# Patient Record
Sex: Female | Born: 1965
Health system: Southern US, Community
[De-identification: ages and names within clinical notes are randomized; demographics above are authoritative.]

## PROBLEM LIST (undated history)

## (undated) DIAGNOSIS — J189 Pneumonia, unspecified organism: Secondary | ICD-10-CM

## (undated) DIAGNOSIS — D649 Anemia, unspecified: Secondary | ICD-10-CM

## (undated) DIAGNOSIS — M069 Rheumatoid arthritis, unspecified: Secondary | ICD-10-CM

## (undated) DIAGNOSIS — O00109 Unspecified tubal pregnancy without intrauterine pregnancy: Secondary | ICD-10-CM

## (undated) DIAGNOSIS — A630 Anogenital (venereal) warts: Secondary | ICD-10-CM

## (undated) DIAGNOSIS — K219 Gastro-esophageal reflux disease without esophagitis: Secondary | ICD-10-CM

## (undated) DIAGNOSIS — H269 Unspecified cataract: Secondary | ICD-10-CM

## (undated) DIAGNOSIS — T7840XA Allergy, unspecified, initial encounter: Secondary | ICD-10-CM

## (undated) DIAGNOSIS — A64 Unspecified sexually transmitted disease: Secondary | ICD-10-CM

## (undated) DIAGNOSIS — R87619 Unspecified abnormal cytological findings in specimens from cervix uteri: Secondary | ICD-10-CM

## (undated) DIAGNOSIS — J45909 Unspecified asthma, uncomplicated: Secondary | ICD-10-CM

## (undated) DIAGNOSIS — F419 Anxiety disorder, unspecified: Secondary | ICD-10-CM

## (undated) DIAGNOSIS — F32A Depression, unspecified: Secondary | ICD-10-CM

## (undated) DIAGNOSIS — K649 Unspecified hemorrhoids: Secondary | ICD-10-CM

## (undated) DIAGNOSIS — D126 Benign neoplasm of colon, unspecified: Secondary | ICD-10-CM

## (undated) HISTORY — DX: Unspecified tubal pregnancy without intrauterine pregnancy: O00.109

## (undated) HISTORY — DX: Benign neoplasm of colon, unspecified: D12.6

## (undated) HISTORY — DX: Rheumatoid arthritis, unspecified: M06.9

## (undated) HISTORY — PX: POLYPECTOMY: SHX149

## (undated) HISTORY — DX: Depression, unspecified: F32.A

## (undated) HISTORY — DX: Unspecified cataract: H26.9

## (undated) HISTORY — PX: WISDOM TOOTH EXTRACTION: SHX21

## (undated) HISTORY — DX: Anxiety disorder, unspecified: F41.9

## (undated) HISTORY — DX: Anemia, unspecified: D64.9

## (undated) HISTORY — DX: Gastro-esophageal reflux disease without esophagitis: K21.9

## (undated) HISTORY — PX: ANKLE SURGERY: SHX546

## (undated) HISTORY — PX: COLONOSCOPY: SHX174

## (undated) HISTORY — DX: Allergy, unspecified, initial encounter: T78.40XA

## (undated) HISTORY — DX: Unspecified abnormal cytological findings in specimens from cervix uteri: R87.619

## (undated) HISTORY — DX: Unspecified asthma, uncomplicated: J45.909

## (undated) HISTORY — DX: Anogenital (venereal) warts: A63.0

## (undated) HISTORY — DX: Unspecified sexually transmitted disease: A64

## (undated) HISTORY — PX: HERNIA REPAIR: SHX51

## (undated) HISTORY — DX: Unspecified hemorrhoids: K64.9

## (undated) HISTORY — DX: Morbid (severe) obesity due to excess calories: E66.01

---

## 2003-06-03 ENCOUNTER — Encounter: Payer: Self-pay | Admitting: Rheumatology

## 2003-06-03 ENCOUNTER — Encounter: Admission: RE | Admit: 2003-06-03 | Discharge: 2003-06-03 | Payer: Self-pay | Admitting: Rheumatology

## 2003-10-17 DIAGNOSIS — M069 Rheumatoid arthritis, unspecified: Secondary | ICD-10-CM

## 2003-10-17 HISTORY — DX: Rheumatoid arthritis, unspecified: M06.9

## 2007-04-25 HISTORY — PX: TOTAL ABDOMINAL HYSTERECTOMY: SHX209

## 2007-04-25 HISTORY — PX: CHOLECYSTECTOMY: SHX55

## 2007-08-27 ENCOUNTER — Other Ambulatory Visit: Admission: RE | Admit: 2007-08-27 | Discharge: 2007-08-27 | Payer: Self-pay | Admitting: Obstetrics and Gynecology

## 2007-10-01 ENCOUNTER — Ambulatory Visit (HOSPITAL_BASED_OUTPATIENT_CLINIC_OR_DEPARTMENT_OTHER): Admission: RE | Admit: 2007-10-01 | Discharge: 2007-10-01 | Payer: Self-pay | Admitting: Obstetrics & Gynecology

## 2008-07-15 ENCOUNTER — Other Ambulatory Visit: Admission: RE | Admit: 2008-07-15 | Discharge: 2008-07-15 | Payer: Self-pay | Admitting: Obstetrics & Gynecology

## 2009-02-03 ENCOUNTER — Other Ambulatory Visit: Admission: RE | Admit: 2009-02-03 | Discharge: 2009-02-03 | Payer: Self-pay | Admitting: Obstetrics & Gynecology

## 2011-02-28 NOTE — Op Note (Signed)
NAMENATALLIE, RAVENSCROFT                ACCOUNT NO.:  [id number]   MEDICAL RECORD NO.:  [id number]          PATIENT TYPE:  AMB   LOCATION:  [facility]                         FACILITY:  [facility]   PHYSICIAN:  M. Leda Quail, MD  DATE OF BIRTH:  1966-07-17   DATE OF PROCEDURE:  10/01/2007  DATE OF DISCHARGE:                               OPERATIVE REPORT   PREOPERATIVE DIAGNOSES:  46. A 45 year old G2 P0, white female with vulvar vaginal condyloma.  2. Morbid obesity.  3. Rheumatoid arthritis.  4. History of total abdominal hysterectomy bilateral salpingo-      oophorectomy in July of this year secondary to a large benign      ovarian mass.   POSTOPERATIVE DIAGNOSES:  75. A 45 year old G2 P0, white female with vulvar vaginal condyloma.  2. Morbid obesity.  3. Rheumatoid arthritis.  4. History of total abdominal hysterectomy bilateral salpingo-      oophorectomy in July of this year secondary to a large benign      ovarian mass.   PROCEDURE:  CO2 laser of vaginal vulvar condyloma.   SURGEON:  M. Leda Quail, MD   ASSISTANT:  OR staff.   ANESTHESIA:  General endotracheal anesthesia.   FINDINGS:  Vaginal vulvar condyloma as described in the patient's office  notes, but specifically she has a small area of periurethral condyloma  on the right side.  It is not right on the urethra, it is separated  some.  She has several small areas of condyloma externally, and then a  patch of condyloma at the introitus.  Then she has some linear condyloma  vaginally all the way up to cuff where she has a second patch of  condyloma as well.   ESTIMATED BLOOD LOSS:  Minimal.   FLUIDS:  1000 mL of LR.   URINE OUTPUT:  50 mL.   COMPLICATIONS:  None.   INDICATIONS:  Ms. Nunzio Cobbs is a 45 year old white female who was seen after  many years in November.  On exam a thickened sort of whitish tissue was  noted vaginally and at the introitus.  This was biopsied, and it showed  condyloma.  Because of the  amount of the condyloma present.  I felt that  laser ablation initially was appropriate.  So the patient is probably  going to require future treatments, but hopefully we will be able to  minimize the amount of condyloma present; and then be able to proceed  with possibly vaginal Podophylin.  The patient has been counseled about  the risks and benefits, and is ready to proceed.   The patient was taken to the operating room.  She was placed in the  supine position.  Legs are positioned in the [place] stirrups.  An attempt  was made to place SCD compression devices because of her obesity.  But,  even after obtaining the bariatric SCDs this was unsuccessful.  A  decision was made to go ahead, and was placed in the lithotomy position  in the [place] stirrups, and the time of the clock was watched closely to  try and not exceed  45 minutes of surgery, if possible.   The patient's bladder was drained of urine, and then she was draped and  prepped in normal sterile fashion.  Wet and green towels were placed  around the vagina to help protect the skin.  Then the laser was attached  to the colposcope.  A painted speculum was used to visualize the  condyloma.  Acetic acid was applied to the vagina and the vulva for  several minutes.  Then the painted speculum with the suction attached  was placed vaginally.  The cuff was spread wide, and using a painting  motion with the laser tip, the condyloma cuff, and the vaginal sidewalls  were lasered.  A watt of 8 was used for the procedure.   Once the treatment of vaginal condyloma was completed, I moved to the  vulva.  Care was taken around the urethra to ensure that the laser did  not get any of the urethra.  There were several millimeters between the  urethra, and the condyloma that was removed.  Once the areas of  condyloma on the vulva were completely lasered, the procedure was ended.  The skin was cleansed with the Betadine prep.  The legs were placed  back  in the supine position.  Sponge, lap, and the instrument counts were  correct x2.  There were no needles used on the field.      Lum Keas, MD  Electronically Signed     MSM/MEDQ  D:  10/01/2007  T:  10/02/2007  Job:  [id number]

## 2011-07-21 LAB — I-STAT 8, (EC8 V) (CONVERTED LAB)
Acid-Base Excess: 2
Bicarbonate: 27.7 — ABNORMAL HIGH
Chloride: 106
HCT: 38
Hemoglobin: 12.9
Operator id: 268271
Potassium: 4.1
Sodium: 140
pCO2, Ven: 45
pH, Ven: 7.398 — ABNORMAL HIGH

## 2013-04-01 ENCOUNTER — Encounter: Payer: Self-pay | Admitting: Certified Nurse Midwife

## 2013-04-02 ENCOUNTER — Encounter: Payer: Self-pay | Admitting: Certified Nurse Midwife

## 2013-04-02 ENCOUNTER — Ambulatory Visit (INDEPENDENT_AMBULATORY_CARE_PROVIDER_SITE_OTHER): Payer: Self-pay | Admitting: Certified Nurse Midwife

## 2013-04-02 VITALS — BP 104/64 | HR 64 | Resp 16 | Ht 65.25 in | Wt 332.0 lb

## 2013-04-02 DIAGNOSIS — N951 Menopausal and female climacteric states: Secondary | ICD-10-CM

## 2013-04-02 DIAGNOSIS — Z0000 Encounter for general adult medical examination without abnormal findings: Secondary | ICD-10-CM

## 2013-04-02 DIAGNOSIS — Z01419 Encounter for gynecological examination (general) (routine) without abnormal findings: Secondary | ICD-10-CM

## 2013-04-02 LAB — POCT URINALYSIS DIPSTICK
Blood, UA: NEGATIVE
Glucose, UA: NEGATIVE
Ketones, UA: NEGATIVE
Protein, UA: NEGATIVE

## 2013-04-02 MED ORDER — ESTROGENS CONJUGATED 1.25 MG PO TABS: 1.2500 mg | ORAL_TABLET | Freq: Every day | ORAL | Status: DC

## 2013-04-02 NOTE — Progress Notes (Signed)
47 y.o. N5A2130 Single Caucasian Fe here for annual exam. Denies vaginal bleeding or dryness.  Patient was treated for pneumonia twice in past year.  Working on weight loss again with clean diet, reduction in processed foods. Denies any condyloma reoccurrence, but concerned partner may have some on his penis.  He has appointment to be checked. RA stable on medication.  Sees PCP for aex and all labs "normal per patient". Swelling legs the same no change.  No health issues today. No partner change, no STD concerns.  Patient's last menstrual period was 04/25/2007.          Sexually active: yes  The current method of family planning is status post hysterectomy.    Exercising: yes  weights & zumba Smoker:  no  Health Maintenance: Pap: 04/01/12 neg MMG:  12/13/11 normal Colonoscopy:  none BMD:   none TDaP:  2010 Labs: Poct urine-neg Self breast exam: monthly   reports that she has never smoked. She does not have any smokeless tobacco history on file. She reports that she drinks about 0.5 ounces of alcohol per week. She reports that she does not use illicit drugs.  Past Medical History  Diagnosis Date  . Morbid obesity   . Condyloma   . Rheumatoid arthritis 2005  . Tubal ectopic pregnancy     times 2  . STD (sexually transmitted disease)     + trich    Past Surgical History  Procedure Laterality Date  . Total abdominal hysterectomy  04/25/07    BSO  . Cholecystectomy  04/25/07    Current Outpatient Prescriptions  Medication Sig Dispense Refill  . Bumetanide (BUMEX PO) Take by mouth. occ      . Calcium Carbonate-Vitamin D (CALCIUM + D PO) Take by mouth daily.      Marland Kitchen CALCIUM PO Take by mouth.      . Cholecalciferol (VITAMIN D PO) Take by mouth daily.      Marland Kitchen CRANBERRY PO Take by mouth daily.      Marland Kitchen estrogens, conjugated, (PREMARIN) 1.25 MG tablet Take 1.25 mg by mouth daily.      Marland Kitchen Fexofenadine HCl (ALLEGRA PO) Take by mouth daily.      . Flaxseed, Linseed, (FLAXSEED OIL PO) Take by  mouth daily.      Marland Kitchen FOLIC ACID PO Take by mouth daily.      . methotrexate 1 G injection Inject into the vein every 7 (seven) days.      . Omega-3 Fatty Acids (FISH OIL PO) Take by mouth daily.      . Probiotic Product (PROBIOTIC PO) Take by mouth daily.       No current facility-administered medications for this visit.    Family History  Problem Relation Age of Onset  . Cancer Father   . Cirrhosis Father     ROS:  Pertinent items are noted in HPI.  Otherwise, a comprehensive ROS was negative.  Exam:   BP 104/64  Pulse 64  Resp 16  Ht 5' 5.25" (1.657 m)  Wt 332 lb (150.594 kg)  BMI 54.85 kg/m2  LMP 04/25/2007 Height: 5' 5.25" (165.7 cm)  Ht Readings from Last 3 Encounters:  04/02/13 5' 5.25" (1.657 m)    General appearance: alert, cooperative and appears stated age Head: Normocephalic, without obvious abnormality, atraumatic Neck: no adenopathy, supple, symmetrical, trachea midline and thyroid normal to inspection and palpation Lungs: clear to auscultation bilaterally Breasts: normal appearance, no masses or tenderness, No nipple retraction or dimpling,  No nipple discharge or bleeding, No axillary or supraclavicular adenopathy Heart: regular rate and rhythm Abdomen: soft, non-tender; no masses,  no organomegaly Extremities: extremities normal, atraumatic, no cyanosis or edema Skin: Skin color, texture, turgor normal. No rashes or lesions Lymph nodes: Cervical, supraclavicular, and axillary nodes normal. No abnormal inguinal nodes palpated Neurologic: Grossly normal   Pelvic: External genitalia:  no lesions              Urethra:  normal appearing urethra with no masses, tenderness or lesions              Bartholin's and Skene's: normal                 Vagina: normal appearing vagina with normal color and discharge, no lesions              Cervix: absent              Pap taken: no Bimanual Exam:  Uterus:  uterus absent              Adnexa: absent                Rectovaginal: Confirms               Anus:  normal sphincter tone, no lesions  A:  Well Woman with normal exam  S/P TAH with BSO for fibroids and cysts) on Premarin working well  Morbid obesity.  RA on stable medication with PCP  Lymphadema of legs  P: Reviewed health and wellness pertinent to exam  RX Premarin see order  Aware of health issues related to morbid obesity, does not want surgery. Continue to work with dieet and exercise as tolerated  Continue follow up as indicated.     Pap smear as per guidelines   mammogramyearly pap smear not taken today counseled on breast self exam, mammography screening, STD prevention, adequate intake of calcium and vitamin D, diet and exercise  return annually or prn  An After Visit Summary was printed and given to the patient.  Reviewed, TL

## 2013-04-02 NOTE — Patient Instructions (Addendum)

## 2013-04-21 ENCOUNTER — Telehealth: Payer: Self-pay | Admitting: Nurse Practitioner

## 2013-04-21 NOTE — Telephone Encounter (Signed)
Pt's boyfriend was diagnosed with something and she is not sure if she needs to come in to be seen

## 2013-04-21 NOTE — Telephone Encounter (Signed)
Left message on CB# Voice Mail of need to return call for more information and schedule office visit. sue

## 2013-04-22 NOTE — Telephone Encounter (Signed)
See note below and Office note in EPIC for 04/02/2013. Patient states that her boyfriend has gone to his doctor and was checked and was told he had Genital Warts . Patient is calling to let you know this information and what should she do if any Rx for her. Please advise.

## 2013-04-24 NOTE — Telephone Encounter (Signed)
Spoke with pt about Sarah Dean recommendation to come in for office visit to discuss /exam But if you have no noted areas on yourself then there is nothing to treat at this point. Pt was ok with this. Pt will keep A close check and if any areas of concern appear, she will call to schedule an appointment.

## 2013-04-24 NOTE — Telephone Encounter (Signed)
If patient has not noted any areas on herself there is nothing to treat at this point.  I will be glad to have her come in for OV exam to recheck and discuss.

## 2014-04-08 ENCOUNTER — Encounter: Payer: Self-pay | Admitting: Certified Nurse Midwife

## 2014-04-08 ENCOUNTER — Ambulatory Visit (INDEPENDENT_AMBULATORY_CARE_PROVIDER_SITE_OTHER): Payer: 59 | Admitting: Certified Nurse Midwife

## 2014-04-08 VITALS — BP 120/64 | HR 64 | Resp 16 | Ht 65.0 in | Wt 344.0 lb

## 2014-04-08 DIAGNOSIS — Z01419 Encounter for gynecological examination (general) (routine) without abnormal findings: Secondary | ICD-10-CM

## 2014-04-08 DIAGNOSIS — Z0000 Encounter for general adult medical examination without abnormal findings: Secondary | ICD-10-CM

## 2014-04-08 DIAGNOSIS — N951 Menopausal and female climacteric states: Secondary | ICD-10-CM

## 2014-04-08 MED ORDER — ESTROGENS CONJUGATED 1.25 MG PO TABS: 1.2500 mg | ORAL_TABLET | Freq: Every day | ORAL | Status: DC

## 2014-04-08 NOTE — Progress Notes (Signed)
48 y.o. G32P0020 Married Caucasian Fe here for annual exam.  Happily married now! Denies hot flashes or night sweats. Premarin working well. Patient seeing rheumatologist for RA. Lymphedema no change. Liver profile labs only at MD.office. PCP prn. Patient realizes weight has increased since marriage. Spouse and she are considering weight loss program. Patient is not interested in surgery at this point. No health issues today. Spouse works for [employer].  Patient's last menstrual period was 04/25/2007.          Sexually active: yes  The current method of family planning is status post hysterectomy.    Exercising: yes  zumba 3 times a week & gardening Smoker:  no  Health Maintenance: Pap:  03-22-12 neg MMG: 01-14-14 normal Colonoscopy:  none BMD:   none TDaP:  2010 Labs: none Self breast exam: done monthly   reports that she has quit smoking. She does not have any smokeless tobacco history on file. She reports that she drinks about one ounce of alcohol per week. She reports that she does not use illicit drugs.  Past Medical History  Diagnosis Date  . Morbid obesity   . Condyloma   . Rheumatoid arthritis 2005  . Tubal ectopic pregnancy     times 2  . STD (sexually transmitted disease)     + trich    Past Surgical History  Procedure Laterality Date  . Total abdominal hysterectomy  04/25/07    BSO  . Cholecystectomy  04/25/07    Current Outpatient Prescriptions  Medication Sig Dispense Refill  . Bumetanide (BUMEX PO) Take by mouth. occ      . Calcium Carbonate-Vitamin D (CALCIUM + D PO) Take by mouth daily.      . cetirizine (ZYRTEC) 10 MG tablet Take 10 mg by mouth daily.      . Cholecalciferol (VITAMIN D PO) Take by mouth daily.      Marland Kitchen CRANBERRY PO Take by mouth daily.      Marland Kitchen estrogens, conjugated, (PREMARIN) 1.25 MG tablet Take 1 tablet (1.25 mg total) by mouth daily.  30 tablet  12  . Flaxseed, Linseed, (FLAXSEED OIL PO) Take by mouth daily.      Marland Kitchen FOLIC ACID PO Take by mouth daily.       . methotrexate 1 G injection Inject into the vein every 7 (seven) days.      . milk thistle 175 MG tablet Take 175 mg by mouth as needed.      . Omega-3 Fatty Acids (FISH OIL PO) Take by mouth daily.      . Probiotic Product (PROBIOTIC PO) Take by mouth daily.       No current facility-administered medications for this visit.    Family History  Problem Relation Age of Onset  . Cancer Father   . Cirrhosis Father     ROS:  Pertinent items are noted in HPI.  Otherwise, a comprehensive ROS was negative.  Exam:   BP 120/64  Pulse 64  Resp 16  Ht 5\' 5"  (1.651 m)  Wt 344 lb (156.037 kg)  BMI 57.24 kg/m2  LMP 04/25/2007 Height: 5\' 5"  (165.1 cm)  Ht Readings from Last 3 Encounters:  04/08/14 5\' 5"  (1.651 m)  04/02/13 5' 5.25" (1.657 m)    General appearance: alert, cooperative and appears stated age Head: Normocephalic, without obvious abnormality, atraumatic Neck: no adenopathy, supple, symmetrical, trachea midline and thyroid normal to inspection and palpation and non-palpable Lungs: clear to auscultation bilaterally Breasts: normal appearance, no masses  or tenderness, No nipple retraction or dimpling, No nipple discharge or bleeding, No axillary or supraclavicular adenopathy Heart: regular rate and rhythm Abdomen: soft, non-tender; no masses,  no organomegaly Extremities: extremities normal, atraumatic, no cyanosis or edema Skin: Skin color, texture, turgor normal. No rashes or lesions Lymph nodes: Cervical, supraclavicular, and axillary nodes normal. No abnormal inguinal nodes palpated Neurologic: Grossly normal   Pelvic: External genitalia:  no lesions              Urethra:  normal appearing urethra with no masses, tenderness or lesions              Bartholin's and Skene's: normal                 Vagina: normal appearing vagina with normal color and discharge, no lesions              Cervix: absent              Pap taken: no Bimanual Exam:  Uterus:  uterus absent               Adnexa: no mass, fullness, tenderness and adnexa absent               Rectovaginal: Confirms               Anus:  normal sphincter tone, no lesions  A:  Well Woman with normal exam  S/P TAH with bilateral S and O on [name] working well  RA under stable medication with MD  Lymphedema no change  Screening Labs  Morbid obesity  P:   Reviewed health and wellness pertinent to exam  Continue follow up as indicated  Labs: Lipid panel, TSH, Vitamin D Hgb A1-c  Discussed using the [employer] weight loss program because her spouse is employee she has option to participate. Patient will inquire and consider. Encouraged to work on weight loss to prevent other health issues. Discussed surgical option, patient not interested.  Pap smear not taken today   counseled on breast self exam, mammography screening, adequate intake of calcium and vitamin D, diet and exercise  return annually or prn  An After Visit Summary was printed and given to the patient.

## 2014-04-08 NOTE — Patient Instructions (Signed)

## 2014-04-09 ENCOUNTER — Telehealth: Payer: Self-pay

## 2014-04-09 LAB — HEMOGLOBIN A1C
Hgb A1c MFr Bld: 5 % (ref <5.7)
Mean Plasma Glucose: 97 mg/dL (ref <117)

## 2014-04-09 LAB — LIPID PANEL
Cholesterol: 147 mg/dL (ref 0–200)
HDL: 57 mg/dL (ref >39)
LDL CALC: 60 mg/dL (ref 0–99)
TRIGLYCERIDES: 149 mg/dL (ref <150)
Total CHOL/HDL Ratio: 2.6 Ratio
VLDL: 30 mg/dL (ref 0–40)

## 2014-04-09 LAB — VITAMIN D 25 HYDROXY (VIT D DEFICIENCY, FRACTURES): Vit D, 25-Hydroxy: 63 ng/mL (ref 30–89)

## 2014-04-09 LAB — TSH: TSH: 3.089 u[IU]/mL (ref 0.350–4.500)

## 2014-04-09 NOTE — Telephone Encounter (Signed)
Returning call.

## 2014-04-09 NOTE — Telephone Encounter (Signed)
lmtcb

## 2014-04-09 NOTE — Telephone Encounter (Signed)
Patient notified of results as written by provider 

## 2014-04-09 NOTE — Telephone Encounter (Signed)
Message copied by Susy Manor on Thu Apr 09, 2014  3:21 PM ------      Message from: Regina Eck      Created: Thu Apr 09, 2014  8:13 AM       reviewed lab: Lipid panel great, Hgb A1-c, Vitamin D,TSH all normal      Notify patient ------

## 2014-04-13 NOTE — Progress Notes (Signed)
Reviewed personally.  M. Suzanne Miller, MD.  

## 2014-04-22 ENCOUNTER — Telehealth: Payer: Self-pay | Admitting: Certified Nurse Midwife

## 2014-04-22 NOTE — Telephone Encounter (Signed)
Pt checked with pharmacy [facility] pharmacy) and do not have the refill for her premarin.

## 2014-04-22 NOTE — Telephone Encounter (Addendum)
04/08/14 #30/12 refills was sent to [place]  S/w Caryl Pina with pharmacy she said they do have a refill ready for the patient she must've called from a old prescription bottle but they do have the prescription for her.  Notified patient.  Routed to provider for review, encounter closed.

## 2014-08-17 ENCOUNTER — Encounter: Payer: Self-pay | Admitting: Certified Nurse Midwife

## 2014-12-23 ENCOUNTER — Encounter: Payer: Self-pay | Admitting: Internal Medicine

## 2015-01-01 ENCOUNTER — Ambulatory Visit (INDEPENDENT_AMBULATORY_CARE_PROVIDER_SITE_OTHER): Payer: 59 | Admitting: Gastroenterology

## 2015-01-01 ENCOUNTER — Encounter: Payer: Self-pay | Admitting: Gastroenterology

## 2015-01-01 VITALS — BP 110/72 | HR 64 | Temp 97.1°F | Ht 66.0 in | Wt 343.6 lb

## 2015-01-01 DIAGNOSIS — K6289 Other specified diseases of anus and rectum: Secondary | ICD-10-CM | POA: Diagnosis not present

## 2015-01-01 NOTE — Patient Instructions (Signed)
[facility] is making a special cream to use twice a day for 2 weeks. This has nitroglycerin in it, so you need to wear gloves when applying to your rectum. Wash your hands afterward.   Please call me with an update on Monday.   We need to see you back in 2 weeks.

## 2015-01-01 NOTE — Progress Notes (Signed)
Primary Care Physician:  Monico Blitz, MD Primary Gastroenterologist:  Dr. Oneida Alar  Chief Complaint  Patient presents with  . Constipation  . hemorroid    HPI:   Sarah Dean is a 49 y.o. female presenting today at the request of Dr. [name] secondary to hemorrhoids, rectal pain. Presents as an urgent work-in.    Had issues with constipation after taking pain meds. Felt like a knife was stuck up her bottom. Thought maybe had an anal fissure, thought maybe had a tear. Significant rectal discomfort. Prescribed Lidocaine to help with pain. Medicating every 2 hours. Feels like a knife-like pain with BM. Suppository is painful. No rectal bleeding. Feels like rectum is swollen, "hanging out". Constipation resolved now. Feels like she has to have a BM all the time. Will go and have just a little come out, then gas. Gas bubble coming out of rectum is painful as well.    Past Medical History  Diagnosis Date  . Morbid obesity   . Condyloma   . Rheumatoid arthritis 2005  . Tubal ectopic pregnancy     times 2  . STD (sexually transmitted disease)     + trich    Past Surgical History  Procedure Laterality Date  . Total abdominal hysterectomy  04/25/07    BSO  . Cholecystectomy  04/25/07  . Ankle surgery      Current Outpatient Prescriptions  Medication Sig Dispense Refill  . Adalimumab (HUMIRA PEN [place]) Inject into the skin every 21 ( twenty-one) days.    . Bumetanide (BUMEX PO) Take by mouth. occ    . Calcium Carbonate-Vitamin D (CALCIUM + D PO) Take by mouth daily.    . cetirizine (ZYRTEC) 10 MG tablet Take 10 mg by mouth daily.    . Cholecalciferol (VITAMIN D PO) Take by mouth daily.    Marland Kitchen CRANBERRY PO Take by mouth daily.    Marland Kitchen estrogens, conjugated, (PREMARIN) 1.25 MG tablet Take 1 tablet (1.25 mg total) by mouth daily. 30 tablet 12  . Flaxseed, Linseed, (FLAXSEED OIL PO) Take by mouth daily.    Marland Kitchen FOLIC ACID PO Take by mouth daily.    . methotrexate 1 G injection Inject  into the vein every 7 (seven) days.    . milk thistle 175 MG tablet Take 175 mg by mouth as needed.    . Omega-3 Fatty Acids (FISH OIL PO) Take by mouth daily.    . Probiotic Product (PROBIOTIC PO) Take by mouth daily.     No current facility-administered medications for this visit.    Allergies as of 01/01/2015  . (No Known Allergies)    Family History  Problem Relation Age of Onset  . Cancer Father   . Cirrhosis Father   . Colon cancer Neg Hx     History   Social History  . Marital Status: Married    Spouse Name: N/A  . Number of Children: N/A  . Years of Education: N/A   Occupational History  . Not on file.   Social History Main Topics  . Smoking status: Former Research scientist (life sciences)  . Smokeless tobacco: Not on file  . Alcohol Use: No  . Drug Use: No  . Sexual Activity:    Partners: Male    Birth Control/ Protection: Surgical     Comment: TAH   Other Topics Concern  . Not on file   Social History Narrative    Review of Systems: As mentioned in HPI.  Physical Exam: BP  110/72 mmHg  Pulse 64  Temp(Src) 97.1 F (36.2 C)  Ht 5\' 6"  (1.676 m)  Wt 343 lb 9.6 oz (155.856 kg)  BMI 55.48 kg/m2  LMP 04/25/2007 General:   Alert and oriented. Pleasant and cooperative. Well-nourished and well-developed.  Head:  Normocephalic and atraumatic. Eyes:  Without icterus, sclera clear and conjunctiva pink.  Ears:  Normal auditory acuity. Nose:  No deformity, discharge,  or lesions. Mouth:  No deformity or lesions, oral mucosa pink.  Lungs:  Clear to auscultation bilaterally. No wheezes, rales, or rhonchi. No distress.  Heart:  S1, S2 present without murmurs appreciated.  Abdomen:  +BS, soft, non-tender and non-distended. No HSM noted. No guarding or rebound. No masses appreciated.  Rectal:  External exam without thrombosed hemorrhoids. Query occult anal fissure. Mild stooling of fecal matter, no gross blood. No internal exam performed.  Msk:  Symmetrical without gross deformities.  Normal posture. Extremities:  Without edema. Neurologic:  Alert and  oriented x4;  grossly normal neurologically. Skin:  Intact without significant lesions or rashes. Psych:  Alert and cooperative. Normal mood and affect.

## 2015-01-04 ENCOUNTER — Telehealth: Payer: Self-pay | Admitting: Internal Medicine

## 2015-01-04 NOTE — Telephone Encounter (Signed)
PATIENT CALLED AND STATED THAT THE CREAM IS WORKING ON THE HEMORRHOID PAIN BUT CAUSING HEADACHE.  ALSO A LITTLE DIZZINESS.  PLEASE ADVISE [phone]

## 2015-01-04 NOTE — Telephone Encounter (Signed)
Take tylenol as needed for headache, do not exceed recommended dosing on bottle. Wear gloves, wash hands afterward. Let me know if that helps.

## 2015-01-05 NOTE — Telephone Encounter (Signed)
Pt is aware and will try it and let us know if she continues to have problems.

## 2015-01-12 NOTE — Assessment & Plan Note (Signed)
49 year old female with likely anal fissure. Needs treatment with nitroglycerin cream in addition to hydrocortisone cream. Will have [facility] compound the hemorrhoid cream that includes lidocaine, add nitroglycerin, and hydrocortisone. Apply BID for 2-3 weeks. Return in 2 weeks for close follow-up. No colonoscopy indicated unless worsening or failure to improve. No evidence of rectal bleeding noted.

## 2015-01-13 ENCOUNTER — Ambulatory Visit (INDEPENDENT_AMBULATORY_CARE_PROVIDER_SITE_OTHER): Payer: 59 | Admitting: Gastroenterology

## 2015-01-13 ENCOUNTER — Encounter: Payer: Self-pay | Admitting: Gastroenterology

## 2015-01-13 VITALS — BP 126/82 | HR 104 | Temp 97.0°F | Ht 65.0 in | Wt 341.8 lb

## 2015-01-13 DIAGNOSIS — K6289 Other specified diseases of anus and rectum: Secondary | ICD-10-CM | POA: Diagnosis not present

## 2015-01-13 NOTE — Patient Instructions (Signed)
I sent in a different cream to the pharmacy. They should be able to show you on the applicator how much would quantify as an "inch" into rectum.   We will see you back in 4 weeks! Please call if you have any worsening of symptoms.

## 2015-01-13 NOTE — Progress Notes (Signed)
cc'ed to pcp °

## 2015-01-14 ENCOUNTER — Ambulatory Visit: Payer: Self-pay | Admitting: Gastroenterology

## 2015-01-21 NOTE — Progress Notes (Signed)
Referring Provider: Monico Blitz, MD Primary Care Physician:  Monico Blitz, MD  Primary GI: Dr. Oneida Alar   Chief Complaint  Patient presents with  . Follow-up    HPI:   Sarah Dean is a 49 y.o. female presenting today with a history of likely anal fissure. Early interval follow-up from 2 weeks ago. Prescribed [facility] ointment with nitro compounded at last appt. Doing significantly better. Rectal discomfort nearly resolved. No itching. No further fecal incontinence. NO rectal bleeding. Issues with nitro, noting significant dizziness and headache despite supportive measures.   Past Medical History  Diagnosis Date  . Morbid obesity   . Condyloma   . Rheumatoid arthritis 2005  . Tubal ectopic pregnancy     times 2  . STD (sexually transmitted disease)     + trich    Past Surgical History  Procedure Laterality Date  . Total abdominal hysterectomy  04/25/07    BSO  . Cholecystectomy  04/25/07  . Ankle surgery      Current Outpatient Prescriptions  Medication Sig Dispense Refill  . Adalimumab (HUMIRA PEN [place]) Inject into the skin every 21 ( twenty-one) days.    . Bumetanide (BUMEX PO) Take by mouth. occ    . Calcium Carbonate-Vitamin D (CALCIUM + D PO) Take by mouth daily.    . cetirizine (ZYRTEC) 10 MG tablet Take 10 mg by mouth daily.    . Cholecalciferol (VITAMIN D PO) Take by mouth daily.    Marland Kitchen CRANBERRY PO Take by mouth daily.    Marland Kitchen estrogens, conjugated, (PREMARIN) 1.25 MG tablet Take 1 tablet (1.25 mg total) by mouth daily. 30 tablet 12  . Flaxseed, Linseed, (FLAXSEED OIL PO) Take by mouth daily.    Marland Kitchen FOLIC ACID PO Take by mouth daily.    . methotrexate 1 G injection Inject into the vein every 7 (seven) days.    . milk thistle 175 MG tablet Take 175 mg by mouth as needed.    . Omega-3 Fatty Acids (FISH OIL PO) Take by mouth daily.    . Probiotic Product (PROBIOTIC PO) Take by mouth daily.     No current facility-administered medications for this visit.      Allergies as of 01/13/2015  . (No Known Allergies)    Family History  Problem Relation Age of Onset  . Cancer Father   . Cirrhosis Father   . Colon cancer Neg Hx     History   Social History  . Marital Status: Married    Spouse Name: N/A  . Number of Children: N/A  . Years of Education: N/A   Social History Main Topics  . Smoking status: Former Research scientist (life sciences)  . Smokeless tobacco: Not on file  . Alcohol Use: No  . Drug Use: No  . Sexual Activity:    Partners: Male    Birth Control/ Protection: Surgical     Comment: TAH   Other Topics Concern  . None   Social History Narrative    Review of Systems: As mentioned in HPI  Physical Exam: BP 126/82 mmHg  Pulse 104  Temp(Src) 97 F (36.1 C)  Ht 5\' 5"  (1.651 m)  Wt 341 lb 12.8 oz (155.039 kg)  BMI 56.88 kg/m2  LMP 04/25/2007 General:   Alert and oriented. No distress noted. Pleasant and cooperative.  Head:  Normocephalic and atraumatic. Eyes:  Conjuctiva clear without scleral icterus. Abdomen:  +BS, soft, obese, non-tender and non-distended. No rebound or guarding. No HSM or masses noted.  Rectal: small external hemorrhoids non-thrombosed, no obvious anal fissure, no stooling. Significantly improved from last visit. Internal exam with likely anterior small internal hemorrhoid.  Extremities:  lymphedema Neurologic:  Alert and  oriented x4;  grossly normal neurologically. Skin:  Intact without significant lesions or rashes. Psych:  Alert and cooperative. Normal mood and affect.

## 2015-01-21 NOTE — Assessment & Plan Note (Signed)
Likely secondary to occult anal fissure. Significant improvement noted with nitro and [facility] cream. Unable to tolerate nitro well. Will change to diltiazem compounded in [place] [facility] cream. Return in 4 weeks. No colonoscopy indicated unless no significant improvement or evidence of rectal bleeding.

## 2015-01-22 NOTE — Progress Notes (Signed)
cc'ed to pcp °

## 2015-02-02 ENCOUNTER — Other Ambulatory Visit: Payer: Self-pay | Admitting: Certified Nurse Midwife

## 2015-02-02 ENCOUNTER — Telehealth: Payer: Self-pay | Admitting: Internal Medicine

## 2015-02-02 NOTE — Telephone Encounter (Signed)
Medication refill request: Premarin  Last AEX:  04/08/14 DL Next AEX: 04/14/15 DL Last MMG (if hormonal medication request): 01/14/14 [id number]:Neg Refill authorized: 04/08/14 #30 tablets / 12 Refills to [place].

## 2015-02-02 NOTE — Telephone Encounter (Signed)
Routing to AS 

## 2015-02-02 NOTE — Telephone Encounter (Signed)
Pt called today saying that [facility] had put her on a special type of cream and she is running low and needs it before the weekend because she is going out of town. I told her to call her pharmacy and send [place] a refill request. She was saying that she would be in trouble if she ran out and wanted [facility] to call it in. I told her [facility] was seeing patients and that we ask for the patients to call their pharmacy if they are needing a refill. She said that she would try to reach someone at her pharmacy.

## 2015-02-02 NOTE — Telephone Encounter (Signed)
I called in the [place] [facility] cream with diltiazem gel added. Spoke with Nicki Reaper the pharmacist. Patient can check later today with them.

## 2015-02-16 ENCOUNTER — Encounter: Payer: Self-pay | Admitting: Gastroenterology

## 2015-02-16 ENCOUNTER — Ambulatory Visit (INDEPENDENT_AMBULATORY_CARE_PROVIDER_SITE_OTHER): Payer: 59 | Admitting: Gastroenterology

## 2015-02-16 VITALS — BP 119/78 | HR 88 | Temp 98.1°F | Ht 66.0 in | Wt 351.8 lb

## 2015-02-16 DIAGNOSIS — K6289 Other specified diseases of anus and rectum: Secondary | ICD-10-CM | POA: Diagnosis not present

## 2015-02-16 NOTE — Patient Instructions (Signed)
I will talk with Dr. Oneida Alar about the best route to go next.   Continue the cream for now. I will be in touch soon!

## 2015-02-16 NOTE — Progress Notes (Signed)
Referring Provider: Monico Blitz, MD Primary Care Physician:  Monico Blitz, MD  Primary GI: Dr. Oneida Alar   Chief Complaint  Patient presents with  . Hemorrhoids    HPI:   Sarah Dean is a 49 y.o. female presenting today with a history of likely anal fissure. Originally seen in march 2016 and prescribed [facility] cream with nitro compounded. Unable to tolerate nitro, so she was given another prescription with diltiazem compound. Overall, she has improved.   Cream burns after BM. No current rectal bleeding. No constipation or diarrhea. However, doesn't feel regular since symptoms started. Gets really itchy after having a BM. Remote hematochezia.   Past Medical History  Diagnosis Date  . Morbid obesity   . Condyloma   . Rheumatoid arthritis 2005  . Tubal ectopic pregnancy     times 2  . STD (sexually transmitted disease)     + trich    Past Surgical History  Procedure Laterality Date  . Total abdominal hysterectomy  04/25/07    BSO  . Cholecystectomy  04/25/07  . Ankle surgery      Current Outpatient Prescriptions  Medication Sig Dispense Refill  . Adalimumab (HUMIRA PEN [place]) Inject into the skin every 21 ( twenty-one) days.    . Bumetanide (BUMEX PO) Take by mouth. occ    . Calcium Carbonate-Vitamin D (CALCIUM + D PO) Take by mouth daily.    . cetirizine (ZYRTEC) 10 MG tablet Take 10 mg by mouth daily.    . Cholecalciferol (VITAMIN D PO) Take by mouth daily.    Marland Kitchen CRANBERRY PO Take by mouth daily.    Marland Kitchen estrogens, conjugated, (PREMARIN) 1.25 MG tablet Take 1 tablet (1.25 mg total) by mouth daily. 30 tablet 12  . Flaxseed, Linseed, (FLAXSEED OIL PO) Take by mouth daily.    Marland Kitchen FOLIC ACID PO Take by mouth daily.    . methotrexate 1 G injection Inject into the vein every 7 (seven) days.    . milk thistle 175 MG tablet Take 175 mg by mouth as needed.    . Omega-3 Fatty Acids (FISH OIL PO) Take by mouth daily.    . Probiotic Product (PROBIOTIC PO) Take by mouth  daily.     No current facility-administered medications for this visit.    Allergies as of 02/16/2015  . (No Known Allergies)    Family History  Problem Relation Age of Onset  . Cancer Father   . Cirrhosis Father   . Colon cancer Neg Hx     History   Social History  . Marital Status: Married    Spouse Name: N/A  . Number of Children: N/A  . Years of Education: N/A   Social History Main Topics  . Smoking status: Former Research scientist (life sciences)  . Smokeless tobacco: Not on file  . Alcohol Use: No  . Drug Use: No  . Sexual Activity:    Partners: Male    Birth Control/ Protection: Surgical     Comment: TAH   Other Topics Concern  . None   Social History Narrative    Review of Systems: As mentioned in HPI  Physical Exam: BP 119/78 mmHg  Pulse 88  Temp(Src) 98.1 F (36.7 C) (Oral)  Ht 5\' 6"  (1.676 m)  Wt 351 lb 12.8 oz (159.575 kg)  BMI 56.81 kg/m2  LMP 04/25/2007 General:   Alert and oriented. No distress noted. Pleasant and cooperative.  Head:  Normocephalic and atraumatic. Eyes:  Conjuctiva clear without scleral icterus. Mouth:  Oral mucosa pink and moist. Good dentition. No lesions. Heart:  S1, S2 present without murmurs, rubs, or gallops. Regular rate and rhythm. Abdomen:  +BS, soft, non-tender and non-distended. No rebound or guarding. No HSM or masses noted. Msk:  Symmetrical without gross deformities. Normal posture. Extremities:  Chronic lymphedema  Neurologic:  Alert and  oriented x4;  grossly normal neurologically. Skin:  Intact without significant lesions or rashes. Psych:  Alert and cooperative. Normal mood and affect.

## 2015-02-17 NOTE — Progress Notes (Addendum)
REVIEWED-NEEDS COMPLETE TCS WITH PHENERGAN 12.5 MG IV IN PREOP OR MAC.

## 2015-02-17 NOTE — Progress Notes (Signed)
REVIEWED-NO ADDITIONAL RECOMMENDATIONS. 

## 2015-02-18 ENCOUNTER — Telehealth: Payer: Self-pay | Admitting: Gastroenterology

## 2015-02-18 ENCOUNTER — Other Ambulatory Visit: Payer: Self-pay

## 2015-02-18 DIAGNOSIS — K648 Other hemorrhoids: Secondary | ICD-10-CM

## 2015-02-18 DIAGNOSIS — K6289 Other specified diseases of anus and rectum: Secondary | ICD-10-CM

## 2015-02-18 MED ORDER — PEG-KCL-NACL-NASULF-NA ASC-C 100 G PO SOLR: 1.0000 | ORAL | Status: DC

## 2015-02-18 NOTE — Telephone Encounter (Signed)
Pt is scheduled for June 8th and she is aware and instruction are in the mail

## 2015-02-18 NOTE — Telephone Encounter (Signed)
Per Dr. Oneida Alar, needs colonoscopy. Please arrange for TCS with possible banding.

## 2015-02-19 NOTE — Assessment & Plan Note (Signed)
49 year old female with likely anal fissure, treated with [place] [facility] hemorrhoid cream/diltiazem due to inability to tolerate nitro. No current rectal bleeding but remote history of hematochezia. Overall, she has improved since first visit but still with slow improvement. Discussed with Dr. Oneida Alar. Proceed with colonoscopy in near future.   Proceed with colonoscopy with Dr. Oneida Alar in the near future. The risks, benefits, and alternatives have been discussed in detail with the patient. They state understanding and desire to proceed.  Possible hemorrhoid banding at time of procedure if appropriate.

## 2015-02-24 NOTE — Progress Notes (Signed)
CC'ED TO PCP 

## 2015-03-04 ENCOUNTER — Other Ambulatory Visit: Payer: Self-pay

## 2015-03-04 DIAGNOSIS — K6289 Other specified diseases of anus and rectum: Secondary | ICD-10-CM

## 2015-03-23 ENCOUNTER — Telehealth: Payer: Self-pay | Admitting: General Practice

## 2015-03-23 NOTE — Telephone Encounter (Signed)
Patient called in to inform us they she is not sure if she can do the Enema for her tcs prep.  She stated she will try.

## 2015-03-24 ENCOUNTER — Ambulatory Visit (HOSPITAL_COMMUNITY): Admit: 2015-03-24 | Payer: 59 | Admitting: Gastroenterology

## 2015-03-24 ENCOUNTER — Encounter (HOSPITAL_COMMUNITY)
Admission: RE | Disposition: A | Discharge status: Home / Self Care | Payer: Self-pay | Source: Ambulatory Visit | Attending: Gastroenterology

## 2015-03-24 ENCOUNTER — Ambulatory Visit (HOSPITAL_COMMUNITY)
Admission: RE | Admit: 2015-03-24 | Discharge: 2015-03-24 | Disposition: A | Discharge status: Home / Self Care | Payer: 59 | Source: Ambulatory Visit | Attending: Gastroenterology | Admitting: Gastroenterology

## 2015-03-24 ENCOUNTER — Encounter (HOSPITAL_COMMUNITY): Payer: Self-pay

## 2015-03-24 ENCOUNTER — Encounter (HOSPITAL_COMMUNITY): Payer: Self-pay | Admitting: *Deleted

## 2015-03-24 DIAGNOSIS — Z79890 Hormone replacement therapy (postmenopausal): Secondary | ICD-10-CM | POA: Diagnosis not present

## 2015-03-24 DIAGNOSIS — K5730 Diverticulosis of large intestine without perforation or abscess without bleeding: Secondary | ICD-10-CM | POA: Insufficient documentation

## 2015-03-24 DIAGNOSIS — K644 Residual hemorrhoidal skin tags: Secondary | ICD-10-CM | POA: Diagnosis not present

## 2015-03-24 DIAGNOSIS — D124 Benign neoplasm of descending colon: Secondary | ICD-10-CM | POA: Diagnosis not present

## 2015-03-24 DIAGNOSIS — Z9049 Acquired absence of other specified parts of digestive tract: Secondary | ICD-10-CM | POA: Diagnosis not present

## 2015-03-24 DIAGNOSIS — K649 Unspecified hemorrhoids: Secondary | ICD-10-CM

## 2015-03-24 DIAGNOSIS — K6289 Other specified diseases of anus and rectum: Secondary | ICD-10-CM | POA: Diagnosis not present

## 2015-03-24 DIAGNOSIS — K648 Other hemorrhoids: Secondary | ICD-10-CM | POA: Insufficient documentation

## 2015-03-24 DIAGNOSIS — M6289 Other specified disorders of muscle: Secondary | ICD-10-CM | POA: Diagnosis not present

## 2015-03-24 DIAGNOSIS — Z6841 Body Mass Index (BMI) 40.0 and over, adult: Secondary | ICD-10-CM | POA: Diagnosis not present

## 2015-03-24 DIAGNOSIS — K625 Hemorrhage of anus and rectum: Secondary | ICD-10-CM | POA: Diagnosis present

## 2015-03-24 DIAGNOSIS — Z87891 Personal history of nicotine dependence: Secondary | ICD-10-CM | POA: Diagnosis not present

## 2015-03-24 DIAGNOSIS — K921 Melena: Secondary | ICD-10-CM

## 2015-03-24 DIAGNOSIS — Z79899 Other long term (current) drug therapy: Secondary | ICD-10-CM | POA: Insufficient documentation

## 2015-03-24 DIAGNOSIS — M069 Rheumatoid arthritis, unspecified: Secondary | ICD-10-CM | POA: Insufficient documentation

## 2015-03-24 HISTORY — PX: COLONOSCOPY: SHX5424

## 2015-03-24 HISTORY — PX: HEMORRHOID BANDING: SHX5850

## 2015-03-24 SURGERY — COLONOSCOPY
Anesthesia: Moderate Sedation

## 2015-03-24 MED ORDER — PROMETHAZINE HCL 25 MG/ML IJ SOLN
INTRAMUSCULAR | Status: AC
  Filled 2015-03-24: qty 1

## 2015-03-24 MED ORDER — SODIUM CHLORIDE 0.9 % IV SOLN
INTRAVENOUS | Status: DC
  Administered 2015-03-24: 09:00:00 via INTRAVENOUS

## 2015-03-24 MED ORDER — LIDOCAINE HCL 2 % EX GEL
CUTANEOUS | Status: AC
  Administered 2015-03-24: 1 via RECTAL
  Filled 2015-03-24: qty 30

## 2015-03-24 MED ORDER — MEPERIDINE HCL 100 MG/ML IJ SOLN
INTRAMUSCULAR | Status: DC | PRN
  Administered 2015-03-24 (×2): 25 mg via INTRAVENOUS
  Administered 2015-03-24: 50 mg via INTRAVENOUS

## 2015-03-24 MED ORDER — MEPERIDINE HCL 100 MG/ML IJ SOLN
INTRAMUSCULAR | Status: AC
  Filled 2015-03-24: qty 2

## 2015-03-24 MED ORDER — PROMETHAZINE HCL 25 MG/ML IJ SOLN
12.5000 mg | Freq: Once | INTRAMUSCULAR | Status: AC
Start: 2015-03-24 — End: 2015-03-24
  Administered 2015-03-24: 12.5 mg via INTRAVENOUS

## 2015-03-24 MED ORDER — STERILE WATER FOR IRRIGATION IR SOLN
Status: DC | PRN
  Administered 2015-03-24: 10:00:00

## 2015-03-24 MED ORDER — SODIUM CHLORIDE 0.9 % IJ SOLN
INTRAMUSCULAR | Status: AC
  Filled 2015-03-24: qty 3

## 2015-03-24 MED ORDER — MIDAZOLAM HCL 5 MG/5ML IJ SOLN
INTRAMUSCULAR | Status: AC
  Filled 2015-03-24: qty 10

## 2015-03-24 MED ORDER — TRAMADOL HCL 50 MG PO TABS: ORAL_TABLET | ORAL | Status: DC

## 2015-03-24 MED ORDER — MIDAZOLAM HCL 5 MG/5ML IJ SOLN
INTRAMUSCULAR | Status: DC | PRN
  Administered 2015-03-24 (×5): 2 mg via INTRAVENOUS

## 2015-03-24 NOTE — Discharge Instructions (Signed)
You have internal hemorrhoids. YOU had ONE POLYP removed. I PLACED A CLIP TO PREVENT BLEEDING IN 7-10 DAYS FROM YOUR POLYPECTOMY SITE. I PLACED 3 BANDS TO TREAT YOUR HEMORRHOIDAL PAIN/BLEEDING/SOILING. YOU MAY SOME MILD BLEEDING OVER THE NEXT 3 TO 5 DAYS.   NO MRI FOR 30 DAYS.  DRINK WATER TO KEEP URINE LIGHT YELLOW.  YOU MAY USE ULTRAM, NAPROXEN OR IBUPROFEN TWICE DAILY FOR RECTAL DISCOMFORT. TYLENOL AS NEED FOR ADDITIONAL PAIN RELIEF.  IF NEEDED USE COLACE TWICE DAILY TO SOFTEN STOOL.  YOUR BIOPSY RESULTS WILL BE AVAILABLE IN MY CHART AFTER JUN 10 AND MY OFFICE WILL CONTACT YOU IN 10-14 DAYS WITH YOUR RESULTS.   FOLLOW A HIGH FIBER DIET. AVOID ITEMS THAT CAUSE BLOATING & GAS. SEE INFO BELOW.  CALL IF YOU DECIDE TO SEE A SURGEON TO DISCUSS A HEMORRHOIDECTOMY.  FOLLOW UP IN 3 MOS.  Next colonoscopy in 3-5 years.    Colonoscopy Care After Read the instructions outlined below and refer to this sheet in the next week. These discharge instructions provide you with general information on caring for yourself after you leave the hospital. While your treatment has been planned according to the most current medical practices available, unavoidable complications occasionally occur. If you have any problems or questions after discharge, call DR. Achaia Garlock, [phone].  ACTIVITY  You may resume your regular activity, but move at a slower pace for the next 24 hours.   Take frequent rest periods for the next 24 hours.   Walking will help get rid of the air and reduce the bloated feeling in your belly (abdomen).   No driving for 24 hours (because of the medicine (anesthesia) used during the test).   You may shower.   Do not sign any important legal documents or operate any machinery for 24 hours (because of the anesthesia used during the test).    NUTRITION  Drink plenty of fluids.   You may resume your normal diet as instructed by your doctor.   Begin with a light meal and progress  to your normal diet. Heavy or fried foods are harder to digest and may make you feel sick to your stomach (nauseated).   Avoid alcoholic beverages for 24 hours or as instructed.    MEDICATIONS  You may resume your normal medications.   WHAT YOU CAN EXPECT TODAY  Some feelings of bloating in the abdomen.   Passage of more gas than usual.   Spotting of blood in your stool or on the toilet paper  .  IF YOU HAD POLYPS REMOVED DURING THE COLONOSCOPY:  Eat a soft diet IF YOU HAVE NAUSEA, BLOATING, ABDOMINAL PAIN, OR VOMITING.    FINDING OUT THE RESULTS OF YOUR TEST Not all test results are available during your visit. DR. Oneida Alar WILL CALL YOU WITHIN 14 DAYS OF YOUR PROCEDUE WITH YOUR RESULTS. Do not assume everything is normal if you have not heard from DR. Leydi Winstead, CALL HER OFFICE AT [phone].  SEEK IMMEDIATE MEDICAL ATTENTION AND CALL THE OFFICE: [phone] IF:  You have more than a spotting of blood in your stool.   Your belly is swollen (abdominal distention).   You are nauseated or vomiting.   You have a temperature over 101F.   You have abdominal pain or discomfort that is severe or gets worse throughout the day.   Hemorrhoids Hemorrhoids are dilated (enlarged) veins around the rectum. Sometimes clots will form in the veins. This makes them swollen and painful. These are called thrombosed hemorrhoids.  Causes of hemorrhoids include:  Constipation.   Straining to have a bowel movement.   HEAVY LIFTING  HOME CARE INSTRUCTIONS  Eat a well balanced diet and drink 6 to 8 glasses of water every day to avoid constipation. You may also use a bulk laxative.   Avoid straining to have bowel movements.   Keep anal area dry and clean.   Do not use a donut shaped pillow or sit on the toilet for long periods. This increases blood pooling and pain.   Move your bowels when your body has the urge; this will require less straining and will decrease pain and pressure.     High-Fiber Diet A high-fiber diet changes your normal diet to include more whole grains, legumes, fruits, and vegetables. Changes in the diet involve replacing refined carbohydrates with unrefined foods. The calorie level of the diet is essentially unchanged. The Dietary Reference Intake (recommended amount) for adult males is 38 grams per day. For adult females, it is 25 grams per day. Pregnant and lactating women should consume 28 grams of fiber per day. Fiber is the intact part of a plant that is not broken down during digestion. Functional fiber is fiber that has been isolated from the plant to provide a beneficial effect in the body.  PURPOSE  Increase stool bulk.   Ease and regulate bowel movements.   Lower cholesterol.   REDUCE RISK OF COLON CANCER  INDICATIONS THAT YOU NEED MORE FIBER  Constipation and hemorrhoids.   Uncomplicated diverticulosis (intestine condition) and irritable bowel syndrome.   Weight management.   As a protective measure against hardening of the arteries (atherosclerosis), diabetes, and cancer.   GUIDELINES FOR INCREASING FIBER IN THE DIET  Start adding fiber to the diet slowly. A gradual increase of about 5 more grams (2 slices of whole-wheat bread, 2 servings of most fruits or vegetables, or 1 bowl of high-fiber cereal) per day is best. Too rapid an increase in fiber may result in constipation, flatulence, and bloating.   Drink enough water and fluids to keep your urine clear or pale yellow. Water, juice, or caffeine-free drinks are recommended. Not drinking enough fluid may cause constipation.   Eat a variety of high-fiber foods rather than one type of fiber.   Try to increase your intake of fiber through using high-fiber foods rather than fiber pills or supplements that contain small amounts of fiber.   The goal is to change the types of food eaten. Do not supplement your present diet with high-fiber foods, but replace foods in your present  diet.   INCLUDE A VARIETY OF FIBER SOURCES  Replace refined and processed grains with whole grains, canned fruits with fresh fruits, and incorporate other fiber sources. White rice, white breads, and most bakery goods contain little or no fiber.   Brown whole-grain rice, buckwheat oats, and many fruits and vegetables are all good sources of fiber. These include: broccoli, Brussels sprouts, cabbage, cauliflower, beets, sweet potatoes, white potatoes (skin on), carrots, tomatoes, eggplant, squash, berries, fresh fruits, and dried fruits.   Cereals appear to be the richest source of fiber. Cereal fiber is found in whole grains and bran. Bran is the fiber-rich outer coat of cereal grain, which is largely removed in refining. In whole-grain cereals, the bran remains. In breakfast cereals, the largest amount of fiber is found in those with "bran" in their names. The fiber content is sometimes indicated on the label.   You may need to include additional fruits and  vegetables each day.   In baking, for 1 cup white flour, you may use the following substitutions:   1 cup whole-wheat flour minus 2 tablespoons.   1/2 cup white flour plus 1/2 cup whole-wheat flour.   Polyps, Colon  A polyp is extra tissue that grows inside your body. Colon polyps grow in the large intestine. The large intestine, also called the colon, is part of your digestive system. It is a long, hollow tube at the end of your digestive tract where your body makes and stores stool. Most polyps are not dangerous. They are benign. This means they are not cancerous. But over time, some types of polyps can turn into cancer. Polyps that are smaller than a pea are usually not harmful. But larger polyps could someday become or may already be cancerous. To be safe, doctors remove all polyps and test them.   PREVENTION There is not one sure way to prevent polyps. You might be able to lower your risk of getting them if you:  Eat more fruits and  vegetables and less fatty food.   Do not smoke.   Avoid alcohol.   Exercise every day.   Lose weight if you are overweight.   Eating more calcium and folate can also lower your risk of getting polyps. Some foods that are rich in calcium are milk, cheese, and broccoli. Some foods that are rich in folate are chickpeas, kidney beans, and spinach.

## 2015-03-24 NOTE — Op Note (Signed)
[facility] [address] [place], [address]   COLONOSCOPY PROCEDURE REPORT  PATIENT: Sarah Dean, Sarah Dean  MR#: [id number] BIRTHDATE: August 05, 1966 , 49  yrs. old GENDER: female ENDOSCOPIST: Danie Binder, MD REFERRED DD:[id number] [name], M.D. PROCEDURE DATE:  2015-04-21 PROCEDURE:   Colonoscopy with snare polypectomy and Hemorrhoidectomy via banding, clips or ligation INDICATIONS:rectal bleeding and RECTAL PAIN. MEDICATIONS: Demerol 100 mg IV and Versed 10 mg IV  DESCRIPTION OF PROCEDURE:    Physical exam was performed.  Informed consent was obtained from the patient after explaining the benefits, risks, and alternatives to procedure.  The patient was connected to monitor and placed in left lateral position. Continuous oxygen was provided by nasal cannula and IV medicine administered through an indwelling cannula.  After administration of sedation and rectal exam, the patients rectum was intubated and the EC-[id number] ([id number])  colonoscope was advanced under direct visualization to the ileum.  The scope was removed slowly by carefully examining the color, texture, anatomy, and integrity mucosa on the way out.  The patient was recovered in endoscopy and discharged home in satisfactory condition.    COLON FINDINGS: A flat polyp ranging from 7 to 36mm in size was found in the descending colon.  A polypectomy was performed using snare cautery.  The wound at the site was closed by placing hemoclips. Two (2) placements were made.  Two (2) placements were made, There was moderate diverticulosis noted in the sigmoid colon with associated muscular hypertrophy.  , Moderate sized internal hemorrhoids were found.  3 BANDS APPLIED. Large external hemorrhoids were found.  PREP QUALITY: excellent.  CECAL W/D TIME: 17       minutes COMPLICATIONS: rectal pain and discomfort after banding. 2 BANDS REMOVED AND 1 REMAINS. PT D/C RECTAL PAIN IMPROVED.  ENDOSCOPIC IMPRESSION: 1.   ONE  COLON POLYP REMOVED 2.   Moderate diverticulosis in the sigmoid colon 3.   Moderate sized internal hemorrhoids 4.   Large external hemorrhoids  RECOMMENDATIONS: NO MRI FOR 30 DAYS. DRINK WATER TO KEEP URINE LIGHT YELLOW. USE ULTRAM, NAPROXEN OR IBUPROFEN TWICE DAILY FOR RECTAL DISCOMFORT. TYLENOL AS NEED FOR ADDITIONAL PAIN RELIEF. IF NEEDED USE COLACE TWICE DAILY TO SOFTEN STOOL. AWAIT BIOPSY RESULTS. FOLLOW A HIGH FIBER DIET.  AVOID ITEMS THAT CAUSE BLOATING & GAS. PT WILL CALL IF SHE DECIDES TO SEE A SURGEON TO DISCUSS A HEMORRHOIDECTOMY. FOLLOW UP IN 3 MOS. Next colonoscopy in 3-5 years.  eSigned:  Danie Binder, MD April 21, 2015 5:49 PM CPT CODES: ICD CODES:  The ICD and CPT codes recommended by this software are interpretations from the data that the clinical staff has captured with the software.  The verification of the translation of this report to the ICD and CPT codes and modifiers is the sole responsibility of the health care institution and practicing physician where this report was generated.  [employer] will not be held responsible for the validity of the ICD and CPT codes included on this report.  AMA assumes no liability for data contained or not contained herein. CPT is a Designer, television/film set of the [employer].

## 2015-03-24 NOTE — H&P (Signed)
Primary Care Physician:  Monico Blitz, MD Primary Gastroenterologist:  Dr. Oneida Alar  Pre-Procedure History & Physical: HPI:  Sarah Dean is a 49 y.o. female here for BRBPR/RECTAL PAIN.  Past Medical History  Diagnosis Date  . Morbid obesity   . Condyloma   . Rheumatoid arthritis 2005  . Tubal ectopic pregnancy     times 2  . STD (sexually transmitted disease)     + trich    Past Surgical History  Procedure Laterality Date  . Total abdominal hysterectomy  04/25/07    BSO  . Cholecystectomy  04/25/07  . Ankle surgery      Prior to Admission medications   Medication Sig Start Date End Date Taking? Authorizing Provider  Adalimumab (HUMIRA PEN [place]) Inject into the skin every 21 ( twenty-one) days.   Yes Historical Provider, MD  Bumetanide (BUMEX PO) Take 1 tablet by mouth daily as needed (fluid). occ   Yes Historical Provider, MD  Calcium Carbonate-Vitamin D (CALCIUM + D PO) Take 1 tablet by mouth daily.    Yes Historical Provider, MD  cetirizine (ZYRTEC) 10 MG tablet Take 10 mg by mouth daily.   Yes Historical Provider, MD  Cholecalciferol (VITAMIN D PO) Take 1 tablet by mouth daily.    Yes Historical Provider, MD  CRANBERRY PO Take 1 tablet by mouth daily.    Yes Historical Provider, MD  estrogens, conjugated, (PREMARIN) 1.25 MG tablet Take 1 tablet (1.25 mg total) by mouth daily. 04/08/14  Yes Regina Eck, CNM  Flaxseed, Linseed, (FLAXSEED OIL PO) Take 1 capsule by mouth daily.    Yes Historical Provider, MD  folic acid (FOLVITE) 1 MG tablet Take 1 mg by mouth daily.   Yes Historical Provider, MD  methotrexate 1 G injection Inject into the vein every 7 (seven) days.   Yes Historical Provider, MD  milk thistle 175 MG tablet Take 175 mg by mouth daily.    Yes Historical Provider, MD  Omega-3 Fatty Acids (FISH OIL PO) Take 2 capsules by mouth daily.    Yes Historical Provider, MD  peg 3350 powder (MOVIPREP) 100 G SOLR Take 1 kit (200 g total) by mouth as directed. 02/18/15   Yes Danie Binder, MD  Probiotic Product (PROBIOTIC PO) Take 1 capsule by mouth daily.    Yes Historical Provider, MD    Allergies as of 03/04/2015  . (No Known Allergies)    Family History  Problem Relation Age of Onset  . Cancer Father   . Cirrhosis Father   . Colon cancer Neg Hx     History   Social History  . Marital Status: Married    Spouse Name: N/A  . Number of Children: N/A  . Years of Education: N/A   Occupational History  . Not on file.   Social History Main Topics  . Smoking status: Former Research scientist (life sciences)  . Smokeless tobacco: Not on file  . Alcohol Use: No  . Drug Use: No  . Sexual Activity:    Partners: Male    Birth Control/ Protection: Surgical     Comment: TAH   Other Topics Concern  . Not on file   Social History Narrative    Review of Systems: See HPI, otherwise negative ROS   Physical Exam: BP 154/97 mmHg  Pulse 81  Temp(Src) 97.5 F (36.4 C) (Oral)  Resp 20  Ht $R'5\' 6"'mi$  (1.676 m)  Wt 351 lb (159.213 kg)  BMI 56.68 kg/m2  SpO2 96%  LMP 04/25/2007 General:  Alert,  pleasant and cooperative in NAD Head:  Normocephalic and atraumatic. Neck:  Supple; Lungs:  Clear throughout to auscultation.    Heart:  Regular rate and rhythm. Abdomen:  Soft, nontender and nondistended. Normal bowel sounds, without guarding, and without rebound.   Neurologic:  Alert and  oriented x4;  grossly normal neurologically.  Impression/Plan:    BRBPR/RECTAL PAIN  PLAN: [facility]/?HEMORRHOID BANDING TODAY

## 2015-03-26 ENCOUNTER — Encounter (HOSPITAL_COMMUNITY): Payer: Self-pay | Admitting: Gastroenterology

## 2015-04-05 ENCOUNTER — Telehealth: Payer: Self-pay | Admitting: Gastroenterology

## 2015-04-05 NOTE — Telephone Encounter (Signed)
OV MADE AND REMINDER IN EPIC

## 2015-04-05 NOTE — Telephone Encounter (Signed)
LMOM to call.

## 2015-04-05 NOTE — Telephone Encounter (Signed)
Please call pt. She had a simple adenoma removed.   NO MRI UNTIL JUL 8.  DRINK WATER TO KEEP URINE LIGHT YELLOW.  FOLLOW A HIGH FIBER DIET. AVOID ITEMS THAT CAUSE BLOATING & GAS.   CALL IF YOU DECIDE TO SEE A SURGEON TO DISCUSS A HEMORRHOIDECTOMY.  FOLLOW UP IN 3 MOS E30 RECTAL BLEEDING/PAIN/LARGE EXTERNAL HEMORRHOIDS  Next colonoscopy in 5-10 years.

## 2015-04-06 NOTE — Telephone Encounter (Signed)
Pt is aware of results. 

## 2015-04-12 ENCOUNTER — Other Ambulatory Visit: Payer: Self-pay | Admitting: Certified Nurse Midwife

## 2015-04-12 NOTE — Telephone Encounter (Addendum)
Medication refill request: Premarin 1.25 mg Last AEX:  04/09/15 with DL Next AEX: 04/14/15 with DL Last MMG (if hormonal medication request): 01/14/14 bi-rads 1: Negative  Refill authorized: #[id number] rfs, please advise  (routed to PG since DL is out of office)

## 2015-04-14 ENCOUNTER — Ambulatory Visit (INDEPENDENT_AMBULATORY_CARE_PROVIDER_SITE_OTHER): Payer: 59 | Admitting: Certified Nurse Midwife

## 2015-04-14 ENCOUNTER — Encounter: Payer: Self-pay | Admitting: Certified Nurse Midwife

## 2015-04-14 VITALS — BP 112/70 | HR 88 | Resp 18 | Ht 65.0 in | Wt 368.0 lb

## 2015-04-14 DIAGNOSIS — N951 Menopausal and female climacteric states: Secondary | ICD-10-CM | POA: Diagnosis not present

## 2015-04-14 DIAGNOSIS — Z0000 Encounter for general adult medical examination without abnormal findings: Secondary | ICD-10-CM

## 2015-04-14 DIAGNOSIS — N390 Urinary tract infection, site not specified: Secondary | ICD-10-CM

## 2015-04-14 DIAGNOSIS — Z01419 Encounter for gynecological examination (general) (routine) without abnormal findings: Secondary | ICD-10-CM

## 2015-04-14 LAB — POCT URINALYSIS DIPSTICK
GLUCOSE UA: NEGATIVE
Ketones, UA: NEGATIVE
Nitrite, UA: NEGATIVE
Urobilinogen, UA: 1
pH, UA: 5

## 2015-04-14 MED ORDER — PHENAZOPYRIDINE HCL 200 MG PO TABS: 200.0000 mg | ORAL_TABLET | Freq: Three times a day (TID) | ORAL | Status: DC | PRN

## 2015-04-14 MED ORDER — ESTROGENS CONJUGATED 1.25 MG PO TABS: 1.2500 mg | ORAL_TABLET | Freq: Every day | ORAL | Status: DC

## 2015-04-14 NOTE — Progress Notes (Signed)
49 y.o. G34P0020 Married  Caucasian Fe here for annual exam. Denies vaginal bleeding or vaginal dryness. Continues to urinary frequency and discomfort, was treated for UTI with 7 days of antibiotic and given ? Sulfa medication. Rough year wth sciatic nerve on left and had limited mobility with weight gain. Also had rectal bleeding with colonoscopy and hemorrhoids banding and fissure. Premarin working well ,no hot flashes.  RA also flaring, sees Rheumatology for management and labs. No other health concerns today.  Patient's last menstrual period was 04/25/2007.          Sexually active: Yes.    The current method of family planning is status post hysterectomy.    Exercising: No.  The patient does not participate in regular exercise at present. Smoker:  no  Health Maintenance: Pap: 03/22/12 Neg MMG:  01/14/14 BIRADS1:Neg was done 4/16 neg. Self Breast Exam: yes, sometimes  Colonoscopy: 03/24/15 Polyps - repeat 3-5 years  BMD:   none TDaP:  2010 Labs: PCP UA: Bilirubin +, RBC=Mod, WBC=Mod, Protein=Trace.   reports that she has quit smoking. She has never used smokeless tobacco. She reports that she drinks about 1.2 oz of alcohol per week. She reports that she does not use illicit drugs.  Past Medical History  Diagnosis Date  . Morbid obesity   . Condyloma   . Rheumatoid arthritis 2005  . Tubal ectopic pregnancy     times 2  . STD (sexually transmitted disease)     + trich    Past Surgical History  Procedure Laterality Date  . Total abdominal hysterectomy  04/25/07    BSO  . Cholecystectomy  04/25/07  . Ankle surgery    . Colonoscopy N/A 03/24/2015    Procedure: COLONOSCOPY;  Surgeon: Danie Binder, MD;  [facility];  Service: Endoscopy;  Laterality: N/A;  1030 - moved to 9:45 - Ginger notified pt  . Hemorrhoid banding N/A 03/24/2015    Procedure: HEMORRHOID BANDING;  Surgeon: Danie Binder, MD;  Location: [facility];  Service: Endoscopy;  Laterality: N/A;    Current  Outpatient Prescriptions  Medication Sig Dispense Refill  . Adalimumab (HUMIRA PEN [place]) Inject into the skin every 21 ( twenty-one) days.    . Bumetanide (BUMEX PO) Take 1 tablet by mouth daily as needed (fluid). occ    . Calcium Carbonate-Vitamin D (CALCIUM + D PO) Take 1 tablet by mouth daily.     . cetirizine (ZYRTEC) 10 MG tablet Take 10 mg by mouth daily.    . Cholecalciferol (VITAMIN D PO) Take 1 tablet by mouth daily.     Marland Kitchen CRANBERRY PO Take 1 tablet by mouth daily.     . Flaxseed, Linseed, (FLAXSEED OIL PO) Take 1 capsule by mouth daily.     . folic acid (FOLVITE) 1 MG tablet Take 1 mg by mouth daily.    . methotrexate 1 G injection Inject into the vein every 7 (seven) days.    . milk thistle 175 MG tablet Take 175 mg by mouth daily.     . Omega-3 Fatty Acids (FISH OIL PO) Take 2 capsules by mouth daily.     Marland Kitchen PREMARIN 1.25 MG tablet TAKE 1 TABLET BY MOUTH DAILY. 30 tablet 0  . Probiotic Product (PROBIOTIC PO) Take 1 capsule by mouth daily.     . traMADol (ULTRAM) 50 MG tablet 1 po q6h prn pain 20 tablet 0   No current facility-administered medications for this visit.    Family  History  Problem Relation Age of Onset  . Cancer Father   . Cirrhosis Father   . Colon cancer Neg Hx     ROS:  Pertinent items are noted in HPI.  Otherwise, a comprehensive ROS was negative.  Exam:   BP 112/70 mmHg  Pulse 88  Resp 18  Ht 5\' 5"  (1.651 m)  Wt 368 lb (166.924 kg)  BMI 61.24 kg/m2  LMP 04/25/2007 Height: 5\' 5"  (165.1 cm) Ht Readings from Last 3 Encounters:  04/14/15 5\' 5"  (1.651 m)  03/24/15 5\' 6"  (1.676 m)  02/16/15 5\' 6"  (1.676 m)    General appearance: alert, cooperative and appears stated age Head: Normocephalic, without obvious abnormality, atraumatic Neck: no adenopathy, supple, symmetrical, trachea midline and thyroid normal to inspection and palpation Lungs: clear to auscultation bilaterally Breasts: normal appearance, no masses or tenderness, No nipple retraction or  dimpling, No nipple discharge or bleeding, No axillary or supraclavicular adenopathy Heart: regular rate and rhythm Abdomen: soft, non-tender; no masses,  no organomegaly Extremities: extremities normal, atraumatic, no cyanosis or edema Skin: Skin color, texture, turgor normal. No rashes or lesions Lymph nodes: Cervical, supraclavicular, and axillary nodes normal. No abnormal inguinal nodes palpated Neurologic: Grossly normal   Pelvic: External genitalia:  no lesions              Urethra:  normal appearing urethra with no masses, tenderness or lesions              Bartholin's and Skene's: normal                 Vagina: normal appearing vagina with normal color and discharge, no lesions              Cervix: absent              Pap taken: No. Bimanual Exam:  Uterus:  uterus absent              Adnexa: no mass, fullness, tenderness and adnexa not present               Rectovaginal: Confirms               Anus:  Deferred due to recent colonoscopy  Chaperone present: Yes  A:  Well Woman with normal exam  S/P TAH BSO due to bleeding on [name] working well  UTI recently treated still symptomatic  RA with MD management  Sciatic nerve flare with left leg  Recent colonoscopy with polyp removed repeat in 5 years  P:   Reviewed health and wellness pertinent to exam  Discussed risks/benefits of [name],desires continuance.  RX Premarin see order  Discussed finding and will need to send out Lab to check for bacteria in urine, patient agreeable.Warning signs of UTI given. Discussed increase water intake and avoiding soda, coffee or tea.  Lab: Urine micro/culture  Rx Pyridium see order. Will treat with additional Rx if indicated by micro.  Continue with follow up with MD as indicated  Pap smear not taken today   counseled on breast self exam, mammography screening, adequate intake of calcium and vitamin D. Discussed considering bariatric surgery for weight loss to help decrease other health issues.  Patient will consider and advise if needs referral or will discuss with PCP.  return annually or prn  An After Visit Summary was printed and given to the patient.

## 2015-04-14 NOTE — Patient Instructions (Signed)

## 2015-04-15 LAB — URINALYSIS, MICROSCOPIC ONLY
Bacteria, UA: NONE SEEN
CASTS: NONE SEEN

## 2015-04-15 LAB — URINE CULTURE
Colony Count: NO GROWTH
Organism ID, Bacteria: NO GROWTH

## 2015-04-15 NOTE — Progress Notes (Signed)
Reviewed personally.  M. Suzanne Tinamarie Przybylski, MD.  

## 2015-04-16 NOTE — Telephone Encounter (Signed)
REFER PT TO [facility] FOR RECTAL PAIN. CONSIDER HEMORRHOIDECTOMY.

## 2015-04-16 NOTE — Telephone Encounter (Signed)
Pt would like to go to [facility] for the hemorrhoidectomy.

## 2015-04-16 NOTE — Telephone Encounter (Signed)
Referral had been made and her appointment is on 04/29/15 @ 9:15.

## 2015-04-20 NOTE — Telephone Encounter (Signed)
Pt is aware.  

## 2015-04-20 NOTE — Telephone Encounter (Signed)
Tried to call her with appointment but no answer

## 2015-04-28 ENCOUNTER — Telehealth: Payer: Self-pay | Admitting: Gastroenterology

## 2015-04-28 ENCOUNTER — Other Ambulatory Visit: Payer: Self-pay

## 2015-04-28 DIAGNOSIS — R197 Diarrhea, unspecified: Secondary | ICD-10-CM

## 2015-04-28 NOTE — Telephone Encounter (Signed)
Pt is aware and will pick up the stool containers and orders tomorrow morning.

## 2015-04-28 NOTE — Telephone Encounter (Signed)
I called pt and she said that she was told by PCP Monday that they thought she had a virus and to try clear liquids for 24 hours and then puddings etc. She is still having diarrhea about 12 times a day. And then she feels like she has to do something and no more will come out.  She does not have fever or chills. She said she has lost 18 lbs in 5 days being sick.  Laban Emperor, NP saw her last in May 2016. Routing to her to advise!

## 2015-04-28 NOTE — Telephone Encounter (Signed)
Stool studies: Cdiff PCR, stool culture, Giardia.

## 2015-04-28 NOTE — Telephone Encounter (Signed)
Patient called today saying that she had seen her PCP on Monday and was told that she has a virus (diarrhea). Patient is complaining that she is not getting any better and can not get anyone at her PCP office to call her back and wanted to know if she should be seen by [place] instead. I told her that I could make her an OV but it would be several weeks out. Patient said she would rather speak to the nurse and see what she would recommend. I suggested again that she should follow up with her PCP since she just seen them Monday and see what they recommend. Patient would rather speak to someone here. Please call [phone]

## 2015-04-29 ENCOUNTER — Other Ambulatory Visit: Payer: Self-pay | Admitting: Gastroenterology

## 2015-04-30 LAB — CLOSTRIDIUM DIFFICILE BY PCR: Toxigenic C. Difficile by PCR: NOT DETECTED

## 2015-04-30 LAB — GIARDIA ANTIGEN: GIARDIA SCREEN (EIA): NEGATIVE

## 2015-05-03 LAB — STOOL CULTURE

## 2015-05-04 NOTE — Progress Notes (Signed)
Quick Note:  Cdiff PCR, stool culture, Giardia all negative. Hopefully, this was a short-lived illness. How is she doing ______

## 2015-05-04 NOTE — Progress Notes (Signed)
Quick Note:  PT is aware of results. ______

## 2015-05-04 NOTE — Progress Notes (Signed)
Quick Note:  Sarah Dean she is feeling much better also. ______

## 2015-06-24 ENCOUNTER — Ambulatory Visit: Payer: 59 | Admitting: Nurse Practitioner

## 2015-10-29 MED FILL — ETODOLAC 400 MG TABLET: 400 | 30 days supply | Qty: 60 | Fill #2

## 2015-11-03 MED FILL — HUMIRA PEN 40 MG/0.8ML PNKT: 40 | 28 days supply | Qty: 2 | Fill #1

## 2015-11-05 MED FILL — PREMARIN 1.25 MG TABLET: 1.25 | 30 days supply | Qty: 30 | Fill #6

## 2015-11-22 MED FILL — HUMIRA PEN 40 MG/0.8ML PNKT: 40 | 28 days supply | Qty: 2 | Fill #0

## 2015-11-22 MED FILL — FOLIC ACID 1 MG TABLET: 1 | 90 days supply | Qty: 180 | Fill #0

## 2015-11-22 MED FILL — ETODOLAC 400 MG TABLET: 400 | 30 days supply | Qty: 60 | Fill #0

## 2015-12-10 MED FILL — PREMARIN 1.25 MG TABLET: 1.25 | 30 days supply | Qty: 30 | Fill #7

## 2015-12-20 MED FILL — HUMIRA PEN 40 MG/0.8ML PNKT: 40 | 28 days supply | Qty: 2 | Fill #1

## 2015-12-20 MED FILL — ETODOLAC 400 MG TABLET: 400 | 30 days supply | Qty: 60 | Fill #1

## 2015-12-29 DIAGNOSIS — M0589 Other rheumatoid arthritis with rheumatoid factor of multiple sites: Secondary | ICD-10-CM | POA: Diagnosis not present

## 2015-12-29 DIAGNOSIS — Z79899 Other long term (current) drug therapy: Secondary | ICD-10-CM | POA: Diagnosis not present

## 2015-12-29 DIAGNOSIS — M150 Primary generalized (osteo)arthritis: Secondary | ICD-10-CM | POA: Diagnosis not present

## 2015-12-29 DIAGNOSIS — L03116 Cellulitis of left lower limb: Secondary | ICD-10-CM | POA: Diagnosis not present

## 2015-12-29 MED FILL — DOXYCYCLINE HYC 100 MG CAP: 100 | 10 days supply | Qty: 20 | Fill #0

## 2016-01-04 DIAGNOSIS — Z713 Dietary counseling and surveillance: Secondary | ICD-10-CM | POA: Diagnosis not present

## 2016-01-04 DIAGNOSIS — Z789 Other specified health status: Secondary | ICD-10-CM | POA: Diagnosis not present

## 2016-01-04 DIAGNOSIS — L0390 Cellulitis, unspecified: Secondary | ICD-10-CM | POA: Diagnosis not present

## 2016-01-04 DIAGNOSIS — Z6841 Body Mass Index (BMI) 40.0 and over, adult: Secondary | ICD-10-CM | POA: Diagnosis not present

## 2016-01-10 MED FILL — PREMARIN 1.25 MG TABLET: 1.25 | 30 days supply | Qty: 30 | Fill #8

## 2016-01-10 MED FILL — ETODOLAC 400 MG TABLET: 400 | 30 days supply | Qty: 60 | Fill #0

## 2016-01-19 DIAGNOSIS — J209 Acute bronchitis, unspecified: Secondary | ICD-10-CM | POA: Diagnosis not present

## 2016-01-19 DIAGNOSIS — L0390 Cellulitis, unspecified: Secondary | ICD-10-CM | POA: Diagnosis not present

## 2016-01-19 DIAGNOSIS — M069 Rheumatoid arthritis, unspecified: Secondary | ICD-10-CM | POA: Diagnosis not present

## 2016-01-26 MED FILL — METHOTREXATE 25 MG/ML VIAL: 50 | 84 days supply | Qty: 10 | Fill #0

## 2016-02-03 MED FILL — PREMARIN 1.25 MG TABLET: 1.25 | 30 days supply | Qty: 30 | Fill #9

## 2016-02-03 MED FILL — HUMIRA PEN 40 MG/0.8ML PNKT: 40 | 28 days supply | Qty: 2 | Fill #0

## 2016-02-17 MED FILL — ETODOLAC 400 MG TABLET: 400 | 30 days supply | Qty: 60 | Fill #1

## 2016-02-17 MED FILL — FOLIC ACID 1 MG TABLET: 1 | 90 days supply | Qty: 180 | Fill #1

## 2016-03-09 MED FILL — PREMARIN 1.25 MG TABLET: 1.25 | 30 days supply | Qty: 30 | Fill #10

## 2016-03-16 MED FILL — ETODOLAC 400 MG TABLET: 400 | 30 days supply | Qty: 60 | Fill #0

## 2016-03-16 MED FILL — HUMIRA PEN 40 MG/0.8ML PNKT: 40 | 28 days supply | Qty: 2 | Fill #1

## 2016-03-29 DIAGNOSIS — Z79899 Other long term (current) drug therapy: Secondary | ICD-10-CM | POA: Diagnosis not present

## 2016-03-29 DIAGNOSIS — M150 Primary generalized (osteo)arthritis: Secondary | ICD-10-CM | POA: Diagnosis not present

## 2016-03-29 DIAGNOSIS — L03116 Cellulitis of left lower limb: Secondary | ICD-10-CM | POA: Diagnosis not present

## 2016-03-29 DIAGNOSIS — M25562 Pain in left knee: Secondary | ICD-10-CM | POA: Diagnosis not present

## 2016-03-29 DIAGNOSIS — M0589 Other rheumatoid arthritis with rheumatoid factor of multiple sites: Secondary | ICD-10-CM | POA: Diagnosis not present

## 2016-03-29 DIAGNOSIS — M25561 Pain in right knee: Secondary | ICD-10-CM | POA: Diagnosis not present

## 2016-04-07 MED FILL — PREMARIN 1.25 MG TABLET: 1.25 | 30 days supply | Qty: 30 | Fill #11

## 2016-04-14 ENCOUNTER — Ambulatory Visit: Payer: 59 | Admitting: Certified Nurse Midwife

## 2016-04-26 ENCOUNTER — Ambulatory Visit (INDEPENDENT_AMBULATORY_CARE_PROVIDER_SITE_OTHER): Payer: 59 | Admitting: Certified Nurse Midwife

## 2016-04-26 ENCOUNTER — Ambulatory Visit (HOSPITAL_BASED_OUTPATIENT_CLINIC_OR_DEPARTMENT_OTHER): Payer: 59 | Admitting: Pharmacist

## 2016-04-26 ENCOUNTER — Encounter: Payer: Self-pay | Admitting: Certified Nurse Midwife

## 2016-04-26 VITALS — BP 110/70 | HR 72 | Resp 16 | Ht 65.0 in | Wt 398.0 lb

## 2016-04-26 DIAGNOSIS — N898 Other specified noninflammatory disorders of vagina: Secondary | ICD-10-CM

## 2016-04-26 DIAGNOSIS — Z01419 Encounter for gynecological examination (general) (routine) without abnormal findings: Secondary | ICD-10-CM

## 2016-04-26 DIAGNOSIS — N9489 Other specified conditions associated with female genital organs and menstrual cycle: Secondary | ICD-10-CM | POA: Diagnosis not present

## 2016-04-26 DIAGNOSIS — N951 Menopausal and female climacteric states: Secondary | ICD-10-CM

## 2016-04-26 DIAGNOSIS — M069 Rheumatoid arthritis, unspecified: Secondary | ICD-10-CM

## 2016-04-26 MED ORDER — ESTROGENS CONJUGATED 1.25 MG PO TABS: 1.2500 mg | ORAL_TABLET | Freq: Every day | ORAL | Status: DC

## 2016-04-26 NOTE — Patient Instructions (Signed)

## 2016-04-26 NOTE — Progress Notes (Signed)
   S: Patient presents today to the [facility].  Patient is currently taking Humira for rheumatoid arthritis (RA). Patient is managed by Dr. Amil Amen for this.   Adherence: denies any missed doses. Rotates sites for both the Humira and methotrexate injections.   Dosing:  Rheumatoid arthritis: SubQ: 40 mg every other week (may continue methotrexate, other nonbiologic DMARDS, corticosteroids, NSAIDs, and/or analgesics); patients not taking concomitant methotrexate may increase dose to 40 mg every week  Drug-drug interactions: etodolac can increase levels of methotrexate but patient is on anti-rheumatologic dose of methotrexate.  Screening: TB test: completed per patient Hepatitis: completed per patient  Monitoring: S/sx of infection: denies CBC: done q3 months S/sx of hypersensitivity: denies S/sx of malignancy: denies S/sx of heart failure: denies    O:     Lab Results  Component Value Date   HGB 12.9 10/01/2007   HCT 38.0 10/01/2007      Chemistry      Component Value Date/Time   NA 140 10/01/2007 1252   K 4.1 10/01/2007 1252   CL 106 10/01/2007 1252   BUN 10 10/01/2007 1252   No results found for: CALCIUM, ALKPHOS, AST, ALT, BILITOT   Last labs available to be from [facility] (03/2015): WNL  A/P: 1. Medication review: Patient on Humira for RA and tolerating it well with no adverse effects and improved RA control. Reviewed the medication with her, including the following: Humira is a TNF blocking agent indicated for ankylosing spondylitis, Crohn's disease, Hidradenitis suppurativa, psoriatic arthritis, plaque psoriasis, ulcerative colitis, and uveitis. The most common adverse effects are infections, headache, and injection site reactions. There is the possibility of an increased risk of malignancy but it is not well understood if this increased risk is due to there medication or the disease state. There are rare cases of  pancytopenia and aplastic anemia. No recommendations for any changes. Will request most recent labs from Dr. Melissa Noon office.   Nicoletta Ba, PharmD, BCPS, [facility] [phone]

## 2016-04-26 NOTE — Progress Notes (Signed)
50 y.o. G7P0020 Married  Caucasian Fe here for annual exam. [name] working well for occasional hot flashes Denies vaginal bleeding or vaginal dryness.Sees PCP [name] for yearly exam. Sees [name] for methtrexate management for RA. Very disappointed in herself due to weight gain again. Has gained back almost all she had lost 3-4 years ago. Marriage has increased food consumption with eating out and traveling. Currently having difficulty with walking due knee pain. Has been planning for a while to seek Bariatric clinic help. No other health concerns today.   Patient's last menstrual period was 04/25/2007.          Sexually active: Yes.    The current method of family planning is status post hysterectomy.    Exercising: Yes.    walking Smoker:  no  Health Maintenance: Pap:  03-22-12 neg MMG:  02-10-15 category a density,birads 1:neg Colonoscopy:  2016 polyps f/u 3-34yrs BMD:   none TDaP:  2010 Shingles: no Pneumonia: no Hep C and HIV: HIV neg yrs ago Labs: none  Self breast exam: husband checks   reports that she has quit smoking. She has never used smokeless tobacco. She reports that she drinks about 0.6 oz of alcohol per week. She reports that she does not use illicit drugs.  Past Medical History  Diagnosis Date  . Morbid obesity ([place])   . Condyloma   . Rheumatoid arthritis ([name]) 2005  . Tubal ectopic pregnancy     times 2  . STD (sexually transmitted disease)     + trich    Past Surgical History  Procedure Laterality Date  . Total abdominal hysterectomy  04/25/07    BSO  . Cholecystectomy  04/25/07  . Ankle surgery    . Colonoscopy N/A 03/24/2015    [id number] external hemorrhoids/moderate sized internal hemorrhoids/moderate diverticulosis in the sigmoid colon  . Hemorrhoid banding N/A 03/24/2015    Procedure: HEMORRHOID BANDING;  Surgeon: Danie Binder, MD;  [facility];  Service: Endoscopy;  Laterality: N/A;    Current Outpatient Prescriptions  Medication Sig  Dispense Refill  . Bumetanide (BUMEX PO) Take 1 tablet by mouth daily as needed (fluid). occ    . cetirizine (ZYRTEC) 10 MG tablet Take 10 mg by mouth daily.    . Cholecalciferol (VITAMIN D PO) Take 1 tablet by mouth daily.     Marland Kitchen CRANBERRY PO Take 1 tablet by mouth daily.     Marland Kitchen estrogens, conjugated, (PREMARIN) 1.25 MG tablet Take 1 tablet (1.25 mg total) by mouth daily. 30 tablet 12  . etodolac (LODINE) 400 MG tablet Take 400 mg by mouth 2 (two) times daily.    . Flaxseed, Linseed, (FLAXSEED OIL PO) Take 1 capsule by mouth daily.     . folic acid (FOLVITE) 1 MG tablet Take 1 mg by mouth daily.    Marland Kitchen HUMIRA PEN 40 MG/0.8ML PNKT every 14 (fourteen) days.   1  . KRILL OIL PO Take by mouth daily.    . methotrexate 1 G injection Inject into the vein every 7 (seven) days.    . methotrexate 50 MG/2ML injection   0  . milk thistle 175 MG tablet Take 175 mg by mouth daily.     . Probiotic Product (PROBIOTIC PO) Take 1 capsule by mouth daily.     Marland Kitchen UNABLE TO FIND daily. Herbal meds     No current facility-administered medications for this visit.    Family History  Problem Relation Age of Onset  . Cancer Father   .  Cirrhosis Father   . Colon cancer Neg Hx     ROS:  Pertinent items are noted in HPI.  Otherwise, a comprehensive ROS was negative.  Exam:   BP 110/70 mmHg  Pulse 72  Resp 16  Ht 5\' 5"  (1.651 m)  Wt 398 lb (180.532 kg)  BMI 66.23 kg/m2  LMP 04/25/2007 Height: 5\' 5"  (165.1 cm) Ht Readings from Last 3 Encounters:  04/26/16 5\' 5"  (1.651 m)  04/14/15 5\' 5"  (1.651 m)  03/24/15 5\' 6"  (1.676 m)    General appearance: alert, cooperative and appears stated age Head: Normocephalic, without obvious abnormality, atraumatic Neck: no adenopathy, supple, symmetrical, trachea midline and thyroid normal to inspection and palpation Lungs: clear to auscultation bilaterally Breasts: normal appearance, no masses or tenderness, No nipple retraction or dimpling, No nipple discharge or  bleeding, No axillary or supraclavicular adenopathy Heart: regular rate and rhythm Abdomen: soft, non-tender; no masses,  no organomegaly Extremities: extremities normal, atraumatic, no cyanosis or edema Skin: Skin color, texture, turgor normal. No rashes or lesions Lymph nodes: Cervical, supraclavicular, and axillary nodes normal. No abnormal inguinal nodes palpated Neurologic: Grossly normal   Pelvic: External genitalia:  no lesions              Urethra:  normal appearing urethra with no masses, tenderness or lesions              Bartholin's and Skene's: normal                 Vagina: normal appearing vagina with normal color and discharge, no lesions              Cervix: absent              Pap taken: No. Bimanual Exam:  Uterus:  uterus absent              Adnexa: no mass, fullness, tenderness and limited by body habitus                Rectovaginal: Confirms               Anus:  normal sphincter tone, small hemorrhoid noted, not thrombosed  Chaperone present: yes  A:  Well Woman with normal exam  S/P TAH with  BSO, for bleeding [name] working well  RA with Rheumatology management  Morbid obesity with additional 30 pound weight gain in one year, plans Bariatric dietary visit  P:   Reviewed health and wellness pertinent to exam  Risks and benefits of [name] discussed, desires again.  Rx Premarin see order with instructions  Continue follow up with MD as indicated.  Discussed weight and health morbidities with increase weight. Discussed just reducing portion size as a start, working with swimming to help with joint pain. Offered referral if desired for weight loss, patient declined. Will advise if she needs our help.  Pap smear as above not taken  counseled on breast self exam, mammography screening, adequate intake of calcium and vitamin D.  Rv aex, prn

## 2016-04-27 ENCOUNTER — Other Ambulatory Visit: Payer: Self-pay | Admitting: Certified Nurse Midwife

## 2016-04-27 DIAGNOSIS — B373 Candidiasis of vulva and vagina: Secondary | ICD-10-CM

## 2016-04-27 DIAGNOSIS — B3731 Acute candidiasis of vulva and vagina: Secondary | ICD-10-CM

## 2016-04-27 LAB — WET PREP BY MOLECULAR PROBE
Candida species: POSITIVE — AB
GARDNERELLA VAGINALIS: NEGATIVE
TRICHOMONAS VAG: NEGATIVE

## 2016-04-27 MED ORDER — TERCONAZOLE 0.4 % VA CREA: 1.0000 | TOPICAL_CREAM | Freq: Every day | VAGINAL | Status: DC

## 2016-04-27 MED FILL — PREMARIN 1.25 MG TABLET: 1.25 | 30 days supply | Qty: 30 | Fill #0

## 2016-04-27 MED FILL — ETODOLAC 400 MG TABLET: 400 | 30 days supply | Qty: 60 | Fill #1

## 2016-04-27 MED FILL — TERCONAZOLE 0.4% VAG CREAM: 0.4 | 7 days supply | Qty: 45 | Fill #0

## 2016-04-27 MED FILL — METHOTREXATE 25 MG/ML VIAL: 50 | 84 days supply | Qty: 10 | Fill #0

## 2016-04-28 ENCOUNTER — Other Ambulatory Visit: Payer: Self-pay | Admitting: Pharmacist

## 2016-04-28 MED ORDER — HUMIRA PEN 40 MG/0.8ML ~~LOC~~ PNKT
40.0000 mg | PEN_INJECTOR | SUBCUTANEOUS | Status: DC
Start: 2016-04-28 — End: 2016-07-25

## 2016-04-28 MED FILL — HUMIRA PEN 40 MG/0.8ML PNKT: 40 | 28 days supply | Qty: 2 | Fill #0

## 2016-05-01 NOTE — Progress Notes (Signed)
Encounter reviewed Dariana Garbett, MD   

## 2016-05-12 ENCOUNTER — Telehealth: Payer: 59 | Admitting: Family

## 2016-05-12 DIAGNOSIS — B3749 Other urogenital candidiasis: Secondary | ICD-10-CM | POA: Diagnosis not present

## 2016-05-12 DIAGNOSIS — N390 Urinary tract infection, site not specified: Secondary | ICD-10-CM

## 2016-05-12 MED ORDER — NITROFURANTOIN MONOHYD MACRO 100 MG PO CAPS: 100.0000 mg | ORAL_CAPSULE | Freq: Two times a day (BID) | ORAL | 0 refills | Status: DC

## 2016-05-12 MED ORDER — SULFAMETHOXAZOLE-TRIMETHOPRIM 800-160 MG PO TABS: 1.0000 | ORAL_TABLET | Freq: Two times a day (BID) | ORAL | 0 refills | Status: DC

## 2016-05-12 MED FILL — NITROFURANTOIN MONO-MCR 100: 100 | 7 days supply | Qty: 14 | Fill #0

## 2016-05-12 NOTE — Progress Notes (Signed)
We are sorry that you are not feeling well.  Here is how we plan to help!  Based on what you shared with me it looks like you most likely have a simple urinary tract infection.  A UTI (Urinary Tract Infection) is a bacterial infection of the bladder.  Most cases of urinary tract infections are simple to treat but a key part of your care is to encourage you to drink plenty of fluids and watch your symptoms carefully.  I have prescribed MacroBid 100 mg twice a day for 5 days  .  Your symptoms should gradually improve. Call us if the burning in your urine worsens, you develop worsening fever, back pain or pelvic pain or if your symptoms do not resolve after completing the antibiotic.  Urinary tract infections can be prevented by drinking plenty of water to keep your body hydrated.  Also be sure when you wipe, wipe from front to back and don't hold it in!  If possible, empty your bladder every 4 hours.  Your e-visit answers were reviewed by a board certified advanced clinical practitioner to complete your personal care plan.  Depending on the condition, your plan could have included both over the counter or prescription medications.  If there is a problem please reply  once you have received a response from your provider.  Your safety is important to [place].  If you have drug allergies check your prescription carefully.    You can use MyChart to ask questions about today's visit, request a non-urgent call back, or ask for a work or school excuse for 24 hours related to this e-Visit. If it has been greater than 24 hours you will need to follow up with your provider, or enter a new e-Visit to address those concerns.   You will get an e-mail in the next two days asking about your experience.  I hope that your e-visit has been valuable and will speed your recovery. Thank you for using e-visits.   i

## 2016-05-23 ENCOUNTER — Telehealth: Payer: 59 | Admitting: Nurse Practitioner

## 2016-05-23 DIAGNOSIS — N3000 Acute cystitis without hematuria: Secondary | ICD-10-CM

## 2016-05-23 MED ORDER — SULFAMETHOXAZOLE-TRIMETHOPRIM 800-160 MG PO TABS
1.0000 | ORAL_TABLET | Freq: Two times a day (BID) | ORAL | 0 refills | Status: DC
Start: 2016-05-23 — End: 2017-04-27

## 2016-05-23 MED FILL — SULFAMETHOXAZOLE/TMP DS TAB: 800-160 | 7 days supply | Qty: 14 | Fill #0

## 2016-05-23 NOTE — Progress Notes (Signed)

## 2016-05-24 MED FILL — FOLIC ACID 1 MG TABLET: 1 | 90 days supply | Qty: 180 | Fill #0

## 2016-05-24 MED FILL — HUMIRA PEN 40 MG/0.8ML PNKT: 40 | 28 days supply | Qty: 2 | Fill #1

## 2016-06-02 MED FILL — PREMARIN 1.25 MG TABLET: 1.25 | 30 days supply | Qty: 30 | Fill #1

## 2016-06-12 MED FILL — ETODOLAC 400 MG TABLET: 400 | 30 days supply | Qty: 60 | Fill #2

## 2016-06-29 DIAGNOSIS — M150 Primary generalized (osteo)arthritis: Secondary | ICD-10-CM | POA: Diagnosis not present

## 2016-06-29 DIAGNOSIS — M0589 Other rheumatoid arthritis with rheumatoid factor of multiple sites: Secondary | ICD-10-CM | POA: Diagnosis not present

## 2016-06-29 DIAGNOSIS — Z79899 Other long term (current) drug therapy: Secondary | ICD-10-CM | POA: Diagnosis not present

## 2016-06-29 DIAGNOSIS — M7989 Other specified soft tissue disorders: Secondary | ICD-10-CM | POA: Diagnosis not present

## 2016-07-05 MED FILL — HUMIRA PEN 40 MG/0.8ML PNKT: 40 | 28 days supply | Qty: 2 | Fill #2

## 2016-07-11 MED FILL — PREMARIN 1.25 MG TABLET: 1.25 | 30 days supply | Qty: 30 | Fill #2

## 2016-07-25 ENCOUNTER — Other Ambulatory Visit: Payer: Self-pay | Admitting: Pharmacist

## 2016-07-25 MED ORDER — HUMIRA PEN 40 MG/0.8ML ~~LOC~~ PNKT: 0.8000 mL | PEN_INJECTOR | SUBCUTANEOUS | 3 refills | Status: DC

## 2016-07-26 MED FILL — METHOTREXATE 25 MG/ML VIAL: 50 | 84 days supply | Qty: 10 | Fill #1

## 2016-07-27 MED FILL — HUMIRA 40 MG/0.8ML PSKT: 40 | 28 days supply | Qty: 2 | Fill #0

## 2016-08-08 MED FILL — FOLIC ACID 1 MG TABLET: 1 | 90 days supply | Qty: 180 | Fill #1

## 2016-08-08 MED FILL — PREMARIN 1.25 MG TABLET: 1.25 | 30 days supply | Qty: 30 | Fill #3

## 2016-08-08 MED FILL — ETODOLAC 400 MG TABLET: 400 | 30 days supply | Qty: 60 | Fill #3

## 2016-09-01 MED FILL — HUMIRA 40 MG/0.8ML PSKT: 40 | 28 days supply | Qty: 2 | Fill #1

## 2016-09-01 MED FILL — PREMARIN 1.25 MG TABLET: 1.25 | 30 days supply | Qty: 30 | Fill #4

## 2016-09-27 DIAGNOSIS — Z299 Encounter for prophylactic measures, unspecified: Secondary | ICD-10-CM | POA: Diagnosis not present

## 2016-09-27 DIAGNOSIS — Z6841 Body Mass Index (BMI) 40.0 and over, adult: Secondary | ICD-10-CM | POA: Diagnosis not present

## 2016-09-27 DIAGNOSIS — Z789 Other specified health status: Secondary | ICD-10-CM | POA: Diagnosis not present

## 2016-09-27 DIAGNOSIS — M069 Rheumatoid arthritis, unspecified: Secondary | ICD-10-CM | POA: Diagnosis not present

## 2016-09-27 DIAGNOSIS — J069 Acute upper respiratory infection, unspecified: Secondary | ICD-10-CM | POA: Diagnosis not present

## 2016-10-11 DIAGNOSIS — M069 Rheumatoid arthritis, unspecified: Secondary | ICD-10-CM | POA: Diagnosis not present

## 2016-10-11 DIAGNOSIS — Z713 Dietary counseling and surveillance: Secondary | ICD-10-CM | POA: Diagnosis not present

## 2016-10-11 DIAGNOSIS — J069 Acute upper respiratory infection, unspecified: Secondary | ICD-10-CM | POA: Diagnosis not present

## 2016-10-11 DIAGNOSIS — Z6841 Body Mass Index (BMI) 40.0 and over, adult: Secondary | ICD-10-CM | POA: Diagnosis not present

## 2016-10-11 DIAGNOSIS — Z299 Encounter for prophylactic measures, unspecified: Secondary | ICD-10-CM | POA: Diagnosis not present

## 2016-10-12 MED FILL — PREMARIN 1.25 MG TABLET: 1.25 | 30 days supply | Qty: 30 | Fill #5

## 2016-10-12 MED FILL — ETODOLAC 400 MG TABLET: 400 | 30 days supply | Qty: 60 | Fill #0

## 2016-10-12 MED FILL — HUMIRA 40 MG/0.8ML PSKT: 40 | 28 days supply | Qty: 2 | Fill #2

## 2016-10-18 DIAGNOSIS — Z79899 Other long term (current) drug therapy: Secondary | ICD-10-CM | POA: Diagnosis not present

## 2016-10-18 DIAGNOSIS — M150 Primary generalized (osteo)arthritis: Secondary | ICD-10-CM | POA: Diagnosis not present

## 2016-10-18 DIAGNOSIS — M0589 Other rheumatoid arthritis with rheumatoid factor of multiple sites: Secondary | ICD-10-CM | POA: Diagnosis not present

## 2016-10-18 DIAGNOSIS — Z6841 Body Mass Index (BMI) 40.0 and over, adult: Secondary | ICD-10-CM | POA: Diagnosis not present

## 2016-10-24 DIAGNOSIS — R05 Cough: Secondary | ICD-10-CM | POA: Diagnosis not present

## 2016-11-09 MED FILL — PREMARIN 1.25 MG TABLET: 1.25 | 30 days supply | Qty: 30 | Fill #6

## 2016-11-13 MED FILL — HUMIRA 40 MG/0.8ML PSKT: 40 | 28 days supply | Qty: 2 | Fill #3

## 2016-11-13 MED FILL — predniSONE 5 MG TABS: 5 | 12 days supply | Qty: 48 | Fill #0

## 2016-11-13 MED FILL — ETODOLAC 400 MG TABLET: 400 | 30 days supply | Qty: 60 | Fill #1

## 2016-11-13 MED FILL — METHOTREXATE 25 MG/ML VIAL: 50 | 84 days supply | Qty: 10 | Fill #0

## 2016-11-13 MED FILL — FOLIC ACID 1 MG TABLET: 1 | 90 days supply | Qty: 180 | Fill #2

## 2016-12-07 MED FILL — PREMARIN 1.25 MG TABLET: 1.25 | 30 days supply | Qty: 30 | Fill #7

## 2016-12-27 MED FILL — ETODOLAC 400 MG TABLET: 400 | 30 days supply | Qty: 60 | Fill #2

## 2017-01-09 ENCOUNTER — Telehealth: Payer: 59 | Admitting: Nurse Practitioner

## 2017-01-09 DIAGNOSIS — J019 Acute sinusitis, unspecified: Secondary | ICD-10-CM

## 2017-01-09 MED ORDER — FLUTICASONE PROPIONATE 50 MCG/ACT NA SUSP: 2.0000 | Freq: Every day | NASAL | 0 refills | Status: DC

## 2017-01-09 MED FILL — HUMIRA 40 MG/0.8ML PSKT: 40 | 28 days supply | Qty: 2 | Fill #4

## 2017-01-09 MED FILL — FLUTICASONE PROP 50 MCG SPR: 50 | 30 days supply | Qty: 16 | Fill #0

## 2017-01-09 MED FILL — PREMARIN 1.25 MG TABLET: 1.25 | 30 days supply | Qty: 30 | Fill #8

## 2017-01-09 NOTE — Progress Notes (Signed)
We are sorry that you are not feeling well.  Here is how we plan to help!  Based on what you have shared with me it looks like you have sinusitis.  Sinusitis is inflammation and infection in the sinus cavities of the head.  Based on your presentation I believe you most likely have Acute Viral Sinusitis.This is an infection most likely caused by a virus. There is not specific treatment for viral sinusitis other than to help you with the symptoms until the infection runs its course.  You may use an oral decongestant such as Mucinex D or if you have glaucoma or high blood pressure use plain Mucinex. Saline nasal spray help and can safely be used as often as needed for congestion, I have prescribed: Fluticasone nasal spray two sprays in each nostril twice a day.    Some authorities believe that zinc sprays or the use of Echinacea may shorten the course of your symptoms.  Sinus infections are not as easily transmitted as other respiratory infection, however we still recommend that you avoid close contact with loved ones, especially the very young and elderly.  Remember to wash your hands thoroughly throughout the day as this is the number one way to prevent the spread of infection! IF YOU ARE HAVING DIFFICULTY BREATHING AND SWALLOWING, YOU SHOULD GO TO THE ER.  Home Care:  Only take medications as instructed by your medical team.  Complete the entire course of an antibiotic.  Do not take these medications with alcohol.  A steam or ultrasonic humidifier can help congestion.  You can place a towel over your head and breathe in the steam from hot water coming from a faucet.  Avoid close contacts especially the very young and the elderly.  Cover your mouth when you cough or sneeze.  Always remember to wash your hands.  Get Help Right Away If:  You develop worsening fever or sinus pain.  You develop a severe head ache or visual changes.  Your symptoms persist after you have completed your  treatment plan.  Make sure you  Understand these instructions.  Will watch your condition.  Will get help right away if you are not doing well or get worse.  Your e-visit answers were reviewed by a board certified advanced clinical practitioner to complete your personal care plan.  Depending on the condition, your plan could have included both over the counter or prescription medications.  If there is a problem please reply  once you have received a response from your provider.  Your safety is important to [place].  If you have drug allergies check your prescription carefully.    You can use MyChart to ask questions about today's visit, request a non-urgent call back, or ask for a work or school excuse for 24 hours related to this e-Visit. If it has been greater than 24 hours you will need to follow up with your provider, or enter a new e-Visit to address those concerns.  You will get an e-mail in the next two days asking about your experience.  I hope that your e-visit has been valuable and will speed your recovery. Thank you for using e-visits.

## 2017-01-26 MED FILL — FOLIC ACID 1 MG TABLET: 1 | 90 days supply | Qty: 180 | Fill #3

## 2017-01-26 MED FILL — ETODOLAC 400 MG TABLET: 400 | 30 days supply | Qty: 60 | Fill #3

## 2017-01-30 MED FILL — HUMIRA 40 MG/0.8ML PSKT: 40 | 28 days supply | Qty: 2 | Fill #5

## 2017-01-31 DIAGNOSIS — M150 Primary generalized (osteo)arthritis: Secondary | ICD-10-CM | POA: Diagnosis not present

## 2017-01-31 DIAGNOSIS — Z79899 Other long term (current) drug therapy: Secondary | ICD-10-CM | POA: Diagnosis not present

## 2017-01-31 DIAGNOSIS — Z6841 Body Mass Index (BMI) 40.0 and over, adult: Secondary | ICD-10-CM | POA: Diagnosis not present

## 2017-01-31 DIAGNOSIS — M0589 Other rheumatoid arthritis with rheumatoid factor of multiple sites: Secondary | ICD-10-CM | POA: Diagnosis not present

## 2017-02-02 MED FILL — PREMARIN 1.25 MG TABLET: 1.25 | 30 days supply | Qty: 30 | Fill #9

## 2017-02-26 MED FILL — PREMARIN 1.25 MG TABLET: 1.25 | 30 days supply | Qty: 30 | Fill #10

## 2017-02-26 MED FILL — HUMIRA 40 MG/0.8ML PSKT: 40 | 28 days supply | Qty: 2 | Fill #6

## 2017-02-27 ENCOUNTER — Other Ambulatory Visit: Payer: Self-pay | Admitting: Pharmacist

## 2017-02-27 MED ORDER — ADALIMUMAB 40 MG/0.4ML ~~LOC~~ PSKT: 1.0000 | PREFILLED_SYRINGE | SUBCUTANEOUS | 3 refills | Status: DC

## 2017-02-27 MED FILL — ETODOLAC 400 MG TABLET: 400 | 30 days supply | Qty: 60 | Fill #0

## 2017-03-21 DIAGNOSIS — I890 Lymphedema, not elsewhere classified: Secondary | ICD-10-CM | POA: Diagnosis not present

## 2017-03-22 ENCOUNTER — Other Ambulatory Visit (HOSPITAL_COMMUNITY): Payer: Self-pay | Admitting: General Surgery

## 2017-03-23 ENCOUNTER — Other Ambulatory Visit (HOSPITAL_COMMUNITY): Payer: Self-pay | Admitting: General Surgery

## 2017-04-02 ENCOUNTER — Encounter: Payer: 59 | Attending: General Surgery | Admitting: Registered"

## 2017-04-02 ENCOUNTER — Encounter: Payer: Self-pay | Admitting: Registered"

## 2017-04-02 DIAGNOSIS — E669 Obesity, unspecified: Secondary | ICD-10-CM

## 2017-04-02 DIAGNOSIS — Z713 Dietary counseling and surveillance: Secondary | ICD-10-CM | POA: Diagnosis not present

## 2017-04-02 DIAGNOSIS — Z6841 Body Mass Index (BMI) 40.0 and over, adult: Secondary | ICD-10-CM | POA: Diagnosis not present

## 2017-04-02 NOTE — Progress Notes (Signed)
Pre-Op Assessment Visit:  Pre-Operative Sleeve Gastrectomy Surgery  Medical Nutrition Therapy:  Appt start time: 2:30  End time:  3:40  Patient was seen on 04/02/2017 for Pre-Operative Nutrition Assessment. Assessment and letter of approval faxed to [facility] Bariatric Surgery Program coordinator on 04/02/2017.   Pt expectation of surgery: reduce pain, be able to walk, to do more  Pt expectation of Dietitian: none stated  Start weight at [facility]: 408.7 BMI: 69.07   Pt states she's scared and her husband is very supportive. Pt states pasta make inflammation worse.  Pt states MD wants her to be less than 400 lbs. Pt has eliminated sodas from diet.   Per insurance, pt needs 0 SWL visits prior to surgery.    24 hr Dietary Recall: First Meal: egg sandwich (3 eggs, mayo, and bread) Snack: none Second Meal: skips or protein bar or baked potato Snack: none Third Meal: hamburger/steak, mashed potatoes/baked potato or salad or spaghetti Snack: Fiber One brownies Beverages: water, lemonade sometimes  Encouraged to engage in 150 minutes of moderate physical activity including cardiovascular and weight baring weekly  Handouts given during visit include:  . Pre-Op Goals . Bariatric Surgery Protein Shakes . Vitamin and Mineral Recommendations  During the appointment today the following Pre-Op Goals were reviewed with the patient: . Maintain or lose weight as instructed by your surgeon . Make healthy food choices . Begin to limit portion sizes . Limited concentrated sugars and fried foods . Keep fat/sugar in the single digits per serving on food labels . Practice CHEWING your food  (aim for 30 chews per bite or until applesauce consistency) . Practice not drinking 15 minutes before, during, and 30 minutes after each meal/snack . Avoid all carbonated beverages  . Avoid/limit caffeinated beverages  . Avoid all sugar-sweetened beverages . Consume 3 meals per day; eat every 3-5  hours . Make a list of non-food related activities . Aim for 64-100 ounces of FLUID daily  . Aim for at least 60-80 grams of PROTEIN daily . Look for a liquid protein source that contain ?15 g protein and ?5 g carbohydrate  (ex: shakes, drinks, shots) . Physical activity is an important part of a healthy lifestyle so keep it moving!  Follow diet recommendations listed below Energy and Macronutrient Recommendations: Calories: 1800 Carbohydrate: 200 Protein: 135 Fat: 50  Demonstrated degree of understanding via:  Teach Back   Teaching Method Utilized:  Visual Auditory Hands on  Barriers to learning/adherence to lifestyle change: none  Patient to call the Nutrition and Diabetes Education Services to enroll in Pre-Op and Post-Op Nutrition Education when surgery date is scheduled.

## 2017-04-04 MED FILL — ETODOLAC 400 MG TABLET: 400 | 30 days supply | Qty: 60 | Fill #1

## 2017-04-04 MED FILL — HUMIRA 40 MG/0.8ML PSKT: 40 | 28 days supply | Qty: 2 | Fill #7

## 2017-04-04 MED FILL — PREMARIN 1.25 MG TABLET: 1.25 | 30 days supply | Qty: 30 | Fill #11

## 2017-04-11 MED FILL — METHOTREXATE 25 MG/ML VIAL: 50 | 84 days supply | Qty: 10 | Fill #1

## 2017-04-23 ENCOUNTER — Ambulatory Visit (HOSPITAL_COMMUNITY)
Admission: RE | Admit: 2017-04-23 | Discharge: 2017-04-23 | Disposition: A | Discharge status: Home / Self Care | Payer: 59 | Source: Ambulatory Visit | Attending: General Surgery | Admitting: General Surgery

## 2017-04-23 ENCOUNTER — Other Ambulatory Visit: Payer: Self-pay

## 2017-04-23 DIAGNOSIS — Z6841 Body Mass Index (BMI) 40.0 and over, adult: Secondary | ICD-10-CM | POA: Insufficient documentation

## 2017-04-23 DIAGNOSIS — R9431 Abnormal electrocardiogram [ECG] [EKG]: Secondary | ICD-10-CM | POA: Insufficient documentation

## 2017-04-23 DIAGNOSIS — Z01818 Encounter for other preprocedural examination: Secondary | ICD-10-CM | POA: Insufficient documentation

## 2017-04-23 DIAGNOSIS — K449 Diaphragmatic hernia without obstruction or gangrene: Secondary | ICD-10-CM | POA: Diagnosis not present

## 2017-04-27 ENCOUNTER — Ambulatory Visit (INDEPENDENT_AMBULATORY_CARE_PROVIDER_SITE_OTHER): Payer: 59 | Admitting: Certified Nurse Midwife

## 2017-04-27 ENCOUNTER — Encounter: Payer: Self-pay | Admitting: Certified Nurse Midwife

## 2017-04-27 VITALS — BP 110/68 | HR 70 | Resp 20 | Ht 65.0 in | Wt 398.0 lb

## 2017-04-27 DIAGNOSIS — N951 Menopausal and female climacteric states: Secondary | ICD-10-CM | POA: Diagnosis not present

## 2017-04-27 DIAGNOSIS — Z01419 Encounter for gynecological examination (general) (routine) without abnormal findings: Secondary | ICD-10-CM

## 2017-04-27 DIAGNOSIS — Z79890 Hormone replacement therapy (postmenopausal): Secondary | ICD-10-CM

## 2017-04-27 NOTE — Patient Instructions (Signed)

## 2017-04-27 NOTE — Progress Notes (Signed)
51 y.o. [id number] Married  Caucasian Fe here for annual exam. Denies vaginal bleeding or vaginal dryness. [name] working well for hot flashes, desires continuance.  Planning gastric sleeve surgery this fall. Going through all the testing and nutritional counseling now. Denies any yeast symptoms today, has had problems in past.  Sees PCP yearly for exam and labs. Continues with rheumatology for RA management of Methotrexate. Using Marijuana occasional for knee pain. Having difficulty walking now with knee issus and weight. Aware mammogram overdue. No other health issues today.   Patient's last menstrual period was 04/25/2007.          Sexually active: Yes.    The current method of family planning is status post hysterectomy.    Exercising: Yes.    gardening Smoker:  Marijuana occ  Health Maintenance: Pap:  03-22-12 neg History of Abnormal Pap: no MMG:  02-10-15 category a density birads 1:neg, maybe 2017. Pt to sign release Self Breast exams: no Colonoscopy:  2016 polyps f/u 20yrs BMD:   none TDaP:  2010 Shingles: no Pneumonia: no Hep C and HIV: HIV neg yrs ago Labs: none   reports that she has quit smoking. She has never used smokeless tobacco. She reports that she drinks about 0.6 oz of alcohol per week . She reports that she uses drugs, including Marijuana.  Past Medical History:  Diagnosis Date  . Condyloma   . Morbid obesity ([place])   . Rheumatoid arthritis ([place]) 2005  . STD (sexually transmitted disease)    + trich  . Tubal ectopic pregnancy    times 2    Past Surgical History:  Procedure Laterality Date  . ANKLE SURGERY    . CHOLECYSTECTOMY  04/25/07  . COLONOSCOPY N/A 03/24/2015   NWG:NFAOZ external hemorrhoids/moderate sized internal hemorrhoids/moderate diverticulosis in the sigmoid colon  . HEMORRHOID BANDING N/A 03/24/2015   Procedure: HEMORRHOID BANDING;  Surgeon: Danie Binder, MD;  [facility];  Service: Endoscopy;  Laterality: N/A;  . TOTAL ABDOMINAL HYSTERECTOMY   04/25/07   BSO    Current Outpatient Prescriptions  Medication Sig Dispense Refill  . Bumetanide (BUMEX PO) Take 1 tablet by mouth daily as needed (fluid). occ    . cetirizine (ZYRTEC) 10 MG tablet Take 10 mg by mouth daily.    . Cholecalciferol (VITAMIN D PO) Take 1 tablet by mouth daily.     Marland Kitchen CRANBERRY PO Take 1 tablet by mouth daily.     Marland Kitchen estrogens, conjugated, (PREMARIN) 1.25 MG tablet Take 1 tablet (1.25 mg total) by mouth daily. 30 tablet 12  . etodolac (LODINE) 400 MG tablet Take 400 mg by mouth 2 (two) times daily.    . Flaxseed, Linseed, (FLAXSEED OIL PO) Take 1 capsule by mouth daily.     . folic acid (FOLVITE) 1 MG tablet Take 1 mg by mouth daily.    Marland Kitchen HUMIRA 40 MG/0.8ML PSKT INJECT 0 8 MLS INTO THE SKIN EVERY 14  FOURTEEN  DAYS  3  . KRILL OIL PO Take by mouth daily.    . methotrexate 1 G injection Inject into the vein every 7 (seven) days.    . methotrexate 50 MG/2ML injection   0  . milk thistle 175 MG tablet Take 175 mg by mouth daily.     . nitrofurantoin, macrocrystal-monohydrate, (MACROBID) 100 MG capsule Take 1 capsule (100 mg total) by mouth 2 (two) times daily. 1 po BId 14 capsule 0  . Probiotic Product (PROBIOTIC PO) Take 1 capsule by  mouth daily.     Marland Kitchen UNABLE TO FIND daily. Herbal meds     No current facility-administered medications for this visit.     Family History  Problem Relation Age of Onset  . Cancer Father   . Cirrhosis Father   . Colon cancer Neg Hx     ROS:  Pertinent items are noted in HPI.  Otherwise, a comprehensive ROS was negative.  Exam:   BP 110/68   Pulse 70   Resp 20   Ht 5\' 5"  (1.651 m)   Wt (!) 398 lb (180.5 kg)   LMP 04/25/2007   BMI 66.23 kg/m  Height: 5\' 5"  (165.1 cm) Ht Readings from Last 3 Encounters:  04/27/17 5\' 5"  (1.651 m)  04/02/17 5' 4.5" (1.638 m)  04/26/16 5\' 5"  (1.651 m)    General appearance: alert, cooperative and appears stated age Head: Normocephalic, without obvious abnormality, atraumatic Neck: no  adenopathy, supple, symmetrical, trachea midline and thyroid normal to inspection and palpation Lungs: clear to auscultation bilaterally Breasts: normal appearance, no masses or tenderness, Inspection negative, No nipple retraction or dimpling, No nipple discharge or bleeding Heart: regular rate and rhythm Abdomen: soft, non-tender; no masses,  no organomegaly Extremities: extremities normal, atraumatic, no cyanosis or edema Skin: Skin color, texture, turgor normal. No rashes or lesions Lymph nodes: Cervical, supraclavicular, and axillary nodes normal. No abnormal inguinal nodes palpated Neurologic: Grossly normal   Pelvic: External genitalia:  no lesions              Urethra:  normal appearing urethra with no masses, tenderness or lesions              Bartholin's and Skene's: normal                 Vagina: normal appearing vagina with normal color and discharge, no lesions              Cervix: absent              Pap taken: No. Bimanual Exam:  Uterus:  uterus absent              Adnexa: normal adnexa and no mass, fullness, tenderness               Rectovaginal: Confirms               Anus:  normal sphincter tone, no lesions  Chaperone present: yes  A:  Well Woman with normal exam  Menopausal on [name] working well  Morbid Obesity in process of getting ready for Gastric Sleeve surgery  RA with Rheumatology management, lower extremities pain has increased  Mammogram overdue    P:   Reviewed health and wellness pertinent to exam  Aware of need to have mammogram current for continuation of [name]. Patient will schedule. She has one month left on refill. Discussed stopping [name] for surgery and concerns of clotting issue. Encouraged to discuss with surgeon prior to surgery.  Continue follow up with MD as indicated.  Pap smear: no  counseled on breast self exam, mammography screening, adequate intake of calcium and vitamin D, diet and exercise, and to follow all dietary instructions given for  successful surgery and weight loss Wish well with surgery  return annually or prn  An After Visit Summary was printed and given to the patient.

## 2017-05-01 ENCOUNTER — Other Ambulatory Visit: Payer: Self-pay | Admitting: Certified Nurse Midwife

## 2017-05-01 ENCOUNTER — Telehealth: Payer: Self-pay

## 2017-05-01 DIAGNOSIS — N951 Menopausal and female climacteric states: Secondary | ICD-10-CM

## 2017-05-01 MED FILL — ETODOLAC 400 MG TABLET: 400 | 30 days supply | Qty: 60 | Fill #2

## 2017-05-01 MED FILL — FOLIC ACID 1 MG TABLET: 1 | 90 days supply | Qty: 180 | Fill #0

## 2017-05-01 NOTE — Telephone Encounter (Signed)
Patient needs current mammogram, received hers for 2016, only

## 2017-05-01 NOTE — Telephone Encounter (Signed)
Pt aware we have 2015 & 2016 mammogram results. Pt was told to schedule her 2018 mammogram. Pt states she will have it done. Pt is aware of importance of having this done.

## 2017-05-02 ENCOUNTER — Other Ambulatory Visit: Payer: Self-pay | Admitting: Pharmacist

## 2017-05-02 MED ORDER — HUMIRA 40 MG/0.8ML ~~LOC~~ PSKT: 0.8000 mL | PREFILLED_SYRINGE | SUBCUTANEOUS | 3 refills | Status: DC

## 2017-05-02 MED FILL — HUMIRA 40 MG/0.8ML PSKT: 40 | 28 days supply | Qty: 2 | Fill #0

## 2017-05-03 ENCOUNTER — Other Ambulatory Visit: Payer: Self-pay | Admitting: Pharmacist

## 2017-05-03 ENCOUNTER — Other Ambulatory Visit: Payer: Self-pay | Admitting: Certified Nurse Midwife

## 2017-05-03 DIAGNOSIS — N951 Menopausal and female climacteric states: Secondary | ICD-10-CM

## 2017-05-03 DIAGNOSIS — Z6841 Body Mass Index (BMI) 40.0 and over, adult: Secondary | ICD-10-CM | POA: Diagnosis not present

## 2017-05-03 DIAGNOSIS — M0589 Other rheumatoid arthritis with rheumatoid factor of multiple sites: Secondary | ICD-10-CM | POA: Diagnosis not present

## 2017-05-03 DIAGNOSIS — M150 Primary generalized (osteo)arthritis: Secondary | ICD-10-CM | POA: Diagnosis not present

## 2017-05-03 DIAGNOSIS — Z79899 Other long term (current) drug therapy: Secondary | ICD-10-CM | POA: Diagnosis not present

## 2017-05-03 MED ORDER — HUMIRA 40 MG/0.8ML ~~LOC~~ PSKT: 0.8000 mL | PREFILLED_SYRINGE | SUBCUTANEOUS | 5 refills | Status: DC

## 2017-05-03 NOTE — Telephone Encounter (Signed)
eScribe request from [place] for refill on [place] filled - 04/26/16, #30 X 12 RF Last AEX - 04/27/17 Next AEX - 05/07/18 Last MMG - 02/10/15, Bi-Rads 1:  Negative, per Cecille Rubin at [facility], pt is scheduled for 05/09/17 @ 12:45 pm for screening mammogram.  Prescription expired. Please advise refills. Thank you.

## 2017-05-04 MED FILL — PREMARIN 1.25 MG TABLET: 1.25 | 30 days supply | Qty: 30 | Fill #0

## 2017-05-04 NOTE — Telephone Encounter (Signed)
Will do one refill only until results in

## 2017-05-09 DIAGNOSIS — Z1231 Encounter for screening mammogram for malignant neoplasm of breast: Secondary | ICD-10-CM | POA: Diagnosis not present

## 2017-05-22 ENCOUNTER — Ambulatory Visit (INDEPENDENT_AMBULATORY_CARE_PROVIDER_SITE_OTHER): Payer: 59 | Admitting: Psychiatry

## 2017-05-22 DIAGNOSIS — F509 Eating disorder, unspecified: Secondary | ICD-10-CM | POA: Diagnosis not present

## 2017-05-24 DIAGNOSIS — Z01810 Encounter for preprocedural cardiovascular examination: Secondary | ICD-10-CM | POA: Insufficient documentation

## 2017-05-25 ENCOUNTER — Encounter: Payer: Self-pay | Admitting: Interventional Cardiology

## 2017-05-25 ENCOUNTER — Ambulatory Visit (INDEPENDENT_AMBULATORY_CARE_PROVIDER_SITE_OTHER): Payer: 59 | Admitting: Interventional Cardiology

## 2017-05-25 VITALS — BP 116/88 | HR 96 | Ht 65.0 in | Wt 390.4 lb

## 2017-05-25 DIAGNOSIS — M069 Rheumatoid arthritis, unspecified: Secondary | ICD-10-CM

## 2017-05-25 DIAGNOSIS — R600 Localized edema: Secondary | ICD-10-CM

## 2017-05-25 DIAGNOSIS — Z01810 Encounter for preprocedural cardiovascular examination: Secondary | ICD-10-CM | POA: Diagnosis not present

## 2017-05-25 DIAGNOSIS — R0602 Shortness of breath: Secondary | ICD-10-CM | POA: Diagnosis not present

## 2017-05-25 LAB — PRO B NATRIURETIC PEPTIDE: NT-Pro BNP: 62 pg/mL (ref 0–249)

## 2017-05-25 LAB — LIPID PANEL
CHOL/HDL RATIO: 2.6 ratio (ref 0.0–4.4)
Cholesterol, Total: 123 mg/dL (ref 100–199)
HDL: 48 mg/dL (ref >39)
LDL CALC: 47 mg/dL (ref 0–99)
Triglycerides: 140 mg/dL (ref 0–149)
VLDL CHOLESTEROL CAL: 28 mg/dL (ref 5–40)

## 2017-05-25 NOTE — Patient Instructions (Signed)
Medication Instructions:  None  Labwork: BNP and Lipids today  Testing/Procedures: Your physician has requested that you have an echocardiogram. Echocardiography is a painless test that uses sound waves to create images of your heart. It provides your doctor with information about the size and shape of your heart and how well your heart's chambers and valves are working. This procedure takes approximately one hour. There are no restrictions for this procedure.   Follow-Up: Your physician recommends that you schedule a follow-up appointment as needed with Dr. Tamala Julian.    Any Other Special Instructions Will Be Listed Below (If Applicable).     If you need a refill on your cardiac medications before your next appointment, please call your pharmacy.

## 2017-05-25 NOTE — Progress Notes (Signed)
Cardiology Office Note    Date:  05/25/2017   ID:  Sarah Dean, DOB Sep 28, 1966, MRN [id number]  PCP:  Monico Blitz, MD  Cardiologist: Sinclair Grooms, MD   Chief Complaint  Patient presents with  . Obesity  . Advice Only    Preop cardiac clearance before bariatric surgery.    History of Present Illness:  Sarah Dean is a 51 y.o. female with morbid obesity referred by Dr. Herb Grays Kinsinger  for preoperative cardiac evaluation prior to bariatric surgery.  Sarah Dean is a very pleasant female with morbid obesity. 10 years ago she was up to 500 pounds. Subsequent to that she began exercising and had changes in her diet resulting in loss of more than 225 pounds during her weight near 250. Subsequent life stresses led to recurrent weight gain. She has now decided to pursue bariatric surgery. She has dyspnea on exertion. She feels this is related to deconditioning. She denies orthopnea, PND, and palpitations. She denies chest pain. She is having difficulty ambulating because of severe bilateral knee arthritis. She has no prior history of cardiac disease, diabetes, tobacco abuse, lipidemia, or other vascular related issues.  For greater than 10 years she has had chronic bilateral lower extremity swelling which is been diagnosis lymphedema. She has chronic lichenification of the skin in her lower extremities.  She snores. She denies excessive daytime sleepiness.    Past Medical History:  Diagnosis Date  . Condyloma   . Morbid obesity ([place])   . Rheumatoid arthritis ([place]) 2005  . STD (sexually transmitted disease)    + trich  . Tubal ectopic pregnancy    times 2    Past Surgical History:  Procedure Laterality Date  . ANKLE SURGERY    . CHOLECYSTECTOMY  04/25/07  . COLONOSCOPY N/A 03/24/2015   KDT:OIZTI external hemorrhoids/moderate sized internal hemorrhoids/moderate diverticulosis in the sigmoid colon  . HEMORRHOID BANDING N/A 03/24/2015   Procedure: HEMORRHOID BANDING;   Surgeon: Danie Binder, MD;  [facility];  Service: Endoscopy;  Laterality: N/A;  . TOTAL ABDOMINAL HYSTERECTOMY  04/25/07   BSO    Current Medications: Outpatient Medications Prior to Visit  Medication Sig Dispense Refill  . Bumetanide (BUMEX PO) Take 1 tablet by mouth daily as needed (fluid). occ    . cetirizine (ZYRTEC) 10 MG tablet Take 10 mg by mouth daily.    . Cholecalciferol (VITAMIN D PO) Take 1 tablet by mouth daily.     Marland Kitchen CRANBERRY PO Take 1 tablet by mouth daily.     Marland Kitchen etodolac (LODINE) 400 MG tablet Take 400 mg by mouth 2 (two) times daily.    . Flaxseed, Linseed, (FLAXSEED OIL PO) Take 1 capsule by mouth daily.     . folic acid (FOLVITE) 1 MG tablet Take 1 mg by mouth daily.    Marland Kitchen HUMIRA 40 MG/0.8ML PSKT Inject 0.8 mLs (40 mg total) into the skin every 14 (fourteen) days. For 28 days 2 each 5  . KRILL OIL PO Take by mouth daily.    . methotrexate 1 G injection Inject into the vein every 7 (seven) days.    . milk thistle 175 MG tablet Take 175 mg by mouth daily.     Marland Kitchen PREMARIN 1.25 MG tablet TAKE 1 TABLET BY MOUTH DAILY. 30 tablet 0  . Probiotic Product (PROBIOTIC PO) Take 1 capsule by mouth daily.     Marland Kitchen UNABLE TO FIND Take Herbal meds by mouth daily.    Marland Kitchen  methotrexate 50 MG/2ML injection   0  . nitrofurantoin, macrocrystal-monohydrate, (MACROBID) 100 MG capsule Take 1 capsule (100 mg total) by mouth 2 (two) times daily. 1 po BId (Patient not taking: Reported on 05/25/2017) 14 capsule 0   No facility-administered medications prior to visit.      Allergies:   Patient has no known allergies.   Social History   Social History  . Marital status: Married    Spouse name: N/A  . Number of children: N/A  . Years of education: N/A   Social History Main Topics  . Smoking status: Former Research scientist (life sciences)  . Smokeless tobacco: Never Used     Comment: in high school  . Alcohol use 0.6 oz/week    1 Standard drinks or equivalent per week  . Drug use: Yes    Types: Marijuana       Comment: occ  . Sexual activity: Yes    Partners: Male    Birth control/ protection: Surgical     Comment: TAH   Other Topics Concern  . None   Social History Narrative  . None     Family History:  The patient's family history includes Cancer in her father; Cirrhosis in her father.  no history of sudden death, aneurysm, diabetes, bypass surgery, stents, or CHF.   ROS:   Please see the history of present illness.    She has rheumatoid arthritis for which she takes anti-inflammatory therapy including Humira and methotrexate.  All other systems reviewed and are negative.   PHYSICAL EXAM:   VS:  BP 116/88 (BP Location: Right Arm)   Pulse 96   Ht 5\' 5"  (1.651 m)   Wt (!) 390 lb 6.4 oz (177.1 kg)   LMP 04/25/2007   BMI 64.97 kg/m    GEN: Morbidly obesewell developed, in no acute distress  HEENT: normal  Neck: no JVD, carotid bruits, or masses Cardiac: RRR; no murmurs, rubs, or gallops. 2-3+ edema ankles to knees bilaterally with skin lichenification.  Respiratory:  clear to auscultation bilaterally, normal work of breathing GI: soft, nontender, nondistended, + BS MS: no deformity or atrophy  Skin: warm and dry, no rash Neuro:  Alert and Oriented x 3, Strength and sensation are intact Psych: euthymic mood, full affect  Wt Readings from Last 3 Encounters:  05/25/17 (!) 390 lb 6.4 oz (177.1 kg)  04/27/17 (!) 398 lb (180.5 kg)  04/02/17 (!) 408 lb 11.2 oz (185.4 kg)      Studies/Labs Reviewed:   EKG:  EKG   recent EKG performed 04/23/2017 was reviewed personally and reveals sinus rhythm with prominent voltage. No ST-T wave changes. No evidence of prior infarction.  Recent Labs: No results found for requested labs within last 8760 hours.   Lipid Panel    Component Value Date/Time   CHOL 147 04/08/2014 1637   TRIG 149 04/08/2014 1637   HDL 57 04/08/2014 1637   CHOLHDL 2.6 04/08/2014 1637   VLDL 30 04/08/2014 1637   LDLCALC 60 04/08/2014 1637    Additional  studies/ records that were reviewed today include:  Remote lipid panel reveals an excellent profile.    ASSESSMENT:    1. SOB (shortness of breath)   2. Pre-operative cardiovascular examination   3. Lower extremity edema   4. Morbid obesity ([place])   5. Rheumatoid arthritis involving both knees, unspecified rheumatoid factor presence ([place])      PLAN:  In order of problems listed above:  1. BNP and 2-D Doppler echocardiogram will  be performed to exclude cardiac etiology for dyspnea. Based on her clinical profile, dyspnea is highly likely to be related to deconditioning and obesity. 2. Assuming no surprises on the echo or blood work, my assessment is that she will be relatively low risk for cardiovascular complications with bariatric surgery. 3. The lower extremity edema appears to be related to either chronic venous insufficiency or lymphedema. I do not believe it is related to heart failure/pulmonary hypertension. The echo will help assess right heart and pulmonary pressures. 4. The obesity may be associated with sleep apnea. She does snore. She has no other specific complaints suggesting severe sleep apnea.  If the patient's lipid panel, echocardiogram, and BNP provides no significant abnormalities, she will be cleared for the upcoming bariatric surgical procedure at relatively low risk estimated to be less than 2-3% for cardiovascular complications with the most likely if any being atrial arrhythmias.  Medication Adjustments/Labs and Tests Ordered: Current medicines are reviewed at length with the patient today.  Concerns regarding medicines are outlined above.  Medication changes, Labs and Tests ordered today are listed in the Patient Instructions below. Patient Instructions  Medication Instructions:  None  Labwork: BNP and Lipids today  Testing/Procedures: Your physician has requested that you have an echocardiogram. Echocardiography is a painless test that uses sound waves to  create images of your heart. It provides your doctor with information about the size and shape of your heart and how well your heart's chambers and valves are working. This procedure takes approximately one hour. There are no restrictions for this procedure.   Follow-Up: Your physician recommends that you schedule a follow-up appointment as needed with Dr. Tamala Julian.    Any Other Special Instructions Will Be Listed Below (If Applicable).     If you need a refill on your cardiac medications before your next appointment, please call your pharmacy.      Signed, Sinclair Grooms, MD  05/25/2017 11:01 AM    [facility] [address], [place], [place]  [address] Phone: [phone]; Fax: [phone]

## 2017-05-29 ENCOUNTER — Other Ambulatory Visit: Payer: Self-pay | Admitting: Certified Nurse Midwife

## 2017-05-29 DIAGNOSIS — N951 Menopausal and female climacteric states: Secondary | ICD-10-CM

## 2017-05-29 MED FILL — ETODOLAC 400 MG TABLET: 400 | 30 days supply | Qty: 60 | Fill #0

## 2017-05-29 MED FILL — HUMIRA 40 MG/0.8ML PSKT: 40 | 28 days supply | Qty: 2 | Fill #1

## 2017-05-29 NOTE — Telephone Encounter (Signed)
Medication refill request: premarin  Last AEX:  04/27/17 DL Next AEX: 05/07/18 DL Last MMG (if hormonal medication request): 02/10/15 [id number]:neg  Refill authorized: 05/04/17 #[id number]. Today please advise.   Called [facility] patient had MMG on 02/07/2017 and it was normal. They will fax report to [place].   Routed to Dr. Sabra Heck.

## 2017-06-01 ENCOUNTER — Other Ambulatory Visit: Payer: Self-pay

## 2017-06-01 ENCOUNTER — Ambulatory Visit (HOSPITAL_COMMUNITY): Payer: 59 | Attending: Cardiology

## 2017-06-01 DIAGNOSIS — I77810 Thoracic aortic ectasia: Secondary | ICD-10-CM | POA: Diagnosis not present

## 2017-06-01 DIAGNOSIS — R0602 Shortness of breath: Secondary | ICD-10-CM | POA: Insufficient documentation

## 2017-06-01 DIAGNOSIS — Z6841 Body Mass Index (BMI) 40.0 and over, adult: Secondary | ICD-10-CM | POA: Diagnosis not present

## 2017-06-11 MED FILL — PREMARIN 1.25 MG TABLET: 1.25 | 30 days supply | Qty: 30 | Fill #0

## 2017-06-20 ENCOUNTER — Encounter: Payer: Self-pay | Admitting: Certified Nurse Midwife

## 2017-06-26 ENCOUNTER — Ambulatory Visit (INDEPENDENT_AMBULATORY_CARE_PROVIDER_SITE_OTHER): Payer: 59 | Admitting: Psychiatry

## 2017-06-26 DIAGNOSIS — F509 Eating disorder, unspecified: Secondary | ICD-10-CM

## 2017-07-03 MED FILL — ETODOLAC 400 MG TABLET: 400 | 30 days supply | Qty: 60 | Fill #1

## 2017-07-03 MED FILL — BUMETANIDE 1 MG TABLET: 1 | 30 days supply | Qty: 30 | Fill #0

## 2017-07-03 MED FILL — HUMIRA 40 MG/0.8ML PSKT: 40 | 28 days supply | Qty: 2 | Fill #2

## 2017-07-05 MED FILL — PREMARIN 1.25 MG TABLET: 1.25 | 30 days supply | Qty: 30 | Fill #1

## 2017-07-10 MED FILL — METHOTREXATE 25 MG/ML VIAL: 250 | 90 days supply | Qty: 10 | Fill #0

## 2017-07-23 ENCOUNTER — Telehealth: Payer: 59 | Admitting: Nurse Practitioner

## 2017-07-23 DIAGNOSIS — B373 Candidiasis of vulva and vagina: Secondary | ICD-10-CM

## 2017-07-23 DIAGNOSIS — B3731 Acute candidiasis of vulva and vagina: Secondary | ICD-10-CM

## 2017-07-23 MED ORDER — FLUCONAZOLE 150 MG PO TABS: 150.0000 mg | ORAL_TABLET | Freq: Once | ORAL | 0 refills | Status: AC

## 2017-07-23 MED FILL — FLUCONAZOLE 150 MG TABLET: 150 | 1 days supply | Qty: 1 | Fill #0

## 2017-07-23 NOTE — Progress Notes (Signed)

## 2017-07-27 ENCOUNTER — Encounter: Payer: Self-pay | Admitting: *Deleted

## 2017-07-27 IMAGING — RF DG UGI W/ KUB
8 of 10 series · 12 of 24 positions shown · non-contrast
Comparison: CT scan 03/13/2007

CLINICAL DATA: Preop for bariatric surgery (gastric sleeve
procedure).

EXAM:
UPPER GI SERIES WITH KUB
TECHNIQUE: After obtaining a scout radiograph a routine upper GI series was
performed using thin density barium
FLUOROSCOPY TIME:  Fluoroscopy Time:  4 minutes and 6 seconds
Radiation Exposure Index (if provided by the fluoroscopic device):
127 mGy
Number of Acquired Spot Images: 0

[Series 2: cp_standard · 0.51mm/px · 2 of 40 frames shown (1 of 8)]
[frame 1/40]
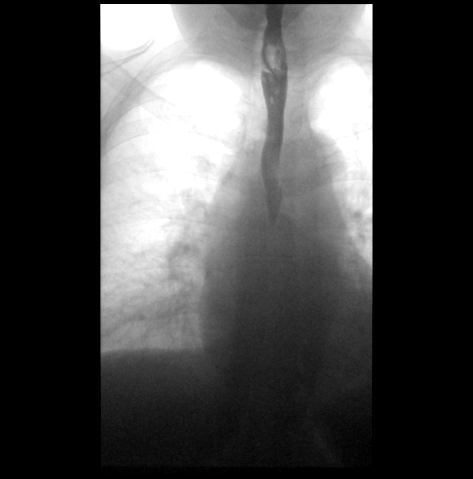
[frame 35/40]
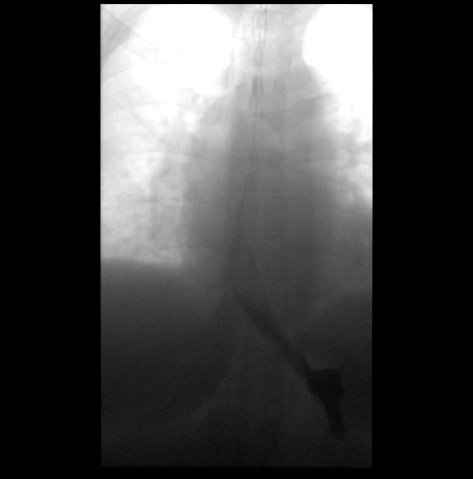

[Series 3: cp_standard · 0.51mm/px · 1 of 37 frames shown (2 of 8)]
[frame 19/37]
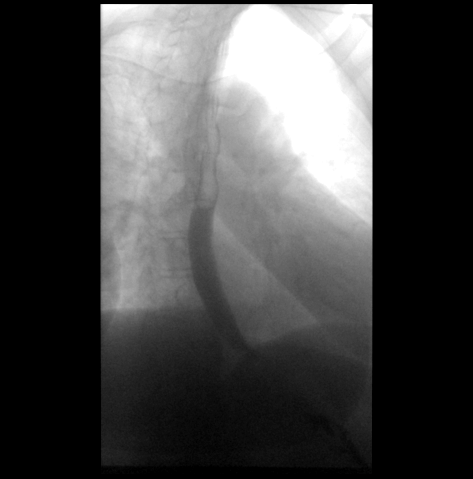

[Series 4: cp_standard · 0.51mm/px · 2 of 32 frames shown (3 of 8)]
[frame 5/32]
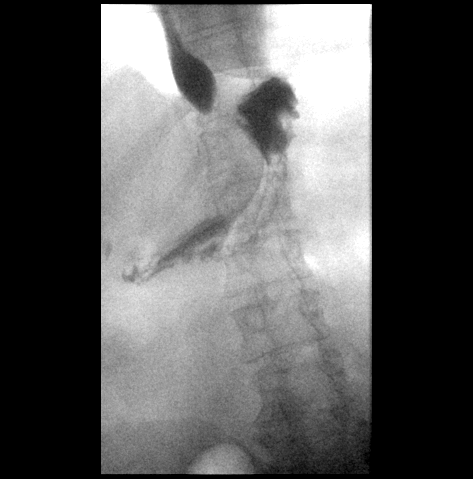
[frame 25/32]
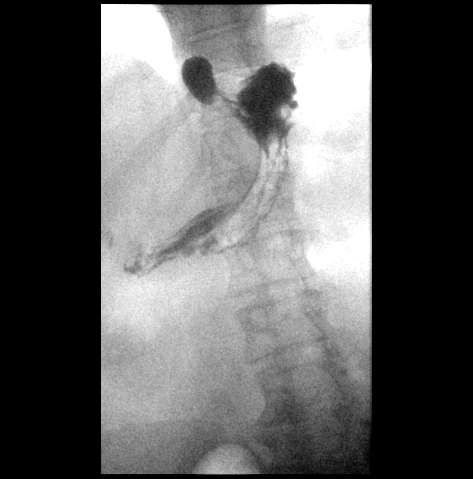

[Series 5: cp_standard · 0.51mm/px · 2 of 81 frames shown (4 of 8)]
[frame 13/81]
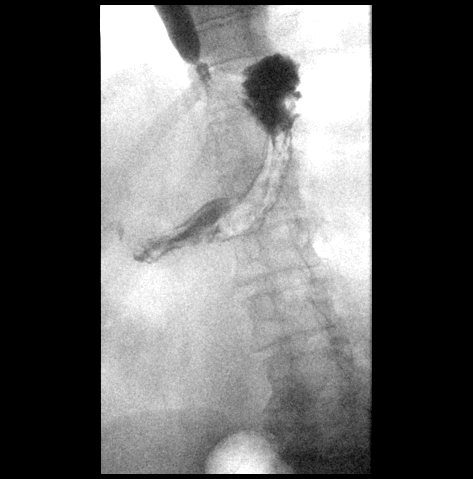
[frame 69/81]
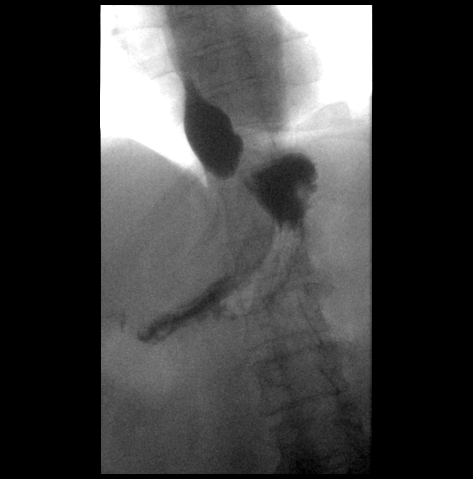

[Series 6: cp_standard · 0.51mm/px · 1 of 66 frames shown (5 of 8)]
[frame 34/66]
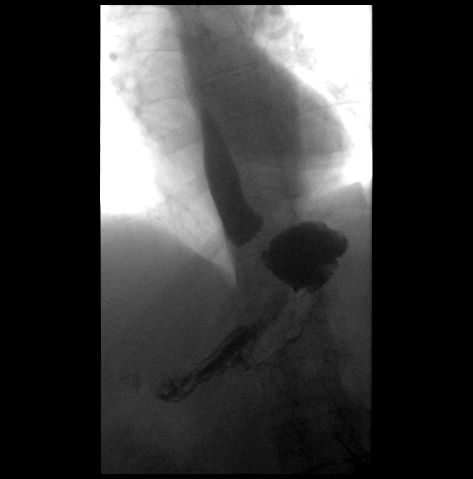

[Series 7: cp_standard · 0.51mm/px · 2 of 86 frames shown (6 of 8)]
[frame 13/86]
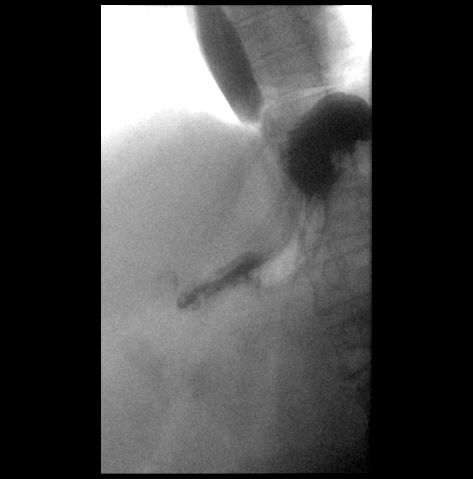
[frame 53/86]
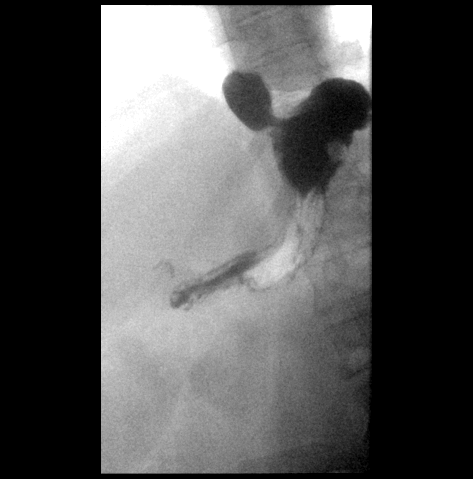

[Series 9: cp_standard · 0.26mm/px · 1 of 1 slices shown (7 of 8)]
[im 1/1]
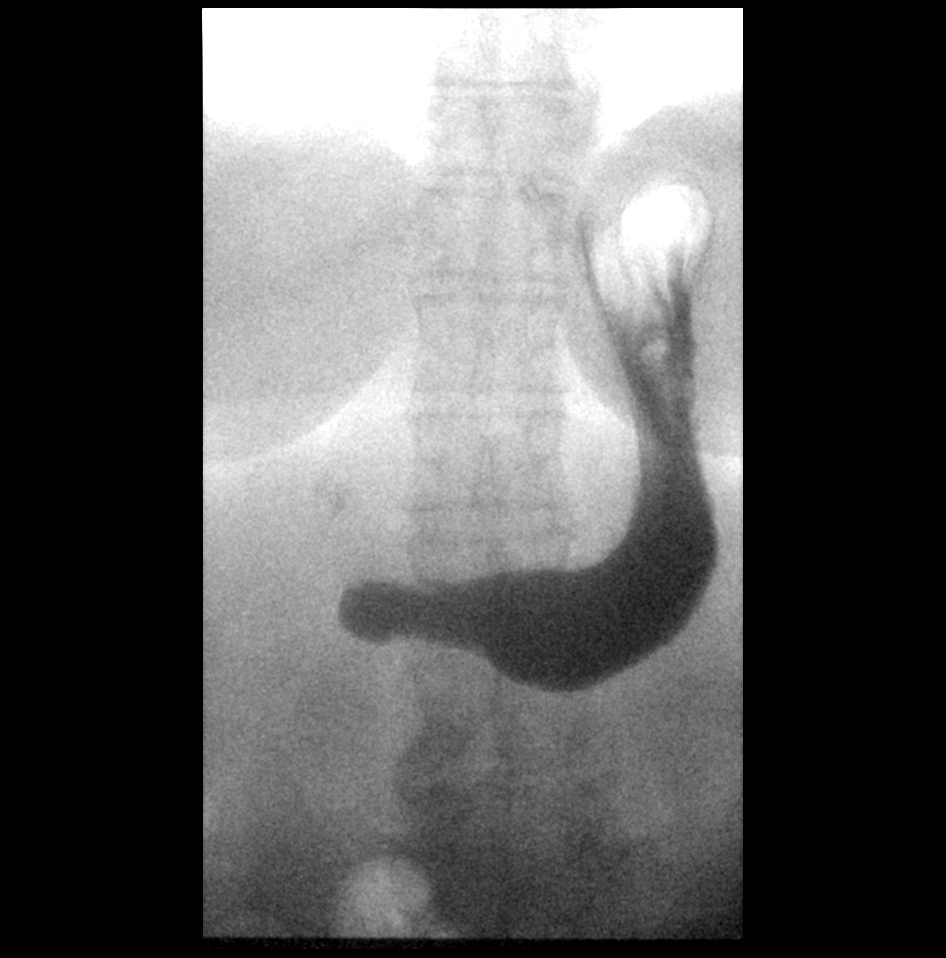

[Series 11: cp_standard · 0.26mm/px · 1 of 1 slices shown (8 of 8)]
[im 1/1]
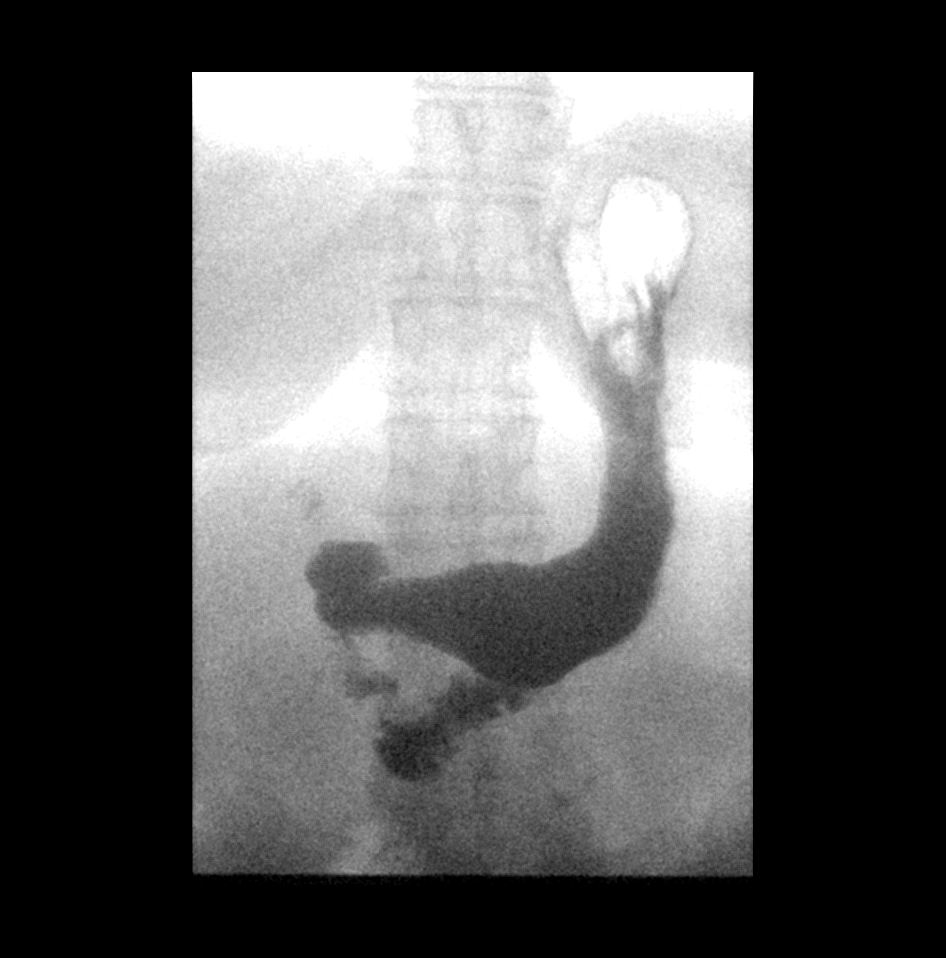

[12 of 24 positions shown; findings below may reference images not displayed]

FINDINGS: Normal esophageal motility. No intrinsic or extrinsic lesions of the
esophagus were identified. Small sliding-type hiatal hernia noted
but no GE reflux was demonstrated.

The stomach, duodenum bulb and C-loop are unremarkable. The
duodenaljejunal junction is in its normal anatomic location.
IMPRESSION: Very small sliding-type hiatal hernia but no GE reflux.

Normal stomach and duodenum.

## 2017-07-27 NOTE — Telephone Encounter (Signed)
This encounter was created in error - please disregard.

## 2017-07-30 ENCOUNTER — Encounter: Payer: 59 | Attending: General Surgery | Admitting: Skilled Nursing Facility1

## 2017-07-30 DIAGNOSIS — Z713 Dietary counseling and surveillance: Secondary | ICD-10-CM | POA: Diagnosis not present

## 2017-07-30 DIAGNOSIS — E669 Obesity, unspecified: Secondary | ICD-10-CM

## 2017-07-30 NOTE — Progress Notes (Signed)
Need orders for 10-29 surgery in epic

## 2017-07-31 ENCOUNTER — Encounter: Payer: Self-pay | Admitting: Skilled Nursing Facility1

## 2017-07-31 NOTE — Progress Notes (Signed)
Pre-Operative Nutrition Class:  Appt start time: 3976   End time:  1830.  Patient was seen on 07/30/2017 for Pre-Operative Bariatric Surgery Education [facility].   Surgery date: 08/13/2017 Surgery type: Sleeve Gastrectomy Start weight at [facility] Weight today: 386  Samples given per MNT protocol. Patient educated on appropriate usage: Bariatric Advantage Multivitamin Lot # [id number] Exp: 07/19  Bariatric Advantage Calcium  Lot # [id number] Exp: nov-11-2016  Renee Pain Protein  Shake Lot # [id number] Exp: Mar 12, 2018  The following the learning objectives were met by the patient during this course:  Identify Pre-Op Dietary Goals and will begin 2 weeks pre-operatively  Identify appropriate sources of fluids and proteins   State protein recommendations and appropriate sources pre and post-operatively  Identify Post-Operative Dietary Goals and will follow for 2 weeks post-operatively  Identify appropriate multivitamin and calcium sources  Describe the need for physical activity post-operatively and will follow MD recommendations  State when to call healthcare provider regarding medication questions or post-operative complications  Handouts given during class include:  Pre-Op Bariatric Surgery Diet Handout  Protein Shake Handout  Post-Op Bariatric Surgery Nutrition Handout  BELT Program Information Flyer  Support Group Information Flyer  [facility] Bariatric Supplements Price List  Follow[facility]Up Plan: [facility] diet advancement per MD.

## 2017-08-01 DIAGNOSIS — K648 Other hemorrhoids: Secondary | ICD-10-CM | POA: Diagnosis not present

## 2017-08-01 DIAGNOSIS — I890 Lymphedema, not elsewhere classified: Secondary | ICD-10-CM | POA: Diagnosis not present

## 2017-08-01 MED FILL — ONDANSETRON ODT 4 MG TABLET: 4 | 5 days supply | Qty: 20 | Fill #0

## 2017-08-01 MED FILL — traMADol HCL 50 MG TABS: 50 | 8 days supply | Qty: 30 | Fill #0

## 2017-08-01 MED FILL — PANTOPRAZOLE SOD DR 40 MG T: 40 | 30 days supply | Qty: 30 | Fill #0

## 2017-08-02 ENCOUNTER — Ambulatory Visit: Payer: Self-pay | Admitting: General Surgery

## 2017-08-02 MED FILL — BUMETANIDE 1 MG TABLET: 1 | 30 days supply | Qty: 30 | Fill #1

## 2017-08-02 MED FILL — FOLIC ACID 1 MG TABLET: 1 | 90 days supply | Qty: 180 | Fill #1

## 2017-08-02 MED FILL — ETODOLAC 400 MG TABLET: 400 | 30 days supply | Qty: 60 | Fill #2

## 2017-08-02 MED FILL — PREMARIN 1.25 MG TABLET: 1.25 | 30 days supply | Qty: 30 | Fill #2

## 2017-08-02 NOTE — Progress Notes (Signed)
Please place orders in EPIC as patient has a pre-op appointment on 08/07/2017! Thank you!

## 2017-08-06 DIAGNOSIS — M0589 Other rheumatoid arthritis with rheumatoid factor of multiple sites: Secondary | ICD-10-CM | POA: Diagnosis not present

## 2017-08-06 DIAGNOSIS — Z6841 Body Mass Index (BMI) 40.0 and over, adult: Secondary | ICD-10-CM | POA: Diagnosis not present

## 2017-08-06 DIAGNOSIS — Z79899 Other long term (current) drug therapy: Secondary | ICD-10-CM | POA: Diagnosis not present

## 2017-08-06 DIAGNOSIS — M150 Primary generalized (osteo)arthritis: Secondary | ICD-10-CM | POA: Diagnosis not present

## 2017-08-06 NOTE — Patient Instructions (Addendum)
JUDIT AWAD  08/06/2017   Your procedure is scheduled on: 08/13/2017    Report to [facility] Main  Entrance Take [place]  elevators to 3rd floor to  [place] at   0900 AM.  Follow signs   Call this number if you have problems the morning of surgery [phone]    Remember: ONLY 1 PERSON MAY GO WITH YOU TO SHORT STAY TO GET  READY MORNING OF YOUR SURGERY.  Do not eat food or drink liquids :After Midnight.     Take these medicines the morning of surgery with A SIP OF WATER: Zyrtec                                 You may not have any metal on your body including hair pins and              piercings  Do not wear jewelry, make-up, lotions, powders or perfumes, deodorant             Do not wear nail polish.  Do not shave  48 hours prior to surgery.               Do not bring valuables to the hospital. [place].  Contacts, dentures or bridgework may not be worn into surgery.  Leave suitcase in the car. After surgery it may be brought to your room.                     Please read over the following fact sheets you were given: _____________________________________________________________________             [facility] - Preparing for Surgery Before surgery, you can play an important role.  Because skin is not sterile, your skin needs to be as free of germs as possible.  You can reduce the number of germs on your skin by washing with CHG (chlorahexidine gluconate) soap before surgery.  CHG is an antiseptic cleaner which kills germs and bonds with the skin to continue killing germs even after washing. Please DO NOT use if you have an allergy to CHG or antibacterial soaps.  If your skin becomes reddened/irritated stop using the CHG and inform your nurse when you arrive at Short Stay. Do not shave (including legs and underarms) for at least 48 hours prior to the first CHG shower.  You may shave your  face/neck. Please follow these instructions carefully:  1.  Shower with CHG Soap the night before surgery and the  morning of Surgery.  2.  If you choose to wash your hair, wash your hair first as usual with your  normal  shampoo.  3.  After you shampoo, rinse your hair and body thoroughly to remove the  shampoo.                           4.  Use CHG as you would any other liquid soap.  You can apply chg directly  to the skin and wash                       Gently with a scrungie or clean washcloth.  5.  Apply the CHG Soap to your body ONLY FROM THE NECK DOWN.   Do not use on face/ open                           Wound or open sores. Avoid contact with eyes, ears mouth and genitals (private parts).                       Wash face,  Genitals (private parts) with your normal soap.             6.  Wash thoroughly, paying special attention to the area where your surgery  will be performed.  7.  Thoroughly rinse your body with warm water from the neck down.  8.  DO NOT shower/wash with your normal soap after using and rinsing off  the CHG Soap.                9.  Pat yourself dry with a clean towel.            10.  Wear clean pajamas.            11.  Place clean sheets on your bed the night of your first shower and do not  sleep with pets. Day of Surgery : Do not apply any lotions/deodorants the morning of surgery.  Please wear clean clothes to the hospital/surgery center.  FAILURE TO FOLLOW THESE INSTRUCTIONS MAY RESULT IN THE CANCELLATION OF YOUR SURGERY PATIENT SIGNATURE_________________________________  NURSE SIGNATURE__________________________________  ________________________________________________________________________   Adam Phenix  An incentive spirometer is a tool that can help keep your lungs clear and active. This tool measures how well you are filling your lungs with each breath. Taking long deep breaths may help reverse or decrease the chance of developing breathing  (pulmonary) problems (especially infection) following:  A long period of time when you are unable to move or be active. BEFORE THE PROCEDURE   If the spirometer includes an indicator to show your best effort, your nurse or respiratory therapist will set it to a desired goal.  If possible, sit up straight or lean slightly forward. Try not to slouch.  Hold the incentive spirometer in an upright position. INSTRUCTIONS FOR USE  1. Sit on the edge of your bed if possible, or sit up as far as you can in bed or on a chair. 2. Hold the incentive spirometer in an upright position. 3. Breathe out normally. 4. Place the mouthpiece in your mouth and seal your lips tightly around it. 5. Breathe in slowly and as deeply as possible, raising the piston or the ball toward the top of the column. 6. Hold your breath for 3-5 seconds or for as long as possible. Allow the piston or ball to fall to the bottom of the column. 7. Remove the mouthpiece from your mouth and breathe out normally. 8. Rest for a few seconds and repeat Steps 1 through 7 at least 10 times every 1-2 hours when you are awake. Take your time and take a few normal breaths between deep breaths. 9. The spirometer may include an indicator to show your best effort. Use the indicator as a goal to work toward during each repetition. 10. After each set of 10 deep breaths, practice coughing to be sure your lungs are clear. If you have an incision (the cut made at the time of surgery), support your incision when coughing by placing a  pillow or rolled up towels firmly against it. Once you are able to get out of bed, walk around indoors and cough well. You may stop using the incentive spirometer when instructed by your caregiver.  RISKS AND COMPLICATIONS  Take your time so you do not get dizzy or light-headed.  If you are in pain, you may need to take or ask for pain medication before doing incentive spirometry. It is harder to take a deep breath if you  are having pain. AFTER USE  Rest and breathe slowly and easily.  It can be helpful to keep track of a log of your progress. Your caregiver can provide you with a simple table to help with this. If you are using the spirometer at home, follow these instructions: [place] IF:   You are having difficultly using the spirometer.  You have trouble using the spirometer as often as instructed.  Your pain medication is not giving enough relief while using the spirometer.  You develop fever of 100.5 F (38.1 C) or higher. SEEK IMMEDIATE MEDICAL CARE IF:   You cough up bloody sputum that had not been present before.  You develop fever of 102 F (38.9 C) or greater.  You develop worsening pain at or near the incision site. MAKE SURE YOU:   Understand these instructions.  Will watch your condition.  Will get help right away if you are not doing well or get worse. Document Released: 02/12/2007 Document Revised: 12/25/2011 Document Reviewed: 04/15/2007 [facility] Patient Information 2014 [place], [place].   ________________________________________________________________________

## 2017-08-07 ENCOUNTER — Encounter (HOSPITAL_COMMUNITY): Payer: Self-pay

## 2017-08-07 ENCOUNTER — Encounter (HOSPITAL_COMMUNITY)
Admission: RE | Admit: 2017-08-07 | Discharge: 2017-08-07 | Disposition: A | Discharge status: Home / Self Care | Payer: 59 | Source: Ambulatory Visit | Attending: General Surgery | Admitting: General Surgery

## 2017-08-07 DIAGNOSIS — Z01812 Encounter for preprocedural laboratory examination: Secondary | ICD-10-CM | POA: Insufficient documentation

## 2017-08-07 DIAGNOSIS — E669 Obesity, unspecified: Secondary | ICD-10-CM | POA: Diagnosis not present

## 2017-08-07 HISTORY — DX: Pneumonia, unspecified organism: J18.9

## 2017-08-07 LAB — COMPREHENSIVE METABOLIC PANEL
ALT: 91 U/L — ABNORMAL HIGH (ref 14–54)
ANION GAP: 9 (ref 5–15)
AST: 77 U/L — ABNORMAL HIGH (ref 15–41)
Albumin: 4 g/dL (ref 3.5–5.0)
Alkaline Phosphatase: 57 U/L (ref 38–126)
BILIRUBIN TOTAL: 1 mg/dL (ref 0.3–1.2)
BUN: 19 mg/dL (ref 6–20)
CHLORIDE: 101 mmol/L (ref 101–111)
CO2: 28 mmol/L (ref 22–32)
Calcium: 9.5 mg/dL (ref 8.9–10.3)
Creatinine, Ser: 0.79 mg/dL (ref 0.44–1.00)
Glucose, Bld: 91 mg/dL (ref 65–99)
POTASSIUM: 4.7 mmol/L (ref 3.5–5.1)
Sodium: 138 mmol/L (ref 135–145)
TOTAL PROTEIN: 7.7 g/dL (ref 6.5–8.1)

## 2017-08-07 LAB — CBC WITH DIFFERENTIAL/PLATELET
BASOS ABS: 0 10*3/uL (ref 0.0–0.1)
Basophils Relative: 0 %
Eosinophils Absolute: 0.3 10*3/uL (ref 0.0–0.7)
Eosinophils Relative: 4 %
HEMATOCRIT: 40.6 % (ref 36.0–46.0)
Hemoglobin: 13.5 g/dL (ref 12.0–15.0)
LYMPHS PCT: 22 %
Lymphs Abs: 1.5 10*3/uL (ref 0.7–4.0)
MCH: 30.5 pg (ref 26.0–34.0)
MCHC: 33.3 g/dL (ref 30.0–36.0)
MCV: 91.6 fL (ref 78.0–100.0)
MONO ABS: 0.6 10*3/uL (ref 0.1–1.0)
Monocytes Relative: 10 %
NEUTROS ABS: 4.3 10*3/uL (ref 1.7–7.7)
Neutrophils Relative %: 64 %
PLATELETS: 228 10*3/uL (ref 150–400)
RBC: 4.43 MIL/uL (ref 3.87–5.11)
RDW: 14.8 % (ref 11.5–15.5)
WBC: 6.7 10*3/uL (ref 4.0–10.5)

## 2017-08-07 NOTE — Progress Notes (Signed)
05/25/17-LOV-cardiology-epic 04/23/17-ekg-epic 06/01/17-echo-epic  CXR-04/23/17-epic

## 2017-08-09 ENCOUNTER — Other Ambulatory Visit: Payer: Self-pay | Admitting: *Deleted

## 2017-08-09 NOTE — Patient Outreach (Signed)
[facility]) Care Management  08/09/2017  MELISSSA DONNER 1966/07/24 [id number]   Subjective: Telephone call to patient's home number, no answer, left HIPAA compliant voicemail message, and requested call back.    Objective: Per KPN (Knowledge Performance Now, point of care tool) and chart review, patient to be admitted on 08/13/17 for [place], UPPER ENDO, [place] at [facility].     Assessment: Received referral for [facility] Preoperative  / Transition of care follow up referral on 07/27/17.     Preoperative call follow up pending patient contact.     Plan: RNCM will call patient for 2nd telephone outreach attempt, preoperative call follow up, within 10 business days if no return call.    Shuayb Schepers H. Annia Friendly, BSN, [place] Management [facility] Telephonic CM Phone: [phone] Fax: [phone]

## 2017-08-10 ENCOUNTER — Other Ambulatory Visit: Payer: Self-pay | Admitting: *Deleted

## 2017-08-10 NOTE — Patient Outreach (Signed)
[facility]) Care Management  08/10/2017  Sarah Dean January 23, 1966 [id number]   Subjective: Telephone call to patient's home number, no answer, left HIPAA compliant voicemail message, and requested call back.    Objective: Per KPN (Knowledge Performance Now, point of care tool) and chart review, patient to be admitted on 08/13/17 for [place], UPPER ENDO, [place] at [facility].     Assessment: Received referral for [facility] Preoperative  / Transition of care follow up referral on 07/27/17.     Preoperative call follow up pending patient contact.     Plan: RNCM will call patient for 3rd telephone outreach attempt, preoperative call follow up, within 10 business days if no return call.    Christinia Lambeth H. Annia Friendly, BSN, [place] Management [facility] Telephonic CM Phone: [phone] Fax: [phone]

## 2017-08-13 ENCOUNTER — Encounter (HOSPITAL_COMMUNITY): Payer: Self-pay | Admitting: *Deleted

## 2017-08-13 ENCOUNTER — Inpatient Hospital Stay (HOSPITAL_COMMUNITY)
Admission: RE | Admit: 2017-08-13 | Discharge: 2017-08-14 | DRG: 621 | Disposition: A | Discharge status: Home / Self Care | Payer: 59 | Source: Ambulatory Visit | Attending: General Surgery | Admitting: General Surgery

## 2017-08-13 ENCOUNTER — Encounter (HOSPITAL_COMMUNITY)
Admission: RE | Disposition: A | Discharge status: Home / Self Care | Payer: Self-pay | Source: Ambulatory Visit | Attending: General Surgery

## 2017-08-13 ENCOUNTER — Inpatient Hospital Stay (HOSPITAL_COMMUNITY): Payer: 59 | Admitting: Certified Registered Nurse Anesthetist

## 2017-08-13 DIAGNOSIS — M1990 Unspecified osteoarthritis, unspecified site: Secondary | ICD-10-CM | POA: Diagnosis present

## 2017-08-13 DIAGNOSIS — M069 Rheumatoid arthritis, unspecified: Secondary | ICD-10-CM | POA: Diagnosis present

## 2017-08-13 DIAGNOSIS — Z90710 Acquired absence of both cervix and uterus: Secondary | ICD-10-CM

## 2017-08-13 DIAGNOSIS — Z6841 Body Mass Index (BMI) 40.0 and over, adult: Secondary | ICD-10-CM

## 2017-08-13 DIAGNOSIS — Z79899 Other long term (current) drug therapy: Secondary | ICD-10-CM | POA: Diagnosis not present

## 2017-08-13 DIAGNOSIS — Z87891 Personal history of nicotine dependence: Secondary | ICD-10-CM

## 2017-08-13 DIAGNOSIS — R600 Localized edema: Secondary | ICD-10-CM | POA: Diagnosis not present

## 2017-08-13 DIAGNOSIS — K449 Diaphragmatic hernia without obstruction or gangrene: Secondary | ICD-10-CM | POA: Diagnosis present

## 2017-08-13 DIAGNOSIS — R2243 Localized swelling, mass and lump, lower limb, bilateral: Secondary | ICD-10-CM | POA: Diagnosis not present

## 2017-08-13 DIAGNOSIS — R0602 Shortness of breath: Secondary | ICD-10-CM | POA: Diagnosis not present

## 2017-08-13 HISTORY — PX: LAPAROSCOPIC GASTRIC SLEEVE RESECTION: SHX5895

## 2017-08-13 LAB — CBC
HCT: 37.9 % (ref 36.0–46.0)
Hemoglobin: 12.8 g/dL (ref 12.0–15.0)
MCH: 30.7 pg (ref 26.0–34.0)
MCHC: 33.8 g/dL (ref 30.0–36.0)
MCV: 90.9 fL (ref 78.0–100.0)
PLATELETS: 182 10*3/uL (ref 150–400)
RBC: 4.17 MIL/uL (ref 3.87–5.11)
RDW: 14.9 % (ref 11.5–15.5)
WBC: 8.7 10*3/uL (ref 4.0–10.5)

## 2017-08-13 LAB — CREATININE, SERUM
CREATININE: 0.72 mg/dL (ref 0.44–1.00)
GFR calc Af Amer: >60 mL/min (ref >60)

## 2017-08-13 LAB — HEMOGLOBIN AND HEMATOCRIT, BLOOD
HCT: 36.5 % (ref 36.0–46.0)
HEMOGLOBIN: 12.4 g/dL (ref 12.0–15.0)

## 2017-08-13 SURGERY — GASTRECTOMY, SLEEVE, LAPAROSCOPIC
Anesthesia: General | Site: Abdomen

## 2017-08-13 MED ORDER — BUPIVACAINE-EPINEPHRINE (PF) 0.25% -1:200000 IJ SOLN
INTRAMUSCULAR | Status: AC
  Filled 2017-08-13: qty 30

## 2017-08-13 MED ORDER — LIDOCAINE 2% (20 MG/ML) 5 ML SYRINGE
INTRAMUSCULAR | Status: DC | PRN
  Administered 2017-08-13: 1.5 mg/kg/h via INTRAVENOUS

## 2017-08-13 MED ORDER — PREMIER PROTEIN SHAKE
2.0000 [oz_av] | ORAL | Status: DC
  Administered 2017-08-14 (×3): 2 [oz_av] via ORAL

## 2017-08-13 MED ORDER — ROCURONIUM BROMIDE 50 MG/5ML IV SOSY
PREFILLED_SYRINGE | INTRAVENOUS | Status: AC
  Filled 2017-08-13: qty 5

## 2017-08-13 MED ORDER — SCOPOLAMINE 1 MG/3DAYS TD PT72
1.0000 | MEDICATED_PATCH | TRANSDERMAL | Status: DC
  Administered 2017-08-13: 1.5 mg via TRANSDERMAL
  Filled 2017-08-13: qty 1

## 2017-08-13 MED ORDER — GABAPENTIN 300 MG PO CAPS
300.0000 mg | ORAL_CAPSULE | ORAL | Status: AC
Start: 2017-08-13 — End: 2017-08-13
  Administered 2017-08-13: 300 mg via ORAL
  Filled 2017-08-13: qty 1

## 2017-08-13 MED ORDER — SUGAMMADEX SODIUM 500 MG/5ML IV SOLN
INTRAVENOUS | Status: DC | PRN
  Administered 2017-08-13: 350 mg via INTRAVENOUS

## 2017-08-13 MED ORDER — PANTOPRAZOLE SODIUM 40 MG IV SOLR
40.0000 mg | Freq: Every day | INTRAVENOUS | Status: DC
  Administered 2017-08-13: 40 mg via INTRAVENOUS
  Filled 2017-08-13: qty 40

## 2017-08-13 MED ORDER — FENTANYL CITRATE (PF) 250 MCG/5ML IJ SOLN
INTRAMUSCULAR | Status: AC
  Filled 2017-08-13: qty 5

## 2017-08-13 MED ORDER — 0.9 % SODIUM CHLORIDE (POUR BTL) OPTIME
TOPICAL | Status: DC | PRN
  Administered 2017-08-13: 1000 mL

## 2017-08-13 MED ORDER — KETAMINE HCL 10 MG/ML IJ SOLN
INTRAMUSCULAR | Status: DC | PRN
  Administered 2017-08-13: 30 mg via INTRAVENOUS

## 2017-08-13 MED ORDER — PROPOFOL 10 MG/ML IV BOLUS
INTRAVENOUS | Status: AC
  Filled 2017-08-13: qty 20

## 2017-08-13 MED ORDER — SODIUM CHLORIDE 0.9 % IV SOLN
INTRAVENOUS | Status: DC
  Administered 2017-08-13: 15:00:00 via INTRAVENOUS
  Administered 2017-08-14: 125 mL/h via INTRAVENOUS
  Administered 2017-08-14: via INTRAVENOUS

## 2017-08-13 MED ORDER — MORPHINE SULFATE (PF) 2 MG/ML IV SOLN
1.0000 mg | INTRAVENOUS | Status: DC | PRN
  Administered 2017-08-14: 2 mg via INTRAVENOUS
  Filled 2017-08-13: qty 1

## 2017-08-13 MED ORDER — TRAMADOL HCL 50 MG PO TABS: 50.0000 mg | ORAL_TABLET | Freq: Four times a day (QID) | ORAL | Status: DC | PRN

## 2017-08-13 MED ORDER — ONDANSETRON HCL 4 MG/2ML IJ SOLN: 4.0000 mg | INTRAMUSCULAR | Status: DC | PRN

## 2017-08-13 MED ORDER — BUPIVACAINE LIPOSOME 1.3 % IJ SUSP
20.0000 mL | Freq: Once | INTRAMUSCULAR | Status: AC
  Administered 2017-08-13: 20 mL
  Filled 2017-08-13: qty 20

## 2017-08-13 MED ORDER — CEFOTETAN DISODIUM-DEXTROSE 2-2.08 GM-%(50ML) IV SOLR
2.0000 g | INTRAVENOUS | Status: AC
Start: 2017-08-13 — End: 2017-08-13
  Administered 2017-08-13: 2 g via INTRAVENOUS
  Filled 2017-08-13: qty 50

## 2017-08-13 MED ORDER — MIDAZOLAM HCL 2 MG/2ML IJ SOLN
INTRAMUSCULAR | Status: AC
  Filled 2017-08-13: qty 2

## 2017-08-13 MED ORDER — MIDAZOLAM HCL 5 MG/5ML IJ SOLN
INTRAMUSCULAR | Status: DC | PRN
  Administered 2017-08-13: 2 mg via INTRAVENOUS

## 2017-08-13 MED ORDER — LIDOCAINE 2% (20 MG/ML) 5 ML SYRINGE
INTRAMUSCULAR | Status: DC | PRN
  Administered 2017-08-13: 100 mg via INTRAVENOUS

## 2017-08-13 MED ORDER — LACTATED RINGERS IV SOLN
INTRAVENOUS | Status: DC
  Administered 2017-08-13 (×2): via INTRAVENOUS

## 2017-08-13 MED ORDER — ACETAMINOPHEN 160 MG/5ML PO SOLN
650.0000 mg | ORAL | Status: DC | PRN
Start: 2017-08-13 — End: 2017-08-14

## 2017-08-13 MED ORDER — CHLORHEXIDINE GLUCONATE 4 % EX LIQD: 60.0000 mL | Freq: Once | CUTANEOUS | Status: DC

## 2017-08-13 MED ORDER — FENTANYL CITRATE (PF) 100 MCG/2ML IJ SOLN
25.0000 ug | INTRAMUSCULAR | Status: DC | PRN
  Administered 2017-08-13 (×2): 25 ug via INTRAVENOUS
  Administered 2017-08-13: 50 ug via INTRAVENOUS

## 2017-08-13 MED ORDER — APREPITANT 40 MG PO CAPS
40.0000 mg | ORAL_CAPSULE | ORAL | Status: AC
Start: 2017-08-13 — End: 2017-08-13
  Administered 2017-08-13: 40 mg via ORAL
  Filled 2017-08-13: qty 1

## 2017-08-13 MED ORDER — ONDANSETRON HCL 4 MG/2ML IJ SOLN
INTRAMUSCULAR | Status: AC
  Filled 2017-08-13: qty 2

## 2017-08-13 MED ORDER — DEXAMETHASONE SODIUM PHOSPHATE 4 MG/ML IJ SOLN
4.0000 mg | INTRAMUSCULAR | Status: AC
  Administered 2017-08-13: 4 mg via INTRAVENOUS

## 2017-08-13 MED ORDER — KETAMINE HCL-SODIUM CHLORIDE 100-0.9 MG/10ML-% IV SOSY
PREFILLED_SYRINGE | INTRAVENOUS | Status: AC
  Filled 2017-08-13: qty 10

## 2017-08-13 MED ORDER — OXYCODONE HCL 5 MG/5ML PO SOLN
5.0000 mg | ORAL | Status: DC | PRN
  Administered 2017-08-14: 5 mg via ORAL
  Filled 2017-08-13 (×2): qty 5

## 2017-08-13 MED ORDER — PROPOFOL 10 MG/ML IV BOLUS
INTRAVENOUS | Status: DC | PRN
  Administered 2017-08-13: 200 mg via INTRAVENOUS

## 2017-08-13 MED ORDER — ONDANSETRON HCL 4 MG/2ML IJ SOLN
4.0000 mg | Freq: Once | INTRAMUSCULAR | Status: AC | PRN
  Administered 2017-08-13: 4 mg via INTRAVENOUS

## 2017-08-13 MED ORDER — LACTATED RINGERS IR SOLN
Status: DC | PRN
  Administered 2017-08-13: 1000 mL

## 2017-08-13 MED ORDER — ACETAMINOPHEN 500 MG PO TABS
1000.0000 mg | ORAL_TABLET | ORAL | Status: AC
  Administered 2017-08-13: 1000 mg via ORAL
  Filled 2017-08-13: qty 2

## 2017-08-13 MED ORDER — BUPIVACAINE-EPINEPHRINE (PF) 0.25% -1:200000 IJ SOLN
INTRAMUSCULAR | Status: DC | PRN
  Administered 2017-08-13: 30 mL

## 2017-08-13 MED ORDER — ENOXAPARIN SODIUM 30 MG/0.3ML ~~LOC~~ SOLN
30.0000 mg | Freq: Two times a day (BID) | SUBCUTANEOUS | Status: DC
  Administered 2017-08-14: 30 mg via SUBCUTANEOUS
  Filled 2017-08-13: qty 0.3

## 2017-08-13 MED ORDER — HEPARIN SODIUM (PORCINE) 5000 UNIT/ML IJ SOLN
5000.0000 [IU] | INTRAMUSCULAR | Status: AC
  Administered 2017-08-13: 5000 [IU] via SUBCUTANEOUS
  Filled 2017-08-13: qty 1

## 2017-08-13 MED ORDER — FENTANYL CITRATE (PF) 100 MCG/2ML IJ SOLN
INTRAMUSCULAR | Status: DC | PRN
  Administered 2017-08-13 (×3): 50 ug via INTRAVENOUS
  Administered 2017-08-13: 100 ug via INTRAVENOUS

## 2017-08-13 MED ORDER — LIDOCAINE 2% (20 MG/ML) 5 ML SYRINGE
INTRAMUSCULAR | Status: AC
  Filled 2017-08-13: qty 5

## 2017-08-13 MED ORDER — SIMETHICONE 80 MG PO CHEW
80.0000 mg | CHEWABLE_TABLET | Freq: Four times a day (QID) | ORAL | Status: DC | PRN
  Administered 2017-08-14: 80 mg via ORAL
  Filled 2017-08-13: qty 1

## 2017-08-13 MED ORDER — SUCCINYLCHOLINE CHLORIDE 200 MG/10ML IV SOSY
PREFILLED_SYRINGE | INTRAVENOUS | Status: AC
  Filled 2017-08-13: qty 10

## 2017-08-13 MED ORDER — LIDOCAINE 2% (20 MG/ML) 5 ML SYRINGE
INTRAMUSCULAR | Status: AC
  Filled 2017-08-13: qty 10

## 2017-08-13 MED ORDER — FENTANYL CITRATE (PF) 100 MCG/2ML IJ SOLN
INTRAMUSCULAR | Status: AC
  Filled 2017-08-13: qty 2

## 2017-08-13 MED ORDER — PHENYLEPHRINE 40 MCG/ML (10ML) SYRINGE FOR IV PUSH (FOR BLOOD PRESSURE SUPPORT)
PREFILLED_SYRINGE | INTRAVENOUS | Status: AC
  Filled 2017-08-13: qty 10

## 2017-08-13 MED ORDER — ROCURONIUM BROMIDE 50 MG/5ML IV SOSY
PREFILLED_SYRINGE | INTRAVENOUS | Status: DC | PRN
  Administered 2017-08-13: 50 mg via INTRAVENOUS
  Administered 2017-08-13: 10 mg via INTRAVENOUS

## 2017-08-13 MED ORDER — PHENYLEPHRINE 40 MCG/ML (10ML) SYRINGE FOR IV PUSH (FOR BLOOD PRESSURE SUPPORT)
PREFILLED_SYRINGE | INTRAVENOUS | Status: DC | PRN
  Administered 2017-08-13: 80 ug via INTRAVENOUS

## 2017-08-13 MED ORDER — HYDRALAZINE HCL 20 MG/ML IJ SOLN: 10.0000 mg | INTRAMUSCULAR | Status: DC | PRN

## 2017-08-13 MED ORDER — ONDANSETRON HCL 4 MG/2ML IJ SOLN
INTRAMUSCULAR | Status: DC | PRN
  Administered 2017-08-13: 4 mg via INTRAVENOUS

## 2017-08-13 MED ORDER — SUCCINYLCHOLINE CHLORIDE 200 MG/10ML IV SOSY
PREFILLED_SYRINGE | INTRAVENOUS | Status: DC | PRN
  Administered 2017-08-13: 140 mg via INTRAVENOUS

## 2017-08-13 SURGICAL SUPPLY — 57 items
APPLIER CLIP 5 13 M/L LIGAMAX5 (MISCELLANEOUS)
APPLIER CLIP ROT 13.4 12 LRG (CLIP)
BAG LAPAROSCOPIC 12 15 PORT 16 (BASKET) ×1 IMPLANT
BAG RETRIEVAL 12/15 (BASKET) ×2
BAG RETRIEVAL 12/15MM (BASKET) ×1
BANDAGE ADH SHEER 1  50/CT (GAUZE/BANDAGES/DRESSINGS) ×3 IMPLANT
BENZOIN TINCTURE PRP APPL 2/3 (GAUZE/BANDAGES/DRESSINGS) ×3 IMPLANT
BLADE SURG SZ11 CARB STEEL (BLADE) ×3 IMPLANT
CABLE HIGH FREQUENCY MONO STRZ (ELECTRODE) ×3 IMPLANT
CHLORAPREP W/TINT 26ML (MISCELLANEOUS) ×3 IMPLANT
CLIP APPLIE 5 13 M/L LIGAMAX5 (MISCELLANEOUS) IMPLANT
CLIP APPLIE ROT 13.4 12 LRG (CLIP) IMPLANT
CLOSURE WOUND 1/2 X4 (GAUZE/BANDAGES/DRESSINGS) ×1
COVER SURGICAL LIGHT HANDLE (MISCELLANEOUS) ×3 IMPLANT
DRAIN CHANNEL 19F RND (DRAIN) IMPLANT
DRAPE UNIVERSAL PACK (DRAPES) ×3 IMPLANT
ELECT REM PT RETURN 15FT ADLT (MISCELLANEOUS) ×3 IMPLANT
EVACUATOR SILICONE 100CC (DRAIN) IMPLANT
GAUZE SPONGE 4X4 12PLY STRL (GAUZE/BANDAGES/DRESSINGS) IMPLANT
GLOVE BIOGEL PI IND STRL 7.0 (GLOVE) ×1 IMPLANT
GLOVE BIOGEL PI INDICATOR 7.0 (GLOVE) ×2
GLOVE SURG SS PI 7.0 STRL IVOR (GLOVE) ×3 IMPLANT
GOWN STRL REUS W/TWL LRG LVL3 (GOWN DISPOSABLE) ×3 IMPLANT
GOWN STRL REUS W/TWL XL LVL3 (GOWN DISPOSABLE) ×9 IMPLANT
GRASPER SUT TROCAR 14GX15 (MISCELLANEOUS) ×3 IMPLANT
HANDLE STAPLE EGIA 4 XL (STAPLE) ×3 IMPLANT
HOVERMATT SINGLE USE (MISCELLANEOUS) ×3 IMPLANT
KIT BASIN OR (CUSTOM PROCEDURE TRAY) ×3 IMPLANT
MARKER SKIN DUAL TIP RULER LAB (MISCELLANEOUS) ×3 IMPLANT
NEEDLE SPNL 22GX3.5 QUINCKE BK (NEEDLE) ×3 IMPLANT
RELOAD EGIA 45 MED/THCK PURPLE (STAPLE) IMPLANT
RELOAD EGIA 60 MED/THCK PURPLE (STAPLE) ×9 IMPLANT
RELOAD EGIA BLACK ROTIC 45MM (STAPLE) IMPLANT
RELOAD TRI 2.0 60 XTHK VAS SUL (STAPLE) ×6 IMPLANT
SCISSORS LAP 5X45 EPIX DISP (ENDOMECHANICALS) IMPLANT
SET IRRIG TUBING LAPAROSCOPIC (IRRIGATION / IRRIGATOR) ×3 IMPLANT
SHEARS HARMONIC ACE PLUS 45CM (MISCELLANEOUS) ×3 IMPLANT
SLEEVE GASTRECTOMY 40FR VISIGI (MISCELLANEOUS) ×3 IMPLANT
SLEEVE XCEL OPT CAN 5 100 (ENDOMECHANICALS) ×6 IMPLANT
SOLUTION ANTI FOG 6CC (MISCELLANEOUS) ×3 IMPLANT
SPONGE LAP 18X18 X RAY DECT (DISPOSABLE) ×3 IMPLANT
STRIP CLOSURE SKIN 1/2X4 (GAUZE/BANDAGES/DRESSINGS) ×2 IMPLANT
SUT ETHIBOND 0 36 GRN (SUTURE) IMPLANT
SUT ETHILON 2 0 PS N (SUTURE) ×3 IMPLANT
SUT MNCRL AB 4-0 PS2 18 (SUTURE) ×3 IMPLANT
SUT VICRYL 0 TIES 12 18 (SUTURE) ×3 IMPLANT
SYR 20CC LL (SYRINGE) ×3 IMPLANT
SYR 50ML LL SCALE MARK (SYRINGE) ×3 IMPLANT
TOWEL OR 17X26 10 PK STRL BLUE (TOWEL DISPOSABLE) ×3 IMPLANT
TOWEL OR NON WOVEN STRL DISP B (DISPOSABLE) ×3 IMPLANT
TROCAR BLADELESS 15MM (ENDOMECHANICALS) ×3 IMPLANT
TROCAR BLADELESS OPT 5 100 (ENDOMECHANICALS) ×3 IMPLANT
TUBE CALIBRATION LAPBAND (TUBING) ×3 IMPLANT
TUBING CONNECTING 10 (TUBING) ×4 IMPLANT
TUBING CONNECTING 10' (TUBING) ×2
TUBING ENDO SMARTCAP PENTAX (MISCELLANEOUS) ×3 IMPLANT
TUBING INSUF HEATED (TUBING) ×3 IMPLANT

## 2017-08-13 NOTE — Discharge Instructions (Signed)
° ° ° °GASTRIC BYPASS/SLEEVE ° Home Care Instructions ° ° These instructions are to help you care for yourself when you go home. ° °Call: If you have any problems. °• Call [phone] and ask for the surgeon on call °• If you need immediate assistance come to the ER at [place]. Tell the ER staff you are a new post-op gastric bypass or gastric sleeve patient  °Signs and symptoms to report: • Severe  vomiting or nausea °o If you cannot handle clear liquids for longer than 1 day, call your surgeon °• Abdominal pain which does not get better after taking your pain medication °• Fever greater than 100.4°  F and chills °• Heart rate over 100 beats a minute °• Trouble breathing °• Chest pain °• Redness,  swelling, drainage, or foul odor at incision (surgical) sites °• If your incisions open or pull apart °• Swelling or pain in calf (lower leg) °• Diarrhea (Loose bowel movements that happen often), frequent watery, uncontrolled bowel movements °• Constipation, (no bowel movements for 3 days) if this happens: °o Take Milk of Magnesia, 2 tablespoons by mouth, 3 times a day for 2 days if needed °o Stop taking Milk of Magnesia once you have had a bowel movement °o Call your doctor if constipation continues °Or °o Take Miralax  (instead of Milk of Magnesia) following the label instructions °o Stop taking Miralax once you have had a bowel movement °o Call your doctor if constipation continues °• Anything you think is “abnormal for you” °  °Normal side effects after surgery: • Unable to sleep at night or unable to concentrate °• Irritability °• Being tearful (crying) or depressed ° °These are common complaints, possibly related to your anesthesia, stress of surgery, and change in lifestyle, that usually go away a few weeks after surgery. If these feelings continue, call your medical doctor.  °Wound Care: You may have surgical glue, steri-strips, or staples over your incisions after surgery °• Surgical glue: Looks like clear  film over your incisions and will wear off a little at a time °• Steri-strips: Adhesive strips of tape over your incisions. You may notice a yellowish color on skin under the steri-strips. This is used to make the steri-strips stick better. Do not pull the steri-strips off - let them fall off °• Staples: Staples may be removed before you leave the hospital °o If you go home with staples, call [facility] for an appointment with your surgeon’s nurse to have staples removed 10 days after surgery, [phone] °• Showering: You may shower two (2) days after your surgery unless your surgeon tells you differently °o Wash gently around incisions with warm soapy water, rinse well, and gently pat dry °o If you have a drain (tube from your incision), you may need someone to hold this while you shower °o No tub baths until staples are removed and incisions are healed °  °Medications: • Medications should be liquid or crushed if larger than the size of a dime °• Extended release pills (medication that releases a little bit at a time through the  day) should not be crushed °• Depending on the size and number of medications you take, you may need to space (take a few throughout the day)/change the time you take your medications so that you do not over-fill your pouch (smaller stomach) °• Make sure you follow-up with you primary care physician to make medication changes needed during rapid weight loss and life -style changes °•   If you have diabetes, follow up with your doctor that orders your diabetes medication(s) within one week after surgery and check your blood sugar regularly   Do not drive while taking narcotics (pain medications)   Do not take acetaminophen (Tylenol) and Roxicet or Lortab Elixir at the same time since these pain medications contain acetaminophen   Diet:  First 2 Weeks You will see the nutritionist about two (2) weeks after your surgery. The nutritionist will increase the types of  foods you can eat if you are handling liquids well:  If you have severe vomiting or nausea and cannot handle clear liquids lasting longer than 1 day call your surgeon Protein Shake  Drink at least 2 ounces of shake 5-6 times per day  Each serving of protein shakes (usually 8-12 ounces) should have a minimum of: o 15 grams of protein o And no more than 5 grams of carbohydrate  Goal for protein each day: o Men = 80 grams per day o Women = 60 grams per day     Protein powder may be added to fluids such as non-fat milk or Lactaid milk or Soy milk (limit to 35 grams added protein powder per serving)  Hydration  Slowly increase the amount of water and other clear liquids as tolerated (See Acceptable Fluids)  Slowly increase the amount of protein shake as tolerated  Sip fluids slowly and throughout the day  May use sugar substitutes in small amounts (no more than 6-8 packets per day; i.e. Splenda)  Fluid Goal  The first goal is to drink at least 8 ounces of protein shake/drink per day (or as directed by the nutritionist); some examples of protein shakes are [employer], [employer], EAS Edge HP, and Unjury. - See handout from pre-op Bariatric Education Class: o Slowly increase the amount of protein shake you drink as tolerated o You may find it easier to slowly sip shakes throughout the day o It is important to get your proteins in first  Your fluid goal is to drink 64-100 ounces of fluid daily o It may take a few weeks to build up to this   32 oz. (or more) should be clear liquids And  32 oz. (or more) should be full liquids (see below for examples)  Liquids should not contain sugar, caffeine, or carbonation  Clear Liquids:  Water of Sugar-free flavored water (i.e. Fruit HO, Propel)  Decaffeinated coffee or tea (sugar-free)  Crystal lite, Wylers Lite, Minute Maid Lite  Sugar-free Jell-O  Bouillon or broth  Sugar-free Popsicle:    - Less than 20 calories  each; Limit 1 per day  Full Liquids:                   Protein Shakes/Drinks + 2 choices per day of other full liquids  Full liquids must be: o No More Than 12 grams of Carbs per serving o No More Than 3 grams of Fat per serving  Strained low-fat cream soup  Non-Fat milk  Fat-free Lactaid Milk  Sugar-free yogurt (Dannon Lite & Fit, Greek yogurt)    Vitamins and Minerals  Start 1 day after surgery unless otherwise directed by your surgeon  2 Chewable Bariatric Multivitamin / Multimineral Supplement with iron  (Example: 3 Chewable Calcium  Plus 600 with Vitamin D-3) o Take 500 mg three (3) times a day for a total of 1500 mg each day o Do not take all 3 doses of calcium at one time as it may  cause constipation, and you can only absorb 500 mg at a time o Do not mix multivitamins containing iron with calcium supplements;  take 2 hours apart  Menstruating women and those at risk for anemia ( a blood disease that causes weakness) may need extra iron o Talk to your doctor to see if you need more iron  If you need extra iron: Total daily Iron recommendation (including Vitamins) is 50 to 100 mg Iron/day  Do not stop taking or change any vitamins or minerals until you talk to your nutritionist or surgeon  Your nutritionist and/or surgeon must approve all vitamin and mineral supplements   Activity and Exercise: It is important to continue walking at home. Limit your physical activity as instructed by your doctor. During this time, use these guidelines:  Do not lift anything greater than ten  (10) pounds for at least two (2) weeks  Do not go back to work or drive until Engineer, production says you can  You may have sex when you feel comfortable o It is VERY important for female patients to use a reliable birth control method; fertility often increase after surgery o Do not get pregnant for at least 18 months  Start exercising as soon as your doctor tells you that you can o Make sure your  doctor approves any physical activity  Start with a simple walking program  Walk 5-15 minutes each day, 7 days per week  Slowly increase until you are walking 30-45 minutes per day  Consider joining our [place] program. [phone] or email [email or web address]   Special Instructions Things to remember:  Use your CPAP when sleeping if this applies to you  Consider buying a medical alert bracelet that says you had lap-band surgery     You will likely have your first fill (fluid added to your band) 6 - 8 weeks after surgery  [facility] has a free Bariatric Surgery Support Group that meets monthly, the 3rd Thursday, [place]. You can see classes online at [email or web address]  It is very important to keep all follow up appointments with your surgeon, nutritionist, primary care physician, and behavioral health practitioner o After the first year, please follow up with your bariatric surgeon and nutritionist at least once a year in order to maintain best weight loss results                    [facility]:  [place]: [phone]               Bariatric Nurse Coordinator: [phone]  Gastric Bypass/Sleeve Home Care Instructions  Rev. 11/2012                                                         Reviewed and Endorsed                                                    by [facility] Patient Education Committee, Jan, 2014

## 2017-08-13 NOTE — Op Note (Addendum)
Preop Diagnosis: Obesity Class III  Postop Diagnosis: same  Procedure performed: laparoscopic Sleeve Gastrectomy  Assitant: Romana Juniper  Indications:  The patient is a 51 y.o. year-old morbidly obese female who has been followed in the Bariatric Clinic as an outpatient. This patient was diagnosed with morbid obesity with a BMI of Body mass index is 60.59 kg/m. and significant co-morbidities including osteoarthritis, lower extremity edema.  The patient was counseled extensively in the Bariatric Outpatient Clinic and after a thorough explanation of the risks and benefits of surgery (including death from complications, bowel leak, infection such as peritonitis and/or sepsis, internal hernia, bleeding, need for blood transfusion, bowel obstruction, organ failure, pulmonary embolus, deep venous thrombosis, wound infection, incisional hernia, skin breakdown, and others entailed on the consent form) and after a compliant diet and exercise program, the patient was scheduled for an elective laparoscopic sleeve gastrectomy.  Description of Operation:  Following informed consent, the patient was taken to the operating room and placed on the operating table in the supine position.  She had previously received prophylactic antibiotics and subcutaneous heparin for DVT prophylaxis in the pre-op holding area.  After induction of general endotracheal anesthesia by the anesthesiologist, the patient underwent placement of sequential compression devices, Foley catheter and an oro-gastric tube.  A timeout was confirmed by the surgery and anesthesia teams.  The patient was adequately padded at all pressure points and placed on a footboard to prevent slippage from the OR table during extremes of position during surgery.  She underwent a routine sterile prep and drape of her entire abdomen.    Next, A transverse incision was made under the left subcostal area and a 25mm optical viewing trocar was introduced into the  peritoneal cavity. Pneumoperitoneum was applied with a high flow and low pressure. A laparoscope was inserted to confirm placement. A extraperitoneal block was then placed at the lateral abdominal wall using exparel diluted with marcaine. 5 additional incisions were placed: 1 4mm trocar to the left of the midline. 1 additional 7mm trocar in the left lateral area, 1 15mm trocar in the right mid abdomen, 1 56mm trocar in the right subcostal area, and a Nathanson retractor was placed through a subxiphoid incision.  The UGI showed a small hiatal hernia. Therefore, the pars flaccida was incised with harmonic scalpel. The stomach was reduced but on dissection of the posterior crus there was a visible hernia with small sac. The sac was dissected free and 1 0 ethibond sutures placed in interrupted fashion. A calibration tube was passed to ensure appropriate size of the hiatus.   The fat pad at the GE junction was incised and the gastrodiaphragmatic ligament was divided using the Harmonic scalpel. Next, a hole was created through the lesser omentum along the greater curve of the stomach to enter the lesser sac. The vessels along the greater omentum were  Then ligated and divided using the Harmonic scalpel moving towards the spleen and then short gastric vessels were ligated and divided in the same fashion to fully mobilize the fundus. The left crus was identified to ensure completion of the dissection. Next the antrum was measured and dissection continued inferiorly along the greater curve towards the pylorus and stopped 6cm from the pylorus.   A 40Fr ViSiGi dilator was placed into the esophgaus and along the lesser curve of the stomach and placed on suction. 2 non-reinforced 77mm 4-37mm tristapler(s) followed by 3 29mm 3-49mm tristaplers were used to make the resection along the antrum being sure to  stay well away from the angularis by angling the jaws of the stapler towards the greater curve and later completing the  resection staying along the [place] and ensuring the fundus was not retained by appropriately retracting it lateral. Air was inserted through the [place] to perform a leak test showing no bubbles and a neutral lie of the stomach.  The assistant then went and performed an upper endoscopy and leak test. No bubbles were seen and the sleeve and antrum distended appropriately. The specimen was then placed in an endocatch bag and removed by the 22mm port. The fascia of the 76mm port was closed with a 0 vicryl by suture passer. Hemostasis was ensured. Pneumoperitoneum was evacuated, all ports were removed and all incisions closed with 4-0 monocryl suture in subcuticular fashion. Steristrips and bandaids were put in place for dressing. The patient awoke from anesthesia and was brought to pacu in stable condition. All counts were correct.  Estimated blood loss: <11ml  Specimens:  Sleeve gastrectomy  Local Anesthesia: 50 ml Exparel:0.5% Marcaine mix  Post-Op Plan:       Pain Management: PO, prn      Antibiotics: Prophylactic      Anticoagulation: Prophylactic, Starting now      Post Op Studies/Consults: Not applicable      Intended Discharge: within 48h      Intended Outpatient Follow-Up: Two Week      Intended Outpatient Studies: Not Applicable      Other: Not Applicable   Sarah Dean Sarah Dean

## 2017-08-13 NOTE — Progress Notes (Signed)
Discussed post op day goals with patient including ambulation, IS, diet progression, pain, and nausea control.  Questions answered. 

## 2017-08-13 NOTE — Anesthesia Procedure Notes (Signed)
Procedure Name: Intubation Date/Time: 08/13/2017 10:52 AM Performed by: Montel Clock Pre-anesthesia Checklist: Patient identified, Emergency Drugs available, Suction available, Patient being monitored and Timeout performed Patient Re-evaluated:Patient Re-evaluated prior to induction Oxygen Delivery Method: Circle system utilized Preoxygenation: Pre-oxygenation with 100% oxygen Induction Type: IV induction Ventilation: Mask ventilation without difficulty and Oral airway inserted - appropriate to patient size Laryngoscope Size: Mac and 3 Grade View: Grade II Tube type: Oral Tube size: 7.5 mm Number of attempts: 1 Airway Equipment and Method: Stylet Placement Confirmation: ETT inserted through vocal cords under direct vision,  positive ETCO2 and breath sounds checked- equal and bilateral Secured at: 21 cm Tube secured with: Tape Dental Injury: Teeth and Oropharynx as per pre-operative assessment

## 2017-08-13 NOTE — H&P (Signed)
Sarah Dean is an 51 y.o. female.   Chief Complaint: obesity HPI: 51 yo female with multiple medical problems presents with obesity and is ready to proceed with sleeve gastrectomy.  Past Medical History:  Diagnosis Date  . Condyloma   . Morbid obesity ([place])   . Pneumonia    hx of   . Rheumatoid arthritis ([place]) 2005  . STD (sexually transmitted disease)    + trich  . Tubal ectopic pregnancy    times 2    Past Surgical History:  Procedure Laterality Date  . ANKLE SURGERY    . CHOLECYSTECTOMY  04/25/07  . COLONOSCOPY N/A 03/24/2015   YBO:FBPZW external hemorrhoids/moderate sized internal hemorrhoids/moderate diverticulosis in the sigmoid colon  . HEMORRHOID BANDING N/A 03/24/2015   Procedure: HEMORRHOID BANDING;  Surgeon: Danie Binder, MD;  Location: [facility];  Service: Endoscopy;  Laterality: N/A;  . TOTAL ABDOMINAL HYSTERECTOMY  04/25/07   BSO    Family History  Problem Relation Age of Onset  . Cancer Father   . Cirrhosis Father   . Colon cancer Neg Hx    Social History:  reports that she has quit smoking. She has never used smokeless tobacco. She reports that she uses drugs, including Marijuana. She reports that she does not drink alcohol.  Allergies: No Known Allergies  Medications Prior to Admission  Medication Sig Dispense Refill  . Ascorbic Acid (VITAMIN C) 1000 MG tablet Take 1,000 mg by mouth daily.    . bumetanide (BUMEX) 1 MG tablet Take 1 tablet by mouth daily as needed (fluid). occ    . cetirizine (ZYRTEC) 10 MG tablet Take 10 mg by mouth daily.    . cholecalciferol (VITAMIN D) 1000 units tablet Take 1 tablet by mouth daily.     . Coenzyme Q10 (COQ10) 200 MG CAPS Take 1 capsule by mouth daily.    Marland Kitchen CRANBERRY PO Take 1 tablet by mouth daily.     Marland Kitchen etodolac (LODINE) 400 MG tablet Take 400 mg by mouth daily.     . Flaxseed, Linseed, (FLAXSEED OIL) 1000 MG CAPS Take 1 capsule by mouth daily.     . folic acid (FOLVITE) 1 MG tablet Take 2 mg by mouth  daily.     Javier Docker Oil 500 MG CAPS Take 1 capsule by mouth daily.     . magnesium oxide (MAG-OX) 400 MG tablet Take 400 mg by mouth daily.    . methotrexate 1 G injection Inject into the vein every 7 (seven) days.    . milk thistle 175 MG tablet Take 175 mg by mouth daily.     . Misc Natural Products (GLUCOSAMINE CHOND COMPLEX/MSM PO) Take 2 tablets by mouth daily.    . Multiple Vitamins-Minerals (ALIVE ONCE DAILY WOMENS PO) Take 1 tablet by mouth daily.    Marland Kitchen PREMARIN 1.25 MG tablet TAKE 1 TABLET BY MOUTH DAILY. 30 tablet 11  . Probiotic Product (PROBIOTIC PO) Take 1 capsule by mouth daily.     Marland Kitchen HUMIRA 40 MG/0.8ML PSKT Inject 0.8 mLs (40 mg total) into the skin every 14 (fourteen) days. For 28 days 2 each 5    No results found for this or any previous visit (from the past 48 hour(s)). No results found.  Review of Systems  Constitutional: Negative for chills and fever.  HENT: Negative for hearing loss.   Eyes: Negative for blurred vision and double vision.  Respiratory: Negative for cough and hemoptysis.   Cardiovascular: Negative for chest  pain and palpitations.  Gastrointestinal: Negative for abdominal pain, nausea and vomiting.  Genitourinary: Negative for dysuria and urgency.  Musculoskeletal: Negative for myalgias and neck pain.  Skin: Negative for itching and rash.  Neurological: Negative for dizziness, tingling and headaches.  Endo/Heme/Allergies: Does not bruise/bleed easily.  Psychiatric/Behavioral: Negative for depression and suicidal ideas.    Blood pressure 128/81, pulse 91, temperature 98.1 F (36.7 C), temperature source Oral, resp. rate 18, height 5\' 5"  (1.651 m), weight (!) 165.2 kg (364 lb 2 oz), last menstrual period 04/25/2007, SpO2 98 %. Physical Exam  Vitals reviewed. Constitutional: She is oriented to person, place, and time. She appears well-developed and well-nourished.  HENT:  Head: Normocephalic and atraumatic.  Eyes: Pupils are equal, round, and  reactive to light. Conjunctivae and EOM are normal.  Neck: Normal range of motion. Neck supple.  Cardiovascular: Normal rate and regular rhythm.   Respiratory: Effort normal and breath sounds normal.  GI: Soft. Bowel sounds are normal. She exhibits no distension. There is no tenderness.  Musculoskeletal: Normal range of motion.  Neurological: She is alert and oriented to person, place, and time.  Skin: Skin is warm and dry.  Psychiatric: She has a normal mood and affect. Her behavior is normal.     Assessment/Plan 51 yo female with obesity -lap sleeve gastrectomy -ERAS protocol  Mickeal Skinner, MD 08/13/2017, 10:34 AM

## 2017-08-13 NOTE — Anesthesia Preprocedure Evaluation (Addendum)
Anesthesia Evaluation  Patient identified by MRN, date of birth, ID band Patient awake    Reviewed: Allergy & Precautions, NPO status , Patient's Chart, lab work & pertinent test results  Airway Mallampati: II  TM Distance: >3 FB Neck ROM: Full    Dental  (+) Dental Advisory Given   Pulmonary former smoker,    breath sounds clear to auscultation       Cardiovascular negative cardio ROS   Rhythm:Regular Rate:Normal     Neuro/Psych negative neurological ROS     GI/Hepatic negative GI ROS, Neg liver ROS,   Endo/Other  Morbid obesity  Renal/GU negative Renal ROS     Musculoskeletal  (+) Arthritis ,   Abdominal   Peds  Hematology negative hematology ROS (+)   Anesthesia Other Findings   Reproductive/Obstetrics                            Lab Results  Component Value Date   WBC 6.7 08/07/2017   HGB 13.5 08/07/2017   HCT 40.6 08/07/2017   MCV 91.6 08/07/2017   PLT 228 08/07/2017   Lab Results  Component Value Date   CREATININE 0.79 08/07/2017   BUN 19 08/07/2017   NA 138 08/07/2017   K 4.7 08/07/2017   CL 101 08/07/2017   CO2 28 08/07/2017    Anesthesia Physical Anesthesia Plan  ASA: III  Anesthesia Plan: General   Post-op Pain Management:    Induction: Intravenous  PONV Risk Score and Plan: 4 or greater and Ondansetron, Dexamethasone, Midazolam, Scopolamine patch - Pre-op and Treatment may vary due to age or medical condition  Airway Management Planned: Oral ETT  Additional Equipment:   Intra-op Plan:   Post-operative Plan: Extubation in OR  Informed Consent: I have reviewed the patients History and Physical, chart, labs and discussed the procedure including the risks, benefits and alternatives for the proposed anesthesia with the patient or authorized representative who has indicated his/her understanding and acceptance.   Dental advisory given  Plan Discussed  with: CRNA  Anesthesia Plan Comments:        Anesthesia Quick Evaluation

## 2017-08-13 NOTE — Transfer of Care (Signed)
Immediate Anesthesia Transfer of Care Note  Patient: Sarah Dean  Procedure(s) Performed: LAPAROSCOPIC GASTRIC SLEEVE RESECTION, UPPER ENDOSCOPY HIATAL HERNIA REPAIR (N/A Abdomen)  Patient Location: PACU  Anesthesia Type:General  Level of Consciousness: awake, drowsy and patient cooperative  Airway & Oxygen Therapy: Patient Spontanous Breathing and Patient connected to face mask oxygen  Post-op Assessment: Report given to RN and Post -op Vital signs reviewed and stable  Post vital signs: Reviewed and stable  Last Vitals:  Vitals:   08/13/17 0913  BP: 128/81  Pulse: 91  Resp: 18  Temp: 36.7 C  SpO2: 98%    Last Pain:  Vitals:   08/13/17 0913  TempSrc: Oral      Patients Stated Pain Goal: 5 (50/27/74 1287)  Complications: No apparent anesthesia complications

## 2017-08-13 NOTE — Op Note (Signed)
Preoperative diagnosis: laparoscopic sleeve gastrectomy  Postoperative diagnosis: Same   Procedure: Upper endoscopy   Surgeon: Clovis Riley, M.D.  Anesthesia: Gen.   Indications for procedure: This patient was undergoing a laparoscopic sleeve gastrectomy.   Description of procedure: The endoscopy was placed in the mouth and into the oropharynx and under endoscopic vision it was advanced to the esophagogastric junction. The pouch was insufflated and no bleeding or bubbles were seen. The GEJ was identified at 42cm from the teeth. There was no angulation or undue narrowing at the incisura.  No bleeding or leaks were detected. The scope was withdrawn without difficulty.   Clovis Riley, M.D. General, Bariatric, & Minimally Invasive Surgery [facility]

## 2017-08-13 NOTE — Anesthesia Postprocedure Evaluation (Signed)
Anesthesia Post Note  Patient: VEGAS COFFIN  Procedure(s) Performed: LAPAROSCOPIC GASTRIC SLEEVE RESECTION, UPPER ENDOSCOPY HIATAL HERNIA REPAIR (N/A Abdomen)     Patient location during evaluation: PACU Anesthesia Type: General Level of consciousness: awake and alert Pain management: pain level controlled Vital Signs Assessment: post-procedure vital signs reviewed and stable Respiratory status: spontaneous breathing, nonlabored ventilation, respiratory function stable and patient connected to nasal cannula oxygen Cardiovascular status: blood pressure returned to baseline and stable Postop Assessment: no apparent nausea or vomiting Anesthetic complications: no    Last Vitals:  Vitals:   08/13/17 1330 08/13/17 1345  BP: 112/69 110/71  Pulse: 76 79  Resp: 17 16  Temp:    SpO2: 97% 96%    Last Pain:  Vitals:   08/13/17 1345  TempSrc:   PainSc: 3                  Tiajuana Amass

## 2017-08-14 ENCOUNTER — Encounter: Payer: Self-pay | Admitting: *Deleted

## 2017-08-14 ENCOUNTER — Other Ambulatory Visit: Payer: Self-pay | Admitting: *Deleted

## 2017-08-14 LAB — COMPREHENSIVE METABOLIC PANEL
ALT: 68 U/L — AB (ref 14–54)
ANION GAP: 8 (ref 5–15)
AST: 45 U/L — AB (ref 15–41)
Albumin: 3.4 g/dL — ABNORMAL LOW (ref 3.5–5.0)
Alkaline Phosphatase: 46 U/L (ref 38–126)
BILIRUBIN TOTAL: 0.9 mg/dL (ref 0.3–1.2)
BUN: 9 mg/dL (ref 6–20)
CO2: 27 mmol/L (ref 22–32)
Calcium: 8.9 mg/dL (ref 8.9–10.3)
Chloride: 106 mmol/L (ref 101–111)
Creatinine, Ser: 0.63 mg/dL (ref 0.44–1.00)
Glucose, Bld: 99 mg/dL (ref 65–99)
POTASSIUM: 4.5 mmol/L (ref 3.5–5.1)
Sodium: 141 mmol/L (ref 135–145)
TOTAL PROTEIN: 6.6 g/dL (ref 6.5–8.1)

## 2017-08-14 LAB — CBC WITH DIFFERENTIAL/PLATELET
BASOS ABS: 0 10*3/uL (ref 0.0–0.1)
Basophils Relative: 0 %
EOS PCT: 0 %
Eosinophils Absolute: 0 10*3/uL (ref 0.0–0.7)
HEMATOCRIT: 36.8 % (ref 36.0–46.0)
Hemoglobin: 11.9 g/dL — ABNORMAL LOW (ref 12.0–15.0)
LYMPHS ABS: 0.9 10*3/uL (ref 0.7–4.0)
LYMPHS PCT: 12 %
MCH: 29.7 pg (ref 26.0–34.0)
MCHC: 32.3 g/dL (ref 30.0–36.0)
MCV: 91.8 fL (ref 78.0–100.0)
MONO ABS: 0.6 10*3/uL (ref 0.1–1.0)
Monocytes Relative: 7 %
NEUTROS ABS: 6.3 10*3/uL (ref 1.7–7.7)
Neutrophils Relative %: 81 %
PLATELETS: 217 10*3/uL (ref 150–400)
RBC: 4.01 MIL/uL (ref 3.87–5.11)
RDW: 14.7 % (ref 11.5–15.5)
WBC: 7.7 10*3/uL (ref 4.0–10.5)

## 2017-08-14 NOTE — Discharge Summary (Signed)
Physician Discharge Summary  Sarah Dean WCH:[id number] DOB: 12-04-1965 DOA: 08/13/2017  PCP: Monico Blitz, MD  Admit date: 08/13/2017 Discharge date: 08/14/2017  Recommendations for Outpatient Follow-up:  1.  (include homehealth, outpatient follow-up instructions, specific recommendations for PCP to follow-up on, etc.)  Follow-up Information    Sarah Dean, Sarah Bruce, MD. Go on 08/29/2017.   Specialty:  General Surgery Why:  at [address] Contact information: [place] [address] [phone]        Sarah Dean, Sarah Bruce, MD Follow up.   Specialty:  General Surgery Contact information: [place] Sarah Dean [id number] [phone]          Discharge Diagnoses:  Active Problems:   Morbid obesity ([place])   Surgical Procedure: Laparoscopic Sleeve Gastrectomy, upper endoscopy  Discharge Condition: Good Disposition: Home  Diet recommendation: Postoperative sleeve gastrectomy diet (liquids only)  Filed Weights   08/13/17 0913  Weight: (!) 165.2 kg (364 lb 2 oz)     Hospital Course:  The patient was admitted after undergoing laparoscopic sleeve gastrectomy. POD 0 she ambulated well. POD 1 she was started on the water diet protocol and tolerated 400 ml in the first shift. Once meeting the water amount she was advanced to bariatric protein shakes which they tolerated and were discharged home POD 1.  Treatments: surgery: laparoscopic sleeve gastrectomy  Discharge Instructions  Discharge Instructions    Ambulate hourly while awake    Complete by:  As directed    Call MD for:  difficulty breathing, headache or visual disturbances    Complete by:  As directed    Call MD for:  persistant dizziness or light-headedness    Complete by:  As directed    Call MD for:  persistant nausea and vomiting    Complete by:  As directed    Call MD for:  redness, tenderness, or signs of infection (pain, swelling, redness, odor or  green/yellow discharge around incision site)    Complete by:  As directed    Call MD for:  severe uncontrolled pain    Complete by:  As directed    Call MD for:  temperature >101 F    Complete by:  As directed    Diet bariatric full liquid    Complete by:  As directed    Discharge wound care:    Complete by:  As directed    Remove Bandaids tomorrow, ok to shower tomorrow. Steristrips may fall off in 1-3 weeks.   Incentive spirometry    Complete by:  As directed    Perform hourly while awake     Allergies as of 08/14/2017   No Known Allergies     Medication List    STOP taking these medications   CRANBERRY PO   Flaxseed Oil 1000 MG Caps   GLUCOSAMINE CHOND COMPLEX/MSM PO   Krill Oil 500 MG Caps   milk thistle 175 MG tablet   PREMARIN 1.25 MG tablet Generic drug:  estrogens (conjugated)   vitamin C 1000 MG tablet     TAKE these medications   ALIVE ONCE DAILY WOMENS PO Take 1 tablet by mouth daily.   BUMEX 1 MG tablet Generic drug:  bumetanide Take 1 tablet by mouth daily as needed (fluid). occ   cetirizine 10 MG tablet Commonly known as:  ZYRTEC Take 10 mg by mouth daily.   cholecalciferol 1000 units tablet Commonly known as:  VITAMIN D Take 1 tablet by mouth  daily.   CoQ10 200 MG Caps Take 1 capsule by mouth daily.   folic acid 1 MG tablet Commonly known as:  FOLVITE Take 2 mg by mouth daily.   HUMIRA 40 MG/0.8ML Pskt Generic drug:  Adalimumab Inject 0.8 mLs (40 mg total) into the skin every 14 (fourteen) days. For 28 days   magnesium oxide 400 MG tablet Commonly known as:  MAG-OX Take 400 mg by mouth daily.   methotrexate 1 g injection Commonly known as:  50 mg/ml Inject into the vein every 7 (seven) days.   PROBIOTIC PO Take 1 capsule by mouth daily.            Discharge Care Instructions        Start     Ordered   08/14/17 0000  Discharge wound care:    Comments:  Remove Bandaids tomorrow, ok to shower tomorrow. Steristrips may  fall off in 1-3 weeks.   08/14/17 1126     Follow-up Information    Sarah Dean, Sarah Bruce, MD. Go on 08/29/2017.   Specialty:  General Surgery Why:  at [address] Contact information: [place] [address] [phone]        Sarah Dean, Sarah Bruce, MD Follow up.   Specialty:  General Surgery Contact information: [place] [address] [phone]            The results of significant diagnostics from this hospitalization (including imaging, microbiology, ancillary and laboratory) are listed below for reference.    Significant Diagnostic Studies: No results found.  Labs: Basic Metabolic Panel:  Recent Labs Lab 08/13/17 1508 08/14/17 0550  NA  --  141  K  --  4.5  CL  --  106  CO2  --  27  GLUCOSE  --  99  BUN  --  9  CREATININE 0.72 0.63  CALCIUM  --  8.9   Liver Function Tests:  Recent Labs Lab 08/14/17 0550  AST 45*  ALT 68*  ALKPHOS 46  BILITOT 0.9  PROT 6.6  ALBUMIN 3.4*    CBC:  Recent Labs Lab 08/13/17 1258 08/13/17 1508 08/14/17 0550  WBC  --  8.7 7.7  NEUTROABS  --   --  6.3  HGB 12.4 12.8 11.9*  HCT 36.5 37.9 36.8  MCV  --  90.9 91.8  PLT  --  182 217    CBG: No results for input(s): GLUCAP in the last 168 hours.  Active Problems:   Morbid obesity ([place])   Time coordinating discharge: 37min

## 2017-08-14 NOTE — Progress Notes (Signed)
Patient alert and oriented, pain is controlled. Patient is tolerating fluids, advanced to protein shake today, patient is tolerating well.  Reviewed Gastric sleeve discharge instructions with patient and patient is able to articulate understanding.  Provided information on BELT program, Support Group and [facility] outpatient pharmacy. All questions answered, will continue to monitor.  

## 2017-08-14 NOTE — Plan of Care (Signed)
Problem: Food- and Nutrition-Related Knowledge Deficit (NB-1.1) Goal: Nutrition education Formal process to instruct or train a patient/client in a skill or to impart knowledge to help patients/clients voluntarily manage or modify food choices and eating behavior to maintain or improve health. Outcome: Completed/Met Date Met: 08/14/17 Nutrition Education Note  Received consult for diet education per DROP protocol.   Discussed 2 week post op diet with pt. Emphasized that liquids must be non carbonated, non caffeinated, and sugar free. Fluid goals discussed. Pt to follow up with outpatient bariatric RD for further diet progression after 2 weeks. Multivitamins and minerals also reviewed. Teach back method used, pt expressed understanding, expect good compliance.   Diet: First 2 Weeks  You will see the nutritionist about two (2) weeks after your surgery. The nutritionist will increase the types of foods you can eat if you are handling liquids well:  If you have severe vomiting or nausea and cannot handle clear liquids lasting longer than 1 day, call your surgeon  Protein Shake  Drink at least 2 ounces of shake 5-6 times per day  Each serving of protein shakes (usually 8 - 12 ounces) should have a minimum of:  15 grams of protein  And no more than 5 grams of carbohydrate  Goal for protein each day:  Men = 80 grams per day  Women = 60 grams per day  Protein powder may be added to fluids such as non-fat milk or Lactaid milk or Soy milk (limit to 35 grams added protein powder per serving)   Hydration  Slowly increase the amount of water and other clear liquids as tolerated (See Acceptable Fluids)  Slowly increase the amount of protein shake as tolerated  Sip fluids slowly and throughout the day  May use sugar substitutes in small amounts (no more than 6 - 8 packets per day; i.e. Splenda)   Fluid Goal  The first goal is to drink at least 8 ounces of protein shake/drink per day (or as directed  by the nutritionist); some examples of protein shakes are Premier Protein, Syntrax Nectar, Adkins Advantage, EAS Edge HP, and Unjury. See handout from pre-op Bariatric Education Class:  Slowly increase the amount of protein shake you drink as tolerated  You may find it easier to slowly sip shakes throughout the day  It is important to get your proteins in first  Your fluid goal is to drink 64 - 100 ounces of fluid daily  It may take a few weeks to build up to this  32 oz (or more) should be clear liquids  And  32 oz (or more) should be full liquids (see below for examples)  Liquids should not contain sugar, caffeine, or carbonation   Clear Liquids:  Water or Sugar-free flavored water (i.e. Fruit H2O, Propel)  Decaffeinated coffee or tea (sugar-free)  Crystal Lite, Wyler's Lite, Minute Maid Lite  Sugar-free Jell-O  Bouillon or broth  Sugar-free Popsicle: *Less than 20 calories each; Limit 1 per day   Full Liquids:  Protein Shakes/Drinks + 2 choices per day of other full liquids  Full liquids must be:  No More Than 12 grams of Carbs per serving  No More Than 3 grams of Fat per serving  Strained low-fat cream soup  Non-Fat milk  Fat-free Lactaid Milk  Sugar-free yogurt (Dannon Lite & Fit, Greek yogurt, Oikos Zero)   Deke Tilghman, MS, RD, LDN [facility] Clinical Dietitian Pager: [phone] After Hours Pager: [phone]     

## 2017-08-14 NOTE — Patient Outreach (Signed)
[facility]) Care Management  08/14/2017  Sarah Dean [birth year] [id number]   Subjective: Telephone call to patient's home number, no answer, left HIPAA compliant voicemail message, and requested call back.    Objective: Per KPN (Knowledge Performance Now, point of care tool) and chart review, patient hospitalized  08/13/17 - 08/14/17 for morbid obesity.  Status post LAPAROSCOPIC GASTRIC SLEEVE RESECTION, UPPER ENDO, POSSIBLE HIATAL HERNIA REPAIR at [facility] on 08/13/17.   Patient also has a history of Rheumatoid arthritis, Condyloma, and STD (sexually transmitted disease).       Assessment: Received referral for UMR Preoperative / Transition of care follow up referral on 07/27/17. Preoperative call/ transition of care, follow up pending patient contact.     Plan: RNCM will send unsuccessful outreach  letter, [facility] pamphlet, and proceed with case closure, within 10 business days if no return call.   Shakeia Krus H. Annia Friendly, BSN, [place] [facility] Telephonic CM Phone: [phone] Fax: [phone]

## 2017-08-14 NOTE — Progress Notes (Signed)
Patient alert and oriented, Post op day 1.  Provided support and encouragement.  Encouraged pulmonary toilet, ambulation and small sips of liquids.  Patient completed 12 ounces of clear fluid and 4 ounces of protein shake.  All questions answered.  Will continue to monitor.

## 2017-08-16 ENCOUNTER — Telehealth (HOSPITAL_COMMUNITY): Payer: Self-pay

## 2017-08-16 NOTE — Telephone Encounter (Signed)
Follow up with bariatric surgical patient to discuss post discharge questions.  No answer at this time voicemail left for patient along with contact information to discuss the following questions.  1.  Are you having any pain not relieved by pain medication?taking pain medication at night  2.  How much fluid total fluid intake have you had in the last 24/48 hours?  44 ounces   3.  How much protein intake have you had in the last 24/48 hours?60 grams of protein  4.  Have you had any trouble making urine?yes alot  5.  Have you had nausea that has not been relieved by nausea medication? No used medication several times but with relief  6.  Are you ambulating every hour?yes  7.  Are you passing gas or had a BM?No, passing gas has taken MOM 1 hour prior to call and used enema no results yet.  Patient counseled to call [facility] with contiued constipation and to increase water intake  8.  Do you know how to contact [place]? [facility]? [facility]?yes  9.  Are you taking your vitamins and calcium without difficulty?yes  10. Tell me how your incision looks?  Any redness, open incision, or drainage?Incisions look good itchy

## 2017-08-28 ENCOUNTER — Other Ambulatory Visit: Payer: Self-pay | Admitting: *Deleted

## 2017-08-28 ENCOUNTER — Encounter: Payer: 59 | Attending: General Surgery | Admitting: Registered"

## 2017-08-28 DIAGNOSIS — Z713 Dietary counseling and surveillance: Secondary | ICD-10-CM | POA: Insufficient documentation

## 2017-08-28 DIAGNOSIS — E669 Obesity, unspecified: Secondary | ICD-10-CM

## 2017-08-28 NOTE — Patient Outreach (Signed)
[facility]) Care Management  08/28/2017  Sarah Dean 29-Aug-1966 [id number]   No response from patient outreach attempts will proceed with case closure.     Objective: Per KPN (Knowledge Performance Now, point of care tool) and chart review, patient hospitalized  08/13/17 - 08/14/17 for morbid obesity.  Status post LAPAROSCOPIC GASTRIC SLEEVE RESECTION, UPPER ENDO, POSSIBLE HIATAL HERNIA REPAIR at [facility] on 08/13/17.   Patient also has a history of Rheumatoid arthritis, Condyloma, and STD (sexually transmitted disease).       Assessment: Received referral for UMR Preoperative / Transition of care follow up referral on 07/27/17.  Preoperative call follow up, Transition of care follow up not completed due to unable to contact patient and will proceed with case closure.     Plan: RNCM will send case closure due to unable to reach request to [place] [place] [facility].     Sarah Dean H. Annia Friendly, BSN, [place] Management [facility] Telephonic CM Phone: [phone] Fax: [phone]

## 2017-08-30 NOTE — Progress Notes (Signed)
Bariatric Class:  Appt start time: 1530 end time:  1630.  2 Week Post-Operative Nutrition Class  Patient was seen on 08/28/2017 for Post[facility]ative [facility]   Surgery date: 08/13/2017 Surgery type: Sleeve gastrectomy Start weight at [facility]: 408.7 Weight today: 346.8 Weight change: 61.9  Pt states she has been slightly moody and had a headache once or twice. Pt states she has experienced dizziness/lightheadedness.   TANITA  BODY COMP RESULTS  08/28/2017   BMI (kg/m^2) 56.0   Fat Mass (lbs) 182.8   Fat Free Mass (lbs) 164.0   Total Body Water (lbs) 123.2   The following the learning objectives were met by the patient during this course:  Identifies Phase 3A (Soft, High Proteins) Dietary Goals and will begin from 2 weeks post-operatively to 2 months post-operatively  Identifies appropriate sources of fluids and proteins   States protein recommendations and appropriate sources post-operatively  Identifies the need for appropriate texture modifications, mastication, and bite sizes when consuming solids  Identifies appropriate multivitamin and calcium sources post-operatively  Describes the need for physical activity post-operatively and will follow MD recommendations  States when to call healthcare provider regarding medication questions or post-operative complications  Handouts given during class include:  Phase 3A: Soft, High Protein Diet Handout  Follow-Up Plan: Patient will follow-up at [facility] in 6 weeks for 2 month post-op nutrition visit for diet advancement per MD.

## 2017-09-27 DIAGNOSIS — K912 Postsurgical malabsorption, not elsewhere classified: Secondary | ICD-10-CM | POA: Diagnosis not present

## 2017-09-27 DIAGNOSIS — E669 Obesity, unspecified: Secondary | ICD-10-CM | POA: Diagnosis not present

## 2017-09-27 DIAGNOSIS — Z9884 Bariatric surgery status: Secondary | ICD-10-CM | POA: Diagnosis not present

## 2017-09-27 DIAGNOSIS — R69 Illness, unspecified: Secondary | ICD-10-CM | POA: Diagnosis not present

## 2017-10-04 ENCOUNTER — Encounter: Payer: 59 | Attending: General Surgery | Admitting: Registered"

## 2017-10-04 ENCOUNTER — Encounter: Payer: Self-pay | Admitting: Registered"

## 2017-10-04 DIAGNOSIS — E669 Obesity, unspecified: Secondary | ICD-10-CM

## 2017-10-04 DIAGNOSIS — Z713 Dietary counseling and surveillance: Secondary | ICD-10-CM | POA: Insufficient documentation

## 2017-10-04 MED FILL — HUMIRA 40 MG/0.8ML PSKT: 40 | 28 days supply | Qty: 2 | Fill #3

## 2017-10-04 NOTE — Patient Instructions (Addendum)
Goals:  Follow Phase 3B: High Protein + Non-Starchy Vegetables  Eat 3-6 small meals/snacks, every 3-5 hrs  Increase lean protein foods to meet 60g goal  Increase fluid intake to 64oz +  Avoid drinking 15 minutes before, during and 30 minutes after eating  Aim for >30 min of physical activity daily  - Try Zumba when ready.

## 2017-10-04 NOTE — Progress Notes (Signed)
Follow-up visit:  8 Weeks Post-Operative Sleeve gastrectomy Surgery  Medical Nutrition Therapy:  Appt start time: 10:02 end time:  10:48.  Primary concerns today: Post-operative Bariatric Surgery Nutrition Management.  Non scale victories: ease with putting on socks and shoes, increased sex life able to reach unreachable parts, increased sex drive, 8 min on elliptical, ease with walking/less winded  Surgery date: 08/13/2017 Surgery type: Sleeve gastrectomy Start weight at [facility]: 408.7 lbs Weight today: 329 lbs (pt reported) Weight change: 17.8 lbs from 346.8 (08/28/2017) Total weight lost: 79.7 lbs Weight loss goal: reduce pain, be able to walk, to do more, be able to do [place]  08/28/2017 10/04/2017   BMI (kg/m^2) 56.0 Pt declined   Fat Mass (lbs) 182.8    Fat Free Mass (lbs) 164.0    Total Body Water (lbs) 123.2     Pt states she loves elliptical and lifting weights but does not like the bike or treadmill because it hurts her knees. Pt states she is no longer having cravings. Pt states she can tell when she's hungry. Pt states she is her own worst enemy at times; wants to return to [place] but talks herself out of it. 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 Pt states her next goal is to be under 300 lbs. Pt states she only drinks 64 oz when working out. Pt states she typically drinks 2 bottles of water,  sometimes 3 and definitely 4 bottles when working out.    Preferred Learning Style:   No preference indicated   Learning Readiness:   Ready  Change in progress  24-hr recall: B (AM): protein shake (25g), 2 bits of beef jerky Snk (AM): none L (PM): 4 oz beef patty (28g) with cheese (6g) or 3 oz Arby's roast beef Snk (PM): none  D (PM): greek yogurt (15g) Snk (PM): sugar free popsicle/fudge sicle (3 oz)  Fluid intake: water (2-4  bottles; 34-68 oz), herbal tea, protein shakes; 34-68 oz Estimated total protein intake: ~74 grams  Medications: See list Supplementation: Celebrate + 3 calcium   Using straws: no Drinking while eating: no Having you been chewing well: yes Chewing/swallowing difficulties: no Changes in vision: no Changes to mood/headaches: no Hair loss/Changes to skin/Changes to nails: change in texture, no, no Any difficulty focusing or concentrating: no Sweating: no Dizziness/Lightheaded: no Palpitations: no  Carbonated beverages: no N/V/D/C/GAS: no, no, no, yes, sometimes burping Abdominal Pain: no Dumping syndrome: no Last Lap-Band fill: N/A  Recent physical activity:  Lifting weights 60 min, 3x/week household  Progress Towards Goal(s):  In progress.  Handouts given during visit include:  Phase 3B: High protein + NS vegetables   Nutritional Diagnosis:  Inadequate fluid intake As related to bariatric surgery post-op recommendations.  As evidenced by pt report of consuming less than 64 fluid ounces consistently.    Intervention:  Nutrition education and counseling. Goals:  Follow Phase 3B: High Protein + Non-Starchy Vegetables  Eat 3-6 small meals/snacks, every 3-5 hrs  Increase lean protein foods to meet 60g goal  Increase fluid intake to 64oz +  Avoid drinking 15 minutes before, during and 30 minutes after eating  Aim for >30 min of physical activity daily - Try Zumba when ready.   Teaching Method Utilized:  Visual Auditory Hands on  Barriers to learning/adherence to lifestyle  change: none  Demonstrated degree of understanding via:  Teach Back   Monitoring/Evaluation:  Dietary intake, exercise, lap band fills, and body weight. Follow up in 3 months for 5 month post-op visit.

## 2017-10-23 ENCOUNTER — Other Ambulatory Visit: Payer: Self-pay | Admitting: Pharmacist

## 2017-10-23 MED ORDER — HUMIRA 40 MG/0.8ML ~~LOC~~ PSKT: 0.8000 mL | PREFILLED_SYRINGE | SUBCUTANEOUS | 5 refills | Status: DC

## 2017-10-23 NOTE — Telephone Encounter (Signed)
Called patient to schedule an appointment for the [employer] [facility]. I was unable to reach the patient so I left a HIPAA-compliant message requesting that the patient return my call.   

## 2017-10-25 MED FILL — HUMIRA 40 MG/0.8ML PSKT: 40 | 28 days supply | Qty: 2 | Fill #0

## 2017-11-02 ENCOUNTER — Ambulatory Visit (HOSPITAL_BASED_OUTPATIENT_CLINIC_OR_DEPARTMENT_OTHER): Payer: 59 | Admitting: Pharmacist

## 2017-11-02 ENCOUNTER — Encounter: Payer: Self-pay | Admitting: Pharmacist

## 2017-11-02 DIAGNOSIS — Z79899 Other long term (current) drug therapy: Secondary | ICD-10-CM

## 2017-11-02 NOTE — Progress Notes (Signed)
   S: Patient presents today to the [facility].  She reports she overall feels fine except for a headache that she has had today.   Patient is currently taking Humira for rheumatoid arthritis (RA). Patient is managed by Dr. Amil Amen for this.   Adherence: denies any missed doses. Rotates sites for both the Humira and methotrexate injections.   Dosing:  Rheumatoid arthritis: SubQ: 40 mg every other week   Drug-drug interactions: etodolac can increase levels of methotrexate but patient is on anti-rheumatologic dose of methotrexate.  Screening: TB test: completed per patient Hepatitis: completed per patient (AST/ALT slightly elevated - see below)  Monitoring: S/sx of infection: denies CBC: done q3 months (see below) S/sx of hypersensitivity: denies S/sx of malignancy: denies S/sx of heart failure: denies    O:     Lab Results  Component Value Date   WBC 7.7 08/14/2017   HGB 11.9 (L) 08/14/2017   HCT 36.8 08/14/2017   MCV 91.8 08/14/2017   PLT 217 08/14/2017      Chemistry      Component Value Date/Time   NA 141 08/14/2017 0550   K 4.5 08/14/2017 0550   CL 106 08/14/2017 0550   CO2 27 08/14/2017 0550   BUN 9 08/14/2017 0550   CREATININE 0.63 08/14/2017 0550      Component Value Date/Time   CALCIUM 8.9 08/14/2017 0550   ALKPHOS 46 08/14/2017 0550   AST 45 (H) 08/14/2017 0550   ALT 68 (H) 08/14/2017 0550   BILITOT 0.9 08/14/2017 0550      A/P: 1. Medication review: Patient on Humira for RA and tolerating it well with no adverse effects and improved RA control. Reviewed the medication with her, including the following: Humira is a TNF blocking agent indicated for ankylosing spondylitis, Crohn's disease, Hidradenitis suppurativa, psoriatic arthritis, plaque psoriasis, ulcerative colitis, and uveitis. The most common adverse effects are infections, headache, and injection site reactions. There is the possibility of an  increased risk of malignancy but it is not well understood if this increased risk is due to there medication or the disease state. There are rare cases of pancytopenia and aplastic anemia. No recommendations for any changes.   Christella Hartigan, PharmD, BCPS, BCACP, [facility] [phone]

## 2017-11-06 MED FILL — METHOTREXATE 25 MG/ML VIAL: 50 | 84 days supply | Qty: 10 | Fill #0

## 2017-11-06 MED FILL — PREMARIN 1.25 MG TABLET: 1.25 | 30 days supply | Qty: 30 | Fill #3

## 2017-11-08 DIAGNOSIS — Z6841 Body Mass Index (BMI) 40.0 and over, adult: Secondary | ICD-10-CM | POA: Diagnosis not present

## 2017-11-08 DIAGNOSIS — Z79899 Other long term (current) drug therapy: Secondary | ICD-10-CM | POA: Diagnosis not present

## 2017-11-08 DIAGNOSIS — M25562 Pain in left knee: Secondary | ICD-10-CM | POA: Diagnosis not present

## 2017-11-08 DIAGNOSIS — M0589 Other rheumatoid arthritis with rheumatoid factor of multiple sites: Secondary | ICD-10-CM | POA: Diagnosis not present

## 2017-11-08 DIAGNOSIS — M150 Primary generalized (osteo)arthritis: Secondary | ICD-10-CM | POA: Diagnosis not present

## 2017-11-23 MED FILL — HUMIRA 40 MG/0.8ML PSKT: 40 | 28 days supply | Qty: 2 | Fill #1

## 2017-12-04 MED FILL — FOLIC ACID 1 MG TABLET: 1 | 90 days supply | Qty: 180 | Fill #2

## 2017-12-04 MED FILL — PREMARIN 1.25 MG TABLET: 1.25 | 30 days supply | Qty: 30 | Fill #4

## 2018-01-01 MED FILL — PREMARIN 1.25 MG TABLET: 1.25 | 30 days supply | Qty: 30 | Fill #5

## 2018-01-02 ENCOUNTER — Encounter: Payer: Self-pay | Admitting: Registered"

## 2018-01-02 ENCOUNTER — Encounter: Payer: 59 | Attending: General Surgery | Admitting: Registered"

## 2018-01-02 DIAGNOSIS — E669 Obesity, unspecified: Secondary | ICD-10-CM

## 2018-01-02 DIAGNOSIS — Z713 Dietary counseling and surveillance: Secondary | ICD-10-CM | POA: Insufficient documentation

## 2018-01-02 NOTE — Patient Instructions (Signed)
-   Contact health care provider about pain associated with bowel movements.   - Aim to increase non-starchy vegetable intake.   - Aim to mindfully eat during meal and snack times.   - Increase physical activity.

## 2018-01-02 NOTE — Progress Notes (Signed)
Follow-up visit: 5 Months Post-Operative Sleeve gastrectomy Surgery  Medical Nutrition Therapy:  Appt start time: 9:10 end time: 9:55  Primary concerns today: Post-operative Bariatric Surgery Nutrition Management.  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 in the morning  Surgery date: 08/13/2017 Surgery type: Sleeve gastrectomy Start weight at [facility]: 408.7 lbs Weight today: 292.0 Weight change: 37 lbs loss from 329 (pt report on 10/04/2017) Total weight lost: 116.7 lbs Weight loss goal: reduce pain, be able to walk, to do more, be able to do [place]  08/28/2017 10/04/2017 01/02/2018   BMI (kg/m^2) 56.0 Pt declined 47.9   Fat Mass (lbs) 182.8  143.0   Fat Free Mass (lbs) 164.0  149.0   Total Body Water (lbs) 123.2  110.2    Pt states she loves elliptical and lifting weights but does not like the bike or treadmill because it hurts her knees. Pt states she is no longer having cravings. Pt states she can tell when she's hungry. Pt states she is her own worst enemy at times; wants to return to [place] but talks herself out of it. 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 Pt states her next goal is to be under 300 lbs. Pt states she only drinks 64 oz when working out. Pt states she typically drinks 2 bottles of water,  sometimes 3 and definitely 4 bottles when working out.    Preferred Learning Style:   No preference indicated   Learning Readiness:   Ready  Change in progress  24-hr recall: B (AM): [place] sausage (6g) Snk (AM): pork rinds (8g) L (PM): 4 oz beef patty (28g) with cheese (6g) or 3  oz Arby's roast beef Snk (PM): none  D (PM): 3-4 oz steak (21-28g), salad or greek yogurt (15g) Snk (PM): 1 Tbs peanut butter (3-4g)  Fluid intake: water (2-4 bottles; 34-68 oz), herbal tea, protein shakes; 50-80 oz Estimated total protein intake: 65-80 grams  Medications: See list Supplementation: Celebrate + 3 calcium   Using straws: no Drinking while eating: no Having you been chewing well: yes Chewing/swallowing difficulties: no Changes in vision: no Changes to mood/headaches: no Hair loss/Changes to skin/Changes to nails: change in texture, no, no Any difficulty focusing or concentrating: no Sweating: no Dizziness/Lightheaded: no Palpitations: no  Carbonated beverages: no N/V/D/C/GAS: no, no, no, yes, sometimes burping Abdominal Pain: no Dumping syndrome: no Last Lap-Band fill: N/A  Recent physical activity:  Limiting physical ability due to knee injury; yardwork  Progress Towards Goal(s):  In progress.  Handouts given during visit include:  Phase 3B: High protein + NS vegetables   Nutritional Diagnosis:  [place]-3.3 Overweight/obesity related to past poor dietary habits and physical inactivity as evidenced by patient w/ recent sleeve gastrectomy surgery following dietary guidelines for continued weight loss.    Intervention:  Nutrition education and counseling. Goals: - Contact health care provider about pain associated with bowel movements.  - Aim to increase non-starchy vegetable intake.  - Aim to mindfully eat during meal and snack times.  - Increase physical activity.   Teaching Method Utilized:  Visual  Auditory Hands on  Barriers to learning/adherence to lifestyle change: none  Demonstrated degree of understanding via:  Teach Back   Monitoring/Evaluation:  Dietary intake, exercise, lap band fills, and body weight. Follow up in 3 months for 5 month post-op visit.

## 2018-01-08 MED FILL — HUMIRA 40 MG/0.8ML PSKT: 40 | 28 days supply | Qty: 2 | Fill #2

## 2018-01-17 DIAGNOSIS — Z9884 Bariatric surgery status: Secondary | ICD-10-CM | POA: Diagnosis not present

## 2018-01-17 DIAGNOSIS — E669 Obesity, unspecified: Secondary | ICD-10-CM | POA: Diagnosis not present

## 2018-01-17 DIAGNOSIS — K912 Postsurgical malabsorption, not elsewhere classified: Secondary | ICD-10-CM | POA: Diagnosis not present

## 2018-01-17 DIAGNOSIS — R69 Illness, unspecified: Secondary | ICD-10-CM | POA: Diagnosis not present

## 2018-01-23 MED FILL — METHOTREXATE 25 MG/ML VIAL: 50 | 84 days supply | Qty: 10 | Fill #1

## 2018-02-01 MED FILL — PREMARIN 1.25 MG TABLET: 1.25 | 30 days supply | Qty: 30 | Fill #6

## 2018-02-05 ENCOUNTER — Other Ambulatory Visit: Payer: Self-pay | Admitting: Pharmacist

## 2018-02-05 DIAGNOSIS — Z79899 Other long term (current) drug therapy: Secondary | ICD-10-CM | POA: Diagnosis not present

## 2018-02-05 DIAGNOSIS — M0589 Other rheumatoid arthritis with rheumatoid factor of multiple sites: Secondary | ICD-10-CM | POA: Diagnosis not present

## 2018-02-05 DIAGNOSIS — Z6841 Body Mass Index (BMI) 40.0 and over, adult: Secondary | ICD-10-CM | POA: Diagnosis not present

## 2018-02-05 DIAGNOSIS — M150 Primary generalized (osteo)arthritis: Secondary | ICD-10-CM | POA: Diagnosis not present

## 2018-02-05 MED ORDER — ADALIMUMAB 40 MG/0.4ML ~~LOC~~ PSKT: 0.4000 mL | PREFILLED_SYRINGE | SUBCUTANEOUS | 5 refills | Status: DC

## 2018-02-05 MED ORDER — ADALIMUMAB 40 MG/0.4ML ~~LOC~~ PSKT: 0.8000 mL | PREFILLED_SYRINGE | SUBCUTANEOUS | 5 refills | Status: DC

## 2018-02-07 MED FILL — HUMIRA CF 40 MG/0.4ML PSKT: 40 | 28 days supply | Qty: 2 | Fill #0

## 2018-02-11 MED FILL — BUMETANIDE 1 MG TABLET: 1 | 30 days supply | Qty: 30 | Fill #2

## 2018-03-01 MED FILL — PREMARIN 1.25 MG TABLET: 1.25 | 30 days supply | Qty: 30 | Fill #7

## 2018-03-21 MED FILL — BUMETANIDE 1 MG TABLET: 1 | 30 days supply | Qty: 30 | Fill #0

## 2018-03-22 MED FILL — HUMIRA CF 40 MG/0.4ML PSKT: 40 | 28 days supply | Qty: 2 | Fill #1

## 2018-04-03 MED FILL — FOLIC ACID 1 MG TABS: 1 | 90 days supply | Qty: 180 | Fill #0

## 2018-04-03 MED FILL — PREMARIN 1.25 MG TABLET: 1.25 | 30 days supply | Qty: 30 | Fill #8

## 2018-04-04 ENCOUNTER — Encounter: Payer: Self-pay | Admitting: Registered"

## 2018-04-04 ENCOUNTER — Encounter: Payer: 59 | Attending: General Surgery | Admitting: Registered"

## 2018-04-04 DIAGNOSIS — Z9884 Bariatric surgery status: Secondary | ICD-10-CM | POA: Diagnosis not present

## 2018-04-04 DIAGNOSIS — Z713 Dietary counseling and surveillance: Secondary | ICD-10-CM | POA: Insufficient documentation

## 2018-04-04 DIAGNOSIS — E669 Obesity, unspecified: Secondary | ICD-10-CM | POA: Insufficient documentation

## 2018-04-04 NOTE — Progress Notes (Signed)
Follow-up visit: 8 Months Post-Operative Sleeve gastrectomy Surgery  Medical Nutrition Therapy:  Appt start time: 10:20 end time: 11:02  Primary concerns today: Post-operative Bariatric Surgery Nutrition Management.  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, pain has reduced   Surgery date: 08/13/2017 Surgery type: Sleeve gastrectomy Start weight at [facility]: 408.7 lbs Weight today: 265.8 Weight change: 26.2 lbs loss from 292.0 (01/02/2018) Total weight lost: 142.9 lbs Weight loss goal: reduce pain, be able to walk, to do more, be able to do Zumba, wants to be around 200 lbs  TANITA  BODY COMP RESULTS  08/28/2017 10/04/2017 01/02/2018 04/04/2018   BMI (kg/m^2) 56.0 Pt declined 47.9 44.9   Fat Mass (lbs) 182.8  143.0 119.0   Fat Free Mass (lbs) 164.0  149.0 146.8   Total Body Water (lbs) 123.2  110.2 107.6    Pt states she has accomplished goals she originally intended for surgery. Pt states she wants to be around 200 lbs, although she has not been that size since grade school. Pt states she has not been to [place] lately because she does not like drama. 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 Pt states she has been trying to add spinach into her daily regimen but gets too full on protein. Pt states she did not speak with her healthcare provider about pain when having bowel movements. Pt has not increased physical activity or intake of non-starchy vegetables since previous visit.   Pt states she loves elliptical and lifting weights but does not like the bike or treadmill because it hurts her knees.    Preferred Learning Style:   No  preference indicated   Learning Readiness:   Ready  Change in progress  24-hr recall: B (AM): greek yogurt (15g)  Snk (AM): none L (PM): tuna pack (15g), mayo, pickles, cheese (7g), crackers Snk (PM): none  D (PM): Grilled chicken kabob (21g), french fries  Snk (PM): none  Fluid intake: water (2-4 bottles; 34-68 oz), herbal tea; 64+ oz Estimated total protein intake: ~58 grams  Medications: See list Supplementation: Celebrate + 3 calcium   Using straws: no Drinking while eating: sometimes with chicken Having you been chewing well: yes Chewing/swallowing difficulties: no Changes in vision: no Changes to mood/headaches: no Hair loss/Changes to skin/Changes to nails: losing a little, no, no Any difficulty focusing or concentrating: no Sweating: no Dizziness/Lightheaded: no Palpitations: no  Carbonated beverages: no N/V/D/C/GAS: no, no, no, yes, sometimes burping Abdominal Pain: no Dumping syndrome: no Last Lap-Band fill: N/A  Recent physical activity: Limiting physical ability due to knee injury; yardwork  Progress Towards Goal(s):  In progress.  Handouts given during visit include:  Phase V: High protein + All vegetables   Nutritional Diagnosis:  [place]-3.3 Overweight/obesity related to past poor dietary habits and physical inactivity as evidenced by patient w/ recent sleeve gastrectomy surgery following dietary guidelines for continued weight loss.    Intervention:  Nutrition education and counseling. Pt was educated and counseled on the next phase of diet. Pt was encouraged to track protein to ensure she is meeting her daily needs. Pt was educated and counseled on ways to increase  non-starchy vegetable intake and resistance exercises.  Goals: - Track protein intake daily.  - Increase fiber intake. Have snacks of non-starchy vegetables between meals.  - Try to have vegetables with lunch and dinner.  - Increase resistance and/or strengthening exercises.  Teaching  Method Utilized:  Visual Auditory Hands on  Barriers to learning/adherence to lifestyle change: none  Demonstrated degree of understanding via:  Teach Back   Monitoring/Evaluation:  Dietary intake, exercise, lap band fills, and body weight. Follow up in 4 months for 12 month post-op visit.

## 2018-04-04 NOTE — Patient Instructions (Addendum)
-   Track protein intake daily.   - Increase fiber intake. Have snacks of non-starchy vegetables between meals.   - Try to have vegetables with lunch and dinner.   - Increase resistance and/or strengthening exercises.

## 2018-04-16 MED FILL — HUMIRA CF 40 MG/0.4ML PSKT: 40 | 28 days supply | Qty: 2 | Fill #2

## 2018-05-02 MED FILL — METHOTREXATE 25 MG/ML VIAL: 50 | 84 days supply | Qty: 10 | Fill #0

## 2018-05-02 MED FILL — PREMARIN 1.25 MG TABLET: 1.25 | 30 days supply | Qty: 30 | Fill #9

## 2018-05-02 MED FILL — BUMETANIDE 1 MG TABLET: 1 | 30 days supply | Qty: 30 | Fill #1

## 2018-05-07 ENCOUNTER — Ambulatory Visit (INDEPENDENT_AMBULATORY_CARE_PROVIDER_SITE_OTHER): Payer: 59 | Admitting: Certified Nurse Midwife

## 2018-05-07 ENCOUNTER — Encounter: Payer: Self-pay | Admitting: Certified Nurse Midwife

## 2018-05-07 ENCOUNTER — Other Ambulatory Visit: Payer: Self-pay

## 2018-05-07 VITALS — BP 116/68 | HR 70 | Resp 16 | Ht 65.25 in | Wt 266.0 lb

## 2018-05-07 DIAGNOSIS — M150 Primary generalized (osteo)arthritis: Secondary | ICD-10-CM | POA: Diagnosis not present

## 2018-05-07 DIAGNOSIS — N6310 Unspecified lump in the right breast, unspecified quadrant: Secondary | ICD-10-CM | POA: Diagnosis not present

## 2018-05-07 DIAGNOSIS — Z8739 Personal history of other diseases of the musculoskeletal system and connective tissue: Secondary | ICD-10-CM

## 2018-05-07 DIAGNOSIS — N6320 Unspecified lump in the left breast, unspecified quadrant: Secondary | ICD-10-CM | POA: Diagnosis not present

## 2018-05-07 DIAGNOSIS — Z79899 Other long term (current) drug therapy: Secondary | ICD-10-CM | POA: Diagnosis not present

## 2018-05-07 DIAGNOSIS — M0589 Other rheumatoid arthritis with rheumatoid factor of multiple sites: Secondary | ICD-10-CM | POA: Diagnosis not present

## 2018-05-07 DIAGNOSIS — Z01411 Encounter for gynecological examination (general) (routine) with abnormal findings: Secondary | ICD-10-CM | POA: Diagnosis not present

## 2018-05-07 DIAGNOSIS — N951 Menopausal and female climacteric states: Secondary | ICD-10-CM | POA: Diagnosis not present

## 2018-05-07 DIAGNOSIS — Z6841 Body Mass Index (BMI) 40.0 and over, adult: Secondary | ICD-10-CM | POA: Diagnosis not present

## 2018-05-07 DIAGNOSIS — R928 Other abnormal and inconclusive findings on diagnostic imaging of breast: Secondary | ICD-10-CM | POA: Diagnosis not present

## 2018-05-07 NOTE — Progress Notes (Signed)
52 y.o. G2P0020 Married  Caucasian Fe here for annual exam. Menopausal no HRT. Denies vaginal dryness or vaginal bleeding. Has noted changes since weight loss surgery of gastric sleeve. Has lost 132 pound weight loss from last aex. Has slowed on exercise and weight lost has slowed down. Sees GI for follow up periodically. Sees Dr. [name] PCP yearly for aex and labs. Aware colonoscopy is due this year. Has not made plans to schedule as of yet. Sees Dr. Oneida Alar. Continues with [name] for RA management. No other health issues today.  Patient's last menstrual period was 04/25/2007.          Sexually active: Yes.    The current method of family planning is status post hysterectomy.    Exercising: No.  exercise Smoker:  no  Health Maintenance: Pap:  03-22-12 neg History of Abnormal Pap: yrs ago MMG:  05-09-17 category b density birads 1:neg, Self Breast exams: no Colonoscopy:  2016 polyps f/u 90yrs BMD:   none TDaP:  2010 Shingles: no Pneumonia: no Hep C and HIV: HIV neg yrs ago Labs: no   reports that she has quit smoking. She has never used smokeless tobacco. She reports that she has current or past drug history. Drug: Marijuana. She reports that she does not drink alcohol.  Past Medical History:  Diagnosis Date  . Condyloma   . Morbid obesity ([place])   . Pneumonia    hx of   . Rheumatoid arthritis ([place]) 2005  . STD (sexually transmitted disease)    + trich  . Tubal ectopic pregnancy    times 2    Past Surgical History:  Procedure Laterality Date  . ANKLE SURGERY    . CHOLECYSTECTOMY  04/25/07  . COLONOSCOPY N/A 03/24/2015   NFA:OZHYQ external hemorrhoids/moderate sized internal hemorrhoids/moderate diverticulosis in the sigmoid colon  . HEMORRHOID BANDING N/A 03/24/2015   Procedure: HEMORRHOID BANDING;  Surgeon: Danie Binder, MD;  Location: [facility];  Service: Endoscopy;  Laterality: N/A;  . LAPAROSCOPIC GASTRIC SLEEVE RESECTION N/A 08/13/2017   Procedure: LAPAROSCOPIC  GASTRIC SLEEVE RESECTION, UPPER ENDOSCOPY HIATAL HERNIA REPAIR;  Surgeon: Kinsinger, [name], MD;  Location: [facility];  Service: General;  Laterality: N/A;  . TOTAL ABDOMINAL HYSTERECTOMY  04/25/07   BSO    Current Outpatient Medications  Medication Sig Dispense Refill  . Adalimumab (HUMIRA) 40 MG/0.4ML PSKT Inject 0.4 mLs into the skin every 14 (fourteen) days. (every other week) 2 each 5  . bumetanide (BUMEX) 1 MG tablet Take 1 tablet by mouth daily as needed (fluid). occ    . cetirizine (ZYRTEC) 10 MG tablet Take 10 mg by mouth daily.    . cholecalciferol (VITAMIN D) 1000 units tablet Take 1 tablet by mouth daily.     . Coenzyme Q10 (COQ10) 200 MG CAPS Take 1 capsule by mouth daily.    . folic acid (FOLVITE) 1 MG tablet Take 2 mg by mouth daily.     . magnesium oxide (MAG-OX) 400 MG tablet Take 400 mg by mouth daily.    . methotrexate 1 G injection Inject into the vein every 7 (seven) days.    . Multiple Vitamins-Minerals (ALIVE ONCE DAILY WOMENS PO) Take 1 tablet by mouth daily.    . Probiotic Product (PROBIOTIC PO) Take 1 capsule by mouth daily.      No current facility-administered medications for this visit.     Family History  Problem Relation Age of Onset  . Cancer Father   . Cirrhosis Father   .  Colon cancer Neg Hx     ROS:  Pertinent items are noted in HPI.  Otherwise, a comprehensive ROS was negative.  Exam:   LMP 04/25/2007    Ht Readings from Last 3 Encounters:  04/04/18 5' 4.5" (1.638 m)  01/02/18 5' 4.5" (1.638 m)  10/04/17 5' 4.5" (1.638 m)    General appearance: alert, cooperative and appears stated age Head: Normocephalic, without obvious abnormality, atraumatic Neck: no adenopathy, supple, symmetrical, trachea midline and thyroid normal to inspection and palpation Lungs: clear to auscultation bilaterally Breasts: normal appearance, no masses or tenderness, No nipple retraction or dimpling, No nipple discharge or bleeding, No axillary or supraclavicular  adenopathy, nodular findings on left breast at 12 o'clock 3 Fb out from aerola and on right at 1 o'clock 2-3 3 fb out from aerola, ? skin change from weight loss Heart: regular rate and rhythm Abdomen: soft, non-tender; no masses,  no organomegaly Extremities: extremities normal, atraumatic, no cyanosis or edema Skin: Skin color, texture, turgor normal. No rashes or lesions, Excessive skin noted in abdominal and thigh area Lymph nodes: Cervical, supraclavicular, and axillary nodes normal. No abnormal inguinal nodes palpated Neurologic: Grossly normal   Pelvic: External genitalia:  no lesions, normal female              Urethra:  normal appearing urethra with no masses, tenderness or lesions              Bartholin's and Skene's: normal                 Vagina: normal appearing vagina with normal color and discharge, no lesions              Cervix: absent              Pap taken: No. Bimanual Exam:  Uterus:  uterus absent              Adnexa: no mass, fullness, tenderness and adnexa surgically absent bilateral               Rectovaginal: Confirms               Anus:  normal sphincter tone,hemorrhoid tag noted, no thrombosed hemorrhoid noted  Chaperone present: yes  A:  Well Woman with normal exam  Menopausal no HRT  S/P TAH with BSO  Breast mass bilateral vs fibroglandular change with weight loss  Excessive skin due to weight loss noted  Rheumatology management of RA.  Colonoscopy due  P:   Reviewed health and wellness pertinent to exam  Discussed coconut or Olive oil for vaginal dryness. Questions addressed.  Discussed breast finding bilateral, which could be result of weight loss change in feel of area. Discussed need for evaluation and due for mammogram also. Patient agreeable and will be scheduled prior to leaving today.  Encouraged regular exercise to help her weight loss journey continue and follow up with Bariatric clinic.  Continue follow up with rheumatology as  indicated.  Patient will call to schedule and will advise if has problems.    Pap smear: no   counseled on breast self exam, mammography screening, adequate intake of calcium and vitamin D, diet and exercise, Kegel's exercises  return annually or prn  An After Visit Summary was printed and given to the patient.

## 2018-05-07 NOTE — Progress Notes (Signed)
Patient scheduled while in office for bilateral Dx MMG and [place] if needed. Scheduled at [facility] today at 3:15pm. Copy of order given to patient and faxed. Patient verbalizes understanding and is agreeable.

## 2018-05-09 ENCOUNTER — Telehealth: Payer: Self-pay | Admitting: *Deleted

## 2018-05-09 NOTE — Telephone Encounter (Signed)
Call placed to patient to schedule 2 wk breast recheck per Melvia Heaps, CNM.   Spoke with patient, OV scheduled for 05/22/18 at [facility] to provider for final review. Patient is agreeable to disposition. Will close encounter.

## 2018-05-13 MED FILL — HUMIRA CF 40 MG/0.4ML PSKT: 40 | 28 days supply | Qty: 2 | Fill #3

## 2018-05-22 ENCOUNTER — Encounter: Payer: Self-pay | Admitting: Certified Nurse Midwife

## 2018-05-22 ENCOUNTER — Ambulatory Visit: Payer: 59 | Admitting: Certified Nurse Midwife

## 2018-05-22 ENCOUNTER — Other Ambulatory Visit: Payer: Self-pay

## 2018-05-22 VITALS — BP 100/68 | HR 68 | Resp 16 | Ht 65.25 in | Wt 261.0 lb

## 2018-05-22 DIAGNOSIS — R634 Abnormal weight loss: Secondary | ICD-10-CM | POA: Diagnosis not present

## 2018-05-22 DIAGNOSIS — Z0000 Encounter for general adult medical examination without abnormal findings: Secondary | ICD-10-CM

## 2018-05-22 NOTE — Progress Notes (Signed)
   Subjective:   52 y.o. MarriedCaucasian female presents for follow up of breast areas noted at aex. Discussed at exam could be related to breast size change with 132 pound weight loss. Patient has not noted any changes since visit here. Mammogram was negative for changes. Denies nipple discharge only skin change with loose skin.   Review of Systems Pertinent to HPI only.   Objective:   General appearance: alert, cooperative, appears stated age, no distress and moderately obese Breasts: normal appearance, no masses or tenderness, No nipple retraction or dimpling, No nipple discharge or bleeding, No axillary or supraclavicular adenopathy, Loose skin noted bilateral from weight loss, but areas of concern feel related to fibroglandular changes weight loss changes, no prominent feel at this time   Assessment:   ASSESSMENT:Patient is diagnosed with normal breast changes related to weight loss, fibroglandular feel  Plan:   PLAN: Reviewed findings with patient and importance of SBE with changes she is experiencing and if notes any changes needs to come for breast exam. Patient agreeable.  Rv prn

## 2018-05-22 NOTE — Patient Instructions (Signed)
Breast Self-Awareness Breast self-awareness means:  Knowing how your breasts look.  Knowing how your breasts feel.  Checking your breasts every month for changes.  Telling your doctor if you notice a change in your breasts.  Breast self-awareness allows you to notice a breast problem early while it is still small. How to do a breast self-exam One way to learn what is normal for your breasts and to check for changes is to do a breast self-exam. To do a breast self-exam: Look for Changes  1. Take off all the clothes above your waist. 2. Stand in front of a mirror in a room with good lighting. 3. Put your hands on your hips. 4. Push your hands down. 5. Look at your breasts and nipples in the mirror to see if one breast or nipple looks different than the other. Check to see if: ? The shape of one breast is different. ? The size of one breast is different. ? There are wrinkles, dips, and bumps in one breast and not the other. 6. Look at each breast for changes in your skin, such as: ? Redness. ? Scaly areas. 7. Look for changes in your nipples, such as: ? Liquid around the nipples. ? Bleeding. ? Dimpling. ? Redness. ? A change in where the nipples are. Feel for Changes 1. Lie on your back on the floor. 2. Feel each breast. To do this, follow these steps: ? Pick a breast to feel. ? Put the arm closest to that breast above your head. ? Use your other arm to feel the nipple area of your breast. Feel the area with the pads of your three middle fingers by making small circles with your fingers. For the first circle, press lightly. For the second circle, press harder. For the third circle, press even harder. ? Keep making circles with your fingers at the light, harder, and even harder pressures as you move down your breast. Stop when you feel your ribs. ? Move your fingers a little toward the center of your body. ? Start making circles with your fingers again, this time going up until  you reach your collarbone. ? Keep making up and down circles until you reach your armpit. Remember to keep using the three pressures. ? Feel the other breast in the same way. 3. Sit or stand in the shower or tub. 4. With soapy water on your skin, feel each breast the same way you did in step 2, when you were lying on the floor. Write Down What You Find  After doing the self-exam, write down:  What is normal for each breast.  Any changes you find in each breast.  When you last had your period.  How often should I check my breasts? Check your breasts every month. If you are breastfeeding, the best time to check them is after you feed your baby or after you use a breast pump. If you get periods, the best time to check your breasts is 5-7 days after your period is over. When should I see my doctor? See your doctor if you notice:  A change in shape or size of your breasts or nipples.  A change in the skin of your breast or nipples, such as red or scaly skin.  Unusual fluid coming from your nipples.  A lump or thick area that was not there before.  Pain in your breasts.  Anything that concerns you.  This information is not intended to replace advice given to   you by your health care provider. Make sure you discuss any questions you have with your health care provider. Document Released: 03/20/2008 Document Revised: 03/09/2016 Document Reviewed: 08/22/2015 Elsevier Interactive Patient Education  2018 Elsevier Inc.  

## 2018-05-28 ENCOUNTER — Encounter: Payer: Self-pay | Admitting: Certified Nurse Midwife

## 2018-06-04 ENCOUNTER — Other Ambulatory Visit: Payer: Self-pay | Admitting: Obstetrics & Gynecology

## 2018-06-04 DIAGNOSIS — N951 Menopausal and female climacteric states: Secondary | ICD-10-CM

## 2018-06-04 MED FILL — BUMETANIDE 1 MG TABLET: 1 | 30 days supply | Qty: 30 | Fill #2

## 2018-06-04 NOTE — Telephone Encounter (Signed)
Medication refill request: Premarin 1.25 mg Last AEX:  05/07/2018 Next AEX: 05/14/2019 Last MMG (if hormonal medication request): 05/07/2018 Density B, Birads 2 benign Refill authorized: Premarin 1.25 mg #30 [id number]  Please advise

## 2018-06-07 ENCOUNTER — Telehealth: Payer: Self-pay | Admitting: Certified Nurse Midwife

## 2018-06-07 ENCOUNTER — Other Ambulatory Visit: Payer: Self-pay | Admitting: Obstetrics & Gynecology

## 2018-06-07 DIAGNOSIS — N951 Menopausal and female climacteric states: Secondary | ICD-10-CM

## 2018-06-07 MED FILL — PREMARIN 1.25 MG TABLET: 1.25 | 30 days supply | Qty: 30 | Fill #0

## 2018-06-07 NOTE — Telephone Encounter (Signed)
Patient calling about prescription. Routing to Melvia Heaps, CNM to review and advise. Patient had aex on 05-07-18 and looks like refill was not sent at aex. Please advise.

## 2018-06-07 NOTE — Telephone Encounter (Signed)
Patient notified refill had been sent to pharmacy for her. See refill encounter dated 06-04-18.  Encounter closed.

## 2018-06-07 NOTE — Telephone Encounter (Signed)
Patient is returning a call to [place]. No open telephone note.

## 2018-06-07 NOTE — Telephone Encounter (Signed)
Message left to return call to Emily at [phone].    

## 2018-06-07 NOTE — Telephone Encounter (Addendum)
Returned call and notified prescription had been sent to pharmacy for her. Verbalized understanding.   Encounter closed.

## 2018-06-07 NOTE — Telephone Encounter (Signed)
Patient calling to check status of refill request.

## 2018-06-13 MED FILL — HUMIRA CF 40 MG/0.4ML PSKT: 40 | 28 days supply | Qty: 2 | Fill #4

## 2018-07-04 MED FILL — PREMARIN 1.25 MG TABLET: 1.25 | 30 days supply | Qty: 30 | Fill #1

## 2018-07-04 MED FILL — FOLIC ACID 1 MG TABS: 1 | 90 days supply | Qty: 180 | Fill #1

## 2018-07-04 MED FILL — BUMETANIDE 1 MG TABLET: 1 | 30 days supply | Qty: 30 | Fill #3

## 2018-07-11 MED FILL — HUMIRA CF 40 MG/0.4ML PSKT: 40 | 28 days supply | Qty: 2 | Fill #5

## 2018-07-30 ENCOUNTER — Encounter: Payer: 59 | Attending: General Surgery | Admitting: Skilled Nursing Facility1

## 2018-07-30 DIAGNOSIS — Z713 Dietary counseling and surveillance: Secondary | ICD-10-CM | POA: Diagnosis not present

## 2018-07-30 DIAGNOSIS — Z6841 Body Mass Index (BMI) 40.0 and over, adult: Secondary | ICD-10-CM | POA: Insufficient documentation

## 2018-07-30 DIAGNOSIS — E669 Obesity, unspecified: Secondary | ICD-10-CM | POA: Insufficient documentation

## 2018-08-01 ENCOUNTER — Encounter: Payer: Self-pay | Admitting: Skilled Nursing Facility1

## 2018-08-01 NOTE — Progress Notes (Signed)
Bariatric Class:  Appt start time: 6:00 end time: 7:00  12 Month Post-Operative Nutrition Class  Patient was seen on 07/30/2018 for Post-Operative Nutrition education at [facility].   Pt is struggling with her relationship with food but trying to work on it.   Surgery date: 08/13/2017 Surgery type: Sleeve gastrectomy Start weight at [facility]: 408.7 lbs Weight today: 253.4 Weight loss goal: reduce pain, be able to walk, to do more, be able to do Zumba, wants to be around 200 lbs  TANITA  BODY COMP RESULTS  08/28/2017 01/02/2018 04/04/2018 07/30/2018   BMI (kg/m^2) 56.0 47.9 44.9 42.2   Fat Mass (lbs) 182.8 143.0 119.0 109.2   Fat Free Mass (lbs) 164.0 149.0 146.8 144.2   Total Body Water (lbs) 123.2 110.2 107.6 105.2    The following the learning objectives were met by the patient during this course:  Review of TANITA scale information  Share and discuss bariatric surgery successes and non-scale victories  Identifies Phase VII (Maintenance Phase) Dietary Goals which will be lifelong  Identifies appropriate sources of fluids, proteins, non-starchy vegetables, and complex carbohydrates  Identifies well-balanced meals  Identifies portion control   Identifies appropriate multivitamin and calcium sources post-operatively  Describes the need for physical activity post-operatively and will follow MD recommendations  Identifies and describes SMART goals   Creates at least 2 SMART goals to begin immediately  States when to call healthcare provider regarding medication questions or post-operative complications  Handouts given during class include:  Phase VII: Maintenance Phase-Lifelong  Follow-Up Plan: Patient will follow-up at [facility] for on-going post-op nutrition visits.

## 2018-08-07 DIAGNOSIS — M0589 Other rheumatoid arthritis with rheumatoid factor of multiple sites: Secondary | ICD-10-CM | POA: Diagnosis not present

## 2018-08-07 DIAGNOSIS — Z6841 Body Mass Index (BMI) 40.0 and over, adult: Secondary | ICD-10-CM | POA: Diagnosis not present

## 2018-08-07 DIAGNOSIS — Z79899 Other long term (current) drug therapy: Secondary | ICD-10-CM | POA: Diagnosis not present

## 2018-08-07 DIAGNOSIS — M150 Primary generalized (osteo)arthritis: Secondary | ICD-10-CM | POA: Diagnosis not present

## 2018-08-08 MED FILL — METHOTREXATE 25 MG/ML VIAL: 50 | 84 days supply | Qty: 10 | Fill #1

## 2018-08-08 MED FILL — PREMARIN 1.25 MG TABLET: 1.25 | 30 days supply | Qty: 30 | Fill #2

## 2018-08-08 MED FILL — BUMETANIDE 1 MG TABLET: 1 | 30 days supply | Qty: 30 | Fill #0

## 2018-08-09 ENCOUNTER — Other Ambulatory Visit: Payer: Self-pay | Admitting: Pharmacist

## 2018-08-09 MED ORDER — ADALIMUMAB 40 MG/0.4ML ~~LOC~~ PSKT: 0.4000 mL | PREFILLED_SYRINGE | SUBCUTANEOUS | 5 refills | Status: DC

## 2018-08-09 MED FILL — HUMIRA CF 40 MG/0.4ML PSKT: 40 | 28 days supply | Qty: 2 | Fill #0

## 2018-09-02 MED FILL — PREMARIN 1.25 MG TABLET: 1.25 | 30 days supply | Qty: 30 | Fill #3

## 2018-09-02 MED FILL — BUMETANIDE 1 MG TABLET: 1 | 30 days supply | Qty: 30 | Fill #1

## 2018-09-03 ENCOUNTER — Ambulatory Visit: Payer: Self-pay

## 2018-09-06 NOTE — Progress Notes (Signed)
REVIEWED-NO ADDITIONAL RECOMMENDATIONS. 

## 2018-09-20 MED FILL — HUMIRA CF 40 MG/0.4ML PSKT: 40 | 28 days supply | Qty: 2 | Fill #1

## 2018-09-26 DIAGNOSIS — R69 Illness, unspecified: Secondary | ICD-10-CM | POA: Diagnosis not present

## 2018-09-26 DIAGNOSIS — Z9884 Bariatric surgery status: Secondary | ICD-10-CM | POA: Diagnosis not present

## 2018-09-26 DIAGNOSIS — K912 Postsurgical malabsorption, not elsewhere classified: Secondary | ICD-10-CM | POA: Diagnosis not present

## 2018-09-26 DIAGNOSIS — E669 Obesity, unspecified: Secondary | ICD-10-CM | POA: Diagnosis not present

## 2018-10-01 MED FILL — FOLIC ACID 1 MG TABS: 1 | 90 days supply | Qty: 180 | Fill #2

## 2018-10-01 MED FILL — PREMARIN 1.25 MG TABLET: 1.25 | 30 days supply | Qty: 30 | Fill #4

## 2018-10-01 MED FILL — BUMETANIDE 1 MG TABLET: 1 | 30 days supply | Qty: 30 | Fill #2

## 2018-10-22 MED FILL — HUMIRA CF 40 MG/0.4ML PSKT: 40 | 28 days supply | Qty: 2 | Fill #2

## 2018-10-29 ENCOUNTER — Encounter: Payer: 59 | Attending: General Surgery | Admitting: Skilled Nursing Facility1

## 2018-10-29 ENCOUNTER — Encounter: Payer: Self-pay | Admitting: Skilled Nursing Facility1

## 2018-10-29 DIAGNOSIS — Z6841 Body Mass Index (BMI) 40.0 and over, adult: Secondary | ICD-10-CM | POA: Diagnosis not present

## 2018-10-29 DIAGNOSIS — E669 Obesity, unspecified: Secondary | ICD-10-CM | POA: Insufficient documentation

## 2018-10-29 DIAGNOSIS — Z713 Dietary counseling and surveillance: Secondary | ICD-10-CM | POA: Insufficient documentation

## 2018-10-29 NOTE — Progress Notes (Signed)
Follow-up visit: Post-Operative Sleeve Surgery   Pt states she gained some weight over the holidays from not paying attention but then lost it. Pt states her surgeon told her she will not be losing more weight. Pt states he has not been working out like she had. Pt states when she does go for walks she is walking more miles.  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  Pt states she will come back after busy season  Primary concerns today: Post-operative Bariatric Surgery Nutrition Management. Surgery date: 08/13/2017 Surgery type: Sleeve gastrectomy Start weight at [facility]: 408.7 lbs Weight today: 252.6 Weight loss goal: reduce pain, be able to walk, to do more, be able to do Zumba, wants to be around 200 lbs  TANITA  BODY COMP RESULTS  01/02/2018 04/04/2018 07/30/2018 10/29/2018   BMI (kg/m^2) 47.9 44.9 42.2 42   Fat Mass (lbs) 143.0 119.0 109.2 117.8   Fat Free Mass (lbs) 149.0 146.8 144.2 134.8   Total Body Water (lbs) 110.2 107.6 105.2 98.6    24-hr recall: sometimes 2 protein bars and 1 protein cookie B (AM): protein bar: [employer] sometimes with an apple Snk (AM): handful of cashews L (PM): protein shake with pork rinds or chicken with cheese  Snk (PM): sometimes protein cookie D (PM): 3-4 ounces of steak sometimes with a salad Snk (PM): fat bomb: 8 grams of fat keto bar  Fluid intake: water, kambucha:   Estimated total protein intake: 60+  Medications: see list Supplementation: bariatric advantage capsule and calcium   Using straws: no Drinking while eating: no Having you been chewing well: yes Chewing/swallowing difficulties: no Changes in vision: no Changes to mood/headaches: no Hair loss/Changes to skin/Changes to nails: no Any difficulty focusing or concentrating: no Sweating: no Dizziness/Lightheaded: no Palpitations: no  Carbonated beverages: no N/V/D/C/GAS: no Abdominal Pain: no Dumping syndrome: no  Recent physical activity:  2 days a week   Progress  Towards Goal(s):  In progress.   Nutritional Diagnosis:  [place]-3.3 Overweight/obesity related to past poor dietary habits and physical inactivity as evidenced by patient w/ recent sleeve surgery following dietary guidelines for continued weight loss.    Intervention:  Nutrition nutrition.  Due to the bodies need for essential vitamins, minerals, and fats the pt was educated on the need to consume a certain amount of calories as well as certain nutrients daily. Pt was educated on the need for daily physical activity and to reach a goal of at least 150 minutes of moderate to vigorous physical activity as directed by their physician due to such benefits as increased musculature and improved lab values.   Goals: -Walk 3 days a week every week  -Do 1 zumba class a week -Plan each week to find holes for walking and zumba -Aim to eat Non-starchy vegetables at least 4 times a week  For example: half a protein shake with cucumbers  Teaching Method Utilized:  Visual Auditory Hands on  Barriers to learning/adherence to lifestyle change: work  Demonstrated degree of understanding via:  Teach Back   Monitoring/Evaluation:  Dietary intake, exercise, and body weight. Follow up in April

## 2018-10-29 NOTE — Patient Instructions (Addendum)
-  Walk 3 days a week every week   -Do 1 zumba class a week  -Plan each week to find holes for walking and zumba  -Aim to eat Non-starchy vegetables at least 4 times a week  For example: half a protein shake with cucumbers

## 2018-10-31 MED FILL — BUMETANIDE 1 MG TABLET: 1 | 30 days supply | Qty: 30 | Fill #3

## 2018-10-31 MED FILL — PREMARIN 1.25 MG TABLET: 1.25 | 30 days supply | Qty: 30 | Fill #5

## 2018-11-07 DIAGNOSIS — Z6841 Body Mass Index (BMI) 40.0 and over, adult: Secondary | ICD-10-CM | POA: Diagnosis not present

## 2018-11-07 DIAGNOSIS — M150 Primary generalized (osteo)arthritis: Secondary | ICD-10-CM | POA: Diagnosis not present

## 2018-11-07 DIAGNOSIS — M0589 Other rheumatoid arthritis with rheumatoid factor of multiple sites: Secondary | ICD-10-CM | POA: Diagnosis not present

## 2018-11-07 DIAGNOSIS — Z79899 Other long term (current) drug therapy: Secondary | ICD-10-CM | POA: Diagnosis not present

## 2018-11-22 MED FILL — METHOTREXATE 25 MG/ML VIAL: 50 | 84 days supply | Qty: 10 | Fill #0

## 2018-11-26 MED FILL — HUMIRA CF 40 MG/0.4ML PSKT: 40 | 28 days supply | Qty: 2 | Fill #3

## 2018-11-29 MED FILL — BUMETANIDE 1 MG TABLET: 1 | 30 days supply | Qty: 30 | Fill #0

## 2018-11-29 MED FILL — PREMARIN 1.25 MG TABLET: 1.25 | 30 days supply | Qty: 30 | Fill #6

## 2018-12-18 DIAGNOSIS — Z2821 Immunization not carried out because of patient refusal: Secondary | ICD-10-CM | POA: Diagnosis not present

## 2018-12-18 DIAGNOSIS — Z0000 Encounter for general adult medical examination without abnormal findings: Secondary | ICD-10-CM | POA: Diagnosis not present

## 2018-12-18 DIAGNOSIS — Z1331 Encounter for screening for depression: Secondary | ICD-10-CM | POA: Diagnosis not present

## 2018-12-18 DIAGNOSIS — Z79899 Other long term (current) drug therapy: Secondary | ICD-10-CM | POA: Diagnosis not present

## 2018-12-18 DIAGNOSIS — Z6841 Body Mass Index (BMI) 40.0 and over, adult: Secondary | ICD-10-CM | POA: Diagnosis not present

## 2018-12-18 DIAGNOSIS — Z299 Encounter for prophylactic measures, unspecified: Secondary | ICD-10-CM | POA: Diagnosis not present

## 2018-12-18 DIAGNOSIS — Z1211 Encounter for screening for malignant neoplasm of colon: Secondary | ICD-10-CM | POA: Diagnosis not present

## 2018-12-23 MED FILL — HUMIRA CF 40 MG/0.4ML PSKT: 40 | 28 days supply | Qty: 2 | Fill #4 | Status: TO

## 2018-12-30 MED FILL — FOLIC ACID 1 MG TABS: 1 | 30 days supply | Qty: 60 | Fill #0 | Status: TO

## 2018-12-30 MED FILL — BUMETANIDE 1 MG TABLET: 1 | 30 days supply | Qty: 30 | Fill #1 | Status: TO

## 2018-12-30 MED FILL — PREMARIN 1.25 MG TABLET: 1.25 | 30 days supply | Qty: 30 | Fill #7 | Status: TO

## 2019-01-20 MED FILL — HUMIRA CF 40 MG/0.4ML PSKT: 40 | 28 days supply | Qty: 2 | Fill #0

## 2019-01-20 MED FILL — BUMETANIDE 1 MG TABS: 1 | 30 days supply | Qty: 30 | Fill #0

## 2019-01-20 MED FILL — FOLIC ACID 1 MG TABLET: 1 | 30 days supply | Qty: 60 | Fill #0

## 2019-01-20 MED FILL — PREMARIN 1.25 MG TABLET: 1.25 | 30 days supply | Qty: 30 | Fill #0

## 2019-01-28 ENCOUNTER — Telehealth: Payer: Self-pay | Admitting: Pharmacist

## 2019-01-28 NOTE — Telephone Encounter (Signed)
Called patient to schedule an appointment for the [facility]. Patient was busy with a client but will call back this afternoon to schedule an appointment.

## 2019-01-30 ENCOUNTER — Ambulatory Visit: Payer: 59 | Attending: Family Medicine | Admitting: Pharmacist

## 2019-01-30 ENCOUNTER — Other Ambulatory Visit: Payer: Self-pay

## 2019-01-30 ENCOUNTER — Encounter: Payer: Self-pay | Admitting: Pharmacist

## 2019-01-30 DIAGNOSIS — Z79899 Other long term (current) drug therapy: Secondary | ICD-10-CM

## 2019-01-30 NOTE — Progress Notes (Signed)
  S: Patient presents for review of their specialty medication therapy. Patient is currently taking Humira for rheumatoid arthritis. Patient is managed by Dr. Amil Amen for this.  Adherence: confirms. Took this week's dose last night.   Efficacy: reports "doing great" - sometimes gets fatigued day after injection.  Dosing:  Rheumatoid arthritis: SubQ: 40 mg every other week (may continue methotrexate, other nonbiologic DMARDS, corticosteroids, NSAIDs, and/or analgesics)  Dose adjustments: Renal: no dose adjustments (has not been studied) Hepatic: no dose adjustments (has not been studied)  Drug-drug interactions: taking MTX as well; no significant DDIs identified  Screening: TB test: completed per pt Hepatitis: competed per pt (last LFT slightly elevated - has appt with rheumatologist next week)  Monitoring: S/sx of infection: denies  CBC: see below (has appt with rheumatologist next week) S/sx of hypersensitivity: denies  S/sx of malignancy: denies  S/sx of heart failure: denies    Other side effects: none   O:  Lab Results  Component Value Date   WBC 7.7 08/14/2017   HGB 11.9 (L) 08/14/2017   HCT 36.8 08/14/2017   MCV 91.8 08/14/2017   PLT 217 08/14/2017     Chemistry      Component Value Date/Time   NA 141 08/14/2017 0550   K 4.5 08/14/2017 0550   CL 106 08/14/2017 0550   CO2 27 08/14/2017 0550   BUN 9 08/14/2017 0550   CREATININE 0.63 08/14/2017 0550      Component Value Date/Time   CALCIUM 8.9 08/14/2017 0550   ALKPHOS 46 08/14/2017 0550   AST 45 (H) 08/14/2017 0550   ALT 68 (H) 08/14/2017 0550   BILITOT 0.9 08/14/2017 0550     A/P: 1. Medication review: Patient currently on Humira for RA. Reviewed the medication with the patient, including the following: Humira is a TNF blocking agent indicated for ankylosing spondylitis, Crohn's disease, Hidradenitis suppurativa, psoriatic arthritis, plaque psoriasis, ulcerative colitis, and uveitis. Patient educated on  purpose, proper use and potential adverse effects of Humira. Possible adverse effects are increased risk of infections, headache, and injection site reactions. There is the possibility of an increased risk of malignancy but it is not well understood if this increased risk is due to there medication or the disease state. No recommendations for any changes at this time. Pt encouraged to keep upcoming appointment with Dr. Amil Amen next week.   Benard Halsted, PharmD, [place]  [phone]

## 2019-02-05 DIAGNOSIS — M150 Primary generalized (osteo)arthritis: Secondary | ICD-10-CM | POA: Diagnosis not present

## 2019-02-05 DIAGNOSIS — M0589 Other rheumatoid arthritis with rheumatoid factor of multiple sites: Secondary | ICD-10-CM | POA: Diagnosis not present

## 2019-02-05 DIAGNOSIS — Z79899 Other long term (current) drug therapy: Secondary | ICD-10-CM | POA: Diagnosis not present

## 2019-02-26 MED FILL — PREMARIN 1.25 MG TABLET: 1.25 | 30 days supply | Qty: 30 | Fill #1

## 2019-02-26 MED FILL — FOLIC ACID 1 MG TABS: 1 | 30 days supply | Qty: 60 | Fill #1

## 2019-02-26 MED FILL — METHOTREXATE 25 MG/ML VIAL: 50 | 84 days supply | Qty: 10 | Fill #0

## 2019-02-26 MED FILL — BUMETANIDE 1 MG TABLET: 1 | 30 days supply | Qty: 30 | Fill #1

## 2019-02-27 ENCOUNTER — Other Ambulatory Visit: Payer: Self-pay | Admitting: Pharmacist

## 2019-02-27 MED ORDER — ADALIMUMAB 40 MG/0.4ML ~~LOC~~ PSKT: 0.4000 mL | PREFILLED_SYRINGE | SUBCUTANEOUS | 5 refills | Status: DC

## 2019-03-13 MED FILL — HUMIRA CF 40 MG/0.4ML PSKT: 40 | 28 days supply | Qty: 2 | Fill #0

## 2019-04-04 MED FILL — FOLIC ACID 1 MG TABS: 1 | 30 days supply | Qty: 60 | Fill #2

## 2019-04-04 MED FILL — PREMARIN 1.25 MG TABLET: 1.25 | 30 days supply | Qty: 30 | Fill #2

## 2019-04-07 MED FILL — BUMETANIDE 1 MG TABLET: 1 | 30 days supply | Qty: 30 | Fill #0

## 2019-04-15 MED FILL — HUMIRA CF 40 MG/0.4ML PSKT: 40 | 28 days supply | Qty: 2 | Fill #1

## 2019-05-02 ENCOUNTER — Other Ambulatory Visit: Payer: Self-pay | Admitting: Certified Nurse Midwife

## 2019-05-02 DIAGNOSIS — N951 Menopausal and female climacteric states: Secondary | ICD-10-CM

## 2019-05-02 MED FILL — PREMARIN 1.25 MG TABLET: 1.25 | 30 days supply | Qty: 30 | Fill #0

## 2019-05-02 MED FILL — BUMETANIDE 1 MG TABLET: 1 | 30 days supply | Qty: 30 | Fill #1

## 2019-05-02 MED FILL — FOLIC ACID 1 MG TABS: 1 | 30 days supply | Qty: 60 | Fill #0

## 2019-05-02 NOTE — Telephone Encounter (Signed)
Medication refill request: Premarin Last AEX:  05/07/18 Next AEX: 05/14/19 Last MMG (if hormonal medication request): 05/07/18 Bi-rads 2 benign  Refill authorized:  # 30 with 0 RF

## 2019-05-07 DIAGNOSIS — Z79899 Other long term (current) drug therapy: Secondary | ICD-10-CM | POA: Diagnosis not present

## 2019-05-07 DIAGNOSIS — Z6841 Body Mass Index (BMI) 40.0 and over, adult: Secondary | ICD-10-CM | POA: Diagnosis not present

## 2019-05-07 DIAGNOSIS — M150 Primary generalized (osteo)arthritis: Secondary | ICD-10-CM | POA: Diagnosis not present

## 2019-05-07 DIAGNOSIS — M0589 Other rheumatoid arthritis with rheumatoid factor of multiple sites: Secondary | ICD-10-CM | POA: Diagnosis not present

## 2019-05-13 ENCOUNTER — Other Ambulatory Visit: Payer: Self-pay

## 2019-05-14 ENCOUNTER — Ambulatory Visit (INDEPENDENT_AMBULATORY_CARE_PROVIDER_SITE_OTHER): Payer: 59 | Admitting: Certified Nurse Midwife

## 2019-05-14 ENCOUNTER — Encounter: Payer: Self-pay | Admitting: Certified Nurse Midwife

## 2019-05-14 ENCOUNTER — Other Ambulatory Visit: Payer: Self-pay

## 2019-05-14 VITALS — BP 112/70 | HR 68 | Temp 97.1°F | Resp 16 | Ht 64.75 in | Wt 267.0 lb

## 2019-05-14 DIAGNOSIS — Z23 Encounter for immunization: Secondary | ICD-10-CM | POA: Diagnosis not present

## 2019-05-14 DIAGNOSIS — Z01419 Encounter for gynecological examination (general) (routine) without abnormal findings: Secondary | ICD-10-CM

## 2019-05-14 DIAGNOSIS — N951 Menopausal and female climacteric states: Secondary | ICD-10-CM | POA: Diagnosis not present

## 2019-05-14 MED ORDER — ESTROGENS CONJUGATED 1.25 MG PO TABS: 1.2500 mg | ORAL_TABLET | Freq: Every day | ORAL | 3 refills | Status: DC

## 2019-05-14 NOTE — Progress Notes (Addendum)
53 y.o. [id number] Married  Caucasian Fe here for annual exam. Menopausal on [name]. No night sweats. Sees PCP for aex, labs, all normal. Has lost a total 137 pounds to date and feeling better. Continues with bariatric care as needed. Has had two falls without serious injury. Has been walking for exercise when possible and balance is improving. Considering stopping [name]. Feels like she has no menopausal symptoms now. No other health issues today.  Patient's last menstrual period was 04/25/2007.          Sexually active: Yes.    The current method of family planning is status post hysterectomy.    Exercising: Yes.    gardening Smoker:  no  Review of Systems  Constitutional: Negative.   HENT: Negative.   Eyes: Negative.   Respiratory: Negative.   Cardiovascular: Negative.   Gastrointestinal: Negative.   Genitourinary: Negative.   Musculoskeletal: Negative.   Skin: Negative.   Neurological: Negative.   Endo/Heme/Allergies: Negative.   Psychiatric/Behavioral: Negative.     Health Maintenance: Pap:  03-22-12 neg History of Abnormal Pap: yrs ago MMG:  05-07-18 category b density birads 2:neg, scheduled for monday Self Breast exams: no Colonoscopy:  2016 polyps f/u 38yrs per patient BMD:   none TDaP:  2010 Shingles: no Pneumonia: no Hep C and HIV: HIV neg yrs ago Labs: PCP   reports that she has quit smoking. She has never used smokeless tobacco. She reports current drug use. Drug: Marijuana. She reports that she does not drink alcohol.  Past Medical History:  Diagnosis Date  . Abnormal Pap smear of cervix    yrs ago  . Condyloma   . Morbid obesity ([place])   . Pneumonia    hx of   . Rheumatoid arthritis ([place]) 2005  . STD (sexually transmitted disease)    + trich  . Tubal ectopic pregnancy    times 2    Past Surgical History:  Procedure Laterality Date  . ANKLE SURGERY    . CHOLECYSTECTOMY  04/25/07  . COLONOSCOPY N/A 03/24/2015   JAS:NKNLZ external hemorrhoids/moderate sized  internal hemorrhoids/moderate diverticulosis in the sigmoid colon  . HEMORRHOID BANDING N/A 03/24/2015   Procedure: HEMORRHOID BANDING;  Surgeon: Danie Binder, MD;  Location: [facility];  Service: Endoscopy;  Laterality: N/A;  . LAPAROSCOPIC GASTRIC SLEEVE RESECTION N/A 08/13/2017   Procedure: LAPAROSCOPIC GASTRIC SLEEVE RESECTION, UPPER ENDOSCOPY HIATAL HERNIA REPAIR;  Surgeon: Kinsinger, [name], MD;  Location: [facility];  Service: General;  Laterality: N/A;  . TOTAL ABDOMINAL HYSTERECTOMY  04/25/07   BSO    Current Outpatient Medications  Medication Sig Dispense Refill  . Adalimumab (HUMIRA) 40 MG/0.4ML PSKT Inject 0.4 mLs into the skin every 14 (fourteen) days. (every other week) 2 each 5  . bumetanide (BUMEX) 1 MG tablet Take 1 tablet by mouth daily as needed (fluid). occ    . cetirizine (ZYRTEC) 10 MG tablet Take 10 mg by mouth daily.    . folic acid (FOLVITE) 1 MG tablet Take 2 mg by mouth daily.     . methotrexate 50 MG/2ML injection INJECT 0.8 MLS ONCE WEEKLY AS DIRECTED  1  . PREMARIN 1.25 MG tablet TAKE 1 TABLET BY MOUTH DAILY. 30 tablet 0  . UNABLE TO FIND daily. Bariatric multivitamin    . UNABLE TO FIND Bariatric calcium     No current facility-administered medications for this visit.     Family History  Problem Relation Age of Onset  . Cancer Father   .  Cirrhosis Father   . Colon cancer Neg Hx     ROS:  Pertinent items are noted in HPI.  Otherwise, a comprehensive ROS was negative.  Exam:   BP 112/70   Pulse 68   Temp (!) 97.1 F (36.2 C) (Skin)   Resp 16   Ht 5' 4.75" (1.645 m)   Wt 267 lb (121.1 kg)   LMP 04/25/2007   BMI 44.77 kg/m  Height: 5' 4.75" (164.5 cm) Ht Readings from Last 3 Encounters:  05/14/19 5' 4.75" (1.645 m)  10/29/18 5\' 5"  (1.651 m)  08/01/18 5\' 5"  (1.651 m)    General appearance: alert, cooperative and appears stated age Head: Normocephalic, without obvious abnormality, atraumatic Neck: no adenopathy, supple, symmetrical,  trachea midline and thyroid normal to inspection and palpation Lungs: clear to auscultation bilaterally Breasts: normal appearance, no masses or tenderness, No nipple retraction or dimpling, No nipple discharge or bleeding, No axillary or supraclavicular adenopathy Heart: regular rate and rhythm Abdomen: soft, non-tender; no masses,  no organomegaly Extremities: extremities normal, atraumatic, no cyanosis or edema Skin: Skin color, texture, turgor normal. No rashes or lesions Lymph nodes: Cervical, supraclavicular, and axillary nodes normal. No abnormal inguinal nodes palpated Neurologic: Grossly normal   Pelvic: External genitalia:  no lesions              Urethra:  normal appearing urethra with no masses, tenderness or lesions              Bartholin's and Skene's: normal                 Vagina: normal appearing vagina with normal color and discharge, no lesions              Cervix: absent              Pap taken: No. Bimanual Exam:  Uterus:  uterus absent              Adnexa: no mass, fullness, tenderness and ovaries surgically absent bilateral               Rectovaginal: Confirms               Anus:  normal sphincter tone, no lesions  Chaperone present: yes  A:  Well Woman with normal exam  Menopausal on [name] S/P TAH with BSO  Bariatric weight loss 137 pounds, still working with diet.  PCP management of labs and aex.  See Rheumatology for Humira and methotrexate management.  Immunization due  P:   Reviewed health and wellness pertinent to exam  Discussed risks/benefits/warning signs of [name]. Desires continuance at this point, may stop and will advise when she does.  Rx Premarin 1.25 mg see order with instructions  Continue MD follow up as recommended.  Requests TDAP  Pap smear: no   counseled on breast self exam, mammography screening, feminine hygiene, adequate intake of calcium and vitamin D, diet and exercise  return annually or prn  An After Visit Summary was printed  and given to the patient.  Note patient stopped [name] on 06/09/2019 per message

## 2019-05-19 ENCOUNTER — Encounter: Payer: Self-pay | Admitting: Certified Nurse Midwife

## 2019-05-19 DIAGNOSIS — Z1231 Encounter for screening mammogram for malignant neoplasm of breast: Secondary | ICD-10-CM | POA: Diagnosis not present

## 2019-05-22 MED FILL — HUMIRA CF 40 MG/0.4ML PSKT: 40 | 28 days supply | Qty: 2 | Fill #2

## 2019-06-17 ENCOUNTER — Telehealth: Payer: Self-pay | Admitting: Certified Nurse Midwife

## 2019-06-17 NOTE — Telephone Encounter (Signed)
Patient seen in office on 05/14/19 for AEX, discussed stopping Premarin, was to advise once she has stopped.   MyChart message to patient.   Routing to [employer], CNM FYI.

## 2019-06-17 NOTE — Telephone Encounter (Signed)
Patient sent the following message through [place]. Routing to triage to assist patient with request.  Hi! You had told me to let you know if/when I stop taking the Premarin so just wanted to let you know that I stopped it LAST Sunday the 23rd of August. Sorry it took me a minute to message you. Have a great week!   Sarah Dean

## 2019-06-17 NOTE — Telephone Encounter (Signed)
Noted in last chart note

## 2019-06-18 MED FILL — BUMETANIDE 1 MG TABS: 1 | 30 days supply | Qty: 30 | Fill #2

## 2019-06-18 MED FILL — FOLIC ACID 1 MG TABS: 1 | 30 days supply | Qty: 60 | Fill #1

## 2019-06-18 MED FILL — HUMIRA CF 40 MG/0.4ML PSKT: 40 | 28 days supply | Qty: 2 | Fill #3

## 2019-06-18 NOTE — Telephone Encounter (Signed)
Encounter closed

## 2019-07-22 ENCOUNTER — Telehealth: Payer: 59 | Admitting: Physician Assistant

## 2019-07-22 DIAGNOSIS — L309 Dermatitis, unspecified: Secondary | ICD-10-CM | POA: Diagnosis not present

## 2019-07-22 MED ORDER — PREDNISONE 10 MG PO TABS: ORAL_TABLET | ORAL | 0 refills | Status: DC

## 2019-07-22 NOTE — Progress Notes (Signed)
We are sorry that you are not feeing well.  Here is how we plan to help!  Based on what you have shared with me it seems like you may be having an allergic reaction to the oily resin from a group of plants.  This resin is very sticky, so it easily attaches to your skin, clothing, tools equipment, and pet's fur.    This blistering rash is often called poison ivy rash although it can come from contact with the leaves, stems and roots of poison ivy, poison oak and poison sumac.  The oily resin contains urushiol (u-ROO-she-ol) that produces a skin rash on exposed skin.  The severity of the rash depends on the amount of urushiol that gets on your skin.  A section of skin with more urushiol on it may develop a rash sooner.  The rash usually develops 12-48 hours after exposure and can last two to three weeks.  Your skin must come in direct contact with the plant's oil to be affected.  Blister fluid doesn't spread the rash.  However, if you come into contact with a piece of clothing or pet fur that has urushiol on it, the rash may spread out.  You can also transfer the oil to other parts of your body with your fingers.  Often the rash looks like a straight line because of the way the plant brushes against your skin.    I have developed the following plan to treat your condition Since your rash is widespread or has resulted in a large number of blisters, I have prescribed an oral corticosteroid.  Please follow these recommendations:  I have sent a prednisone dose pack to your chosen pharmacy. Be sure to follow the instructions carefully and complete the entire prescription. You may use Benadryl or Caladryl topical lotions to sooth the itch and remember cool, not hot, showers and baths can help relieve the itching!  Place cool, wet compresses on the affected area for 15-30 minutes several times a day.  You may also take oral antihistamines, such as diphenhydramine (Benadryl, others), which may also help you sleep  better.  Watch your skin for any purulent (pus) drainage or red streaking from the site.  If this occurs, contact your provider.  You may require an antibiotic for a skin infection.  Make sure that the clothes you were wearing as well as any towels or sheets that may have come in contact with the oil (urushiol) are washed in detergent and hot water.         It is very important that if the rash spreads near/in your eyes, if you have any changes in vision, of if you have fevers then you will need to have a face to face evaluation with a medical provider.  What can you do to prevent this rash?  Avoid the plants.  Learn how to identify poison ivy, poison oak and poison sumac in all seasons.  When hiking or engaging in other activities that might expose you to these plants, try to stay on cleared pathways.  If camping, make sure you pitch your tent in an area free of these plants.  Keep pets from running through wooded areas so that urushiol doesn't accidentally stick to their fur, which you may touch.  Remove or kill the plants.  In your yard, you can get rid of poison ivy by applying an herbicide or pulling it out of the ground, including the roots, while wearing heavy gloves.  Afterward remove  the gloves and thoroughly wash them and your hands.  Don't burn poison ivy or related plants because the urushiol can be carried by smoke.  Wear protective clothing.  If needed, protect your skin by wearing socks, boots, pants, long sleeves and vinyl gloves.  Wash your skin right away.  Washing off the oil with soap and water within 30 minutes of exposure may reduce your chances of getting a poison ivy rash.  Even washing after an hour or so can help reduce the severity of the rash.  If you walk through some poison ivy and then later touch your shoes, you may get some urushiol on your hands, which may then transfer to your face or body by touching or rubbing.  If the contaminated object isn't cleaned, the urushiol  on it can still cause a skin reaction years later.    Be careful not to reuse towels after you have washed your skin.  Also carefully wash clothing in detergent and hot water to remove all traces of the oil.  Handle contaminated clothing carefully so you don't transfer the urushiol to yourself, furniture, rugs or appliances.  Remember that pets can carry the oil on their fur and paws.  If you think your pet may be contaminated with urushiol, put on some long rubber gloves and give your pet a bath.  Finally, be careful not to burn these plants as the smoke can contain traces of the oil.  Inhaling the smoke may result in difficulty breathing. If that occurred you should see a physician as soon as possible.  See your doctor right away if:   The reaction is severe or widespread  You inhaled the smoke from burning poison ivy and are having difficulty breathing  Your skin continues to swell  The rash affects your eyes, mouth or genitals  Blisters are oozing pus  You develop a fever greater than 100 F (37.8 C)  The rash doesn't get better within a few weeks.  If you scratch the poison ivy rash, bacteria under your fingernails may cause the skin to become infected.  See your doctor if pus starts oozing from the blisters.  Treatment generally includes antibiotics.  Poison ivy treatments are usually limited to self-care methods.  And the rash typically goes away on its own in two to three weeks.     If the rash is widespread or results in a large number of blisters, your doctor may prescribe an oral corticosteroid, such as prednisone.  If a bacterial infection has developed at the rash site, your doctor may give you a prescription for an oral antibiotic.  MAKE SURE YOU   Understand these instructions.  Will watch your condition.  Will get help right away if you are not doing well or get worse.  Thank you for choosing an e-visit. Your e-visit answers were reviewed by a board certified  advanced clinical practitioner to complete your personal care plan. Depending upon the condition, your plan could have included both over the counter or prescription medications.  Please review your pharmacy choice. If there is a problem you may use MyChart messaging to have the prescription routed to another pharmacy.   Your safety is important to [place]. If you have drug allergies check your prescription carefully.  You can use MyChart to ask questions about today's visit, request a non-urgent call back, or ask for a work or school excuse for 24 hours related to this e-Visit. If it has been greater than 24 hours  you will need to follow up with your provider, or enter a new e-Visit to address those concerns.   You will get an email in the next two days asking about your experience. I hope that your e-visit has been valuable and will speed your recovery Thank you for choosing an e-visit.    Approximately 5 minutes was spent documenting and reviewing patient's chart.

## 2019-07-30 MED FILL — BUMETANIDE 1 MG TABS: 1 | 30 days supply | Qty: 30 | Fill #3

## 2019-07-30 MED FILL — METHOTREXATE SODIUM (PF) 50: 50 | 70 days supply | Qty: 20 | Fill #0

## 2019-07-30 MED FILL — FOLIC ACID 1 MG TABS: 1 | 30 days supply | Qty: 60 | Fill #2

## 2019-08-01 MED FILL — HUMIRA CF 40 MG/0.4ML PSKT: 40 | 28 days supply | Qty: 2 | Fill #4

## 2019-08-20 DIAGNOSIS — M0589 Other rheumatoid arthritis with rheumatoid factor of multiple sites: Secondary | ICD-10-CM | POA: Diagnosis not present

## 2019-08-20 DIAGNOSIS — Z6841 Body Mass Index (BMI) 40.0 and over, adult: Secondary | ICD-10-CM | POA: Diagnosis not present

## 2019-08-20 DIAGNOSIS — M150 Primary generalized (osteo)arthritis: Secondary | ICD-10-CM | POA: Diagnosis not present

## 2019-08-20 DIAGNOSIS — Z79899 Other long term (current) drug therapy: Secondary | ICD-10-CM | POA: Diagnosis not present

## 2019-08-29 MED FILL — BUMETANIDE 1 MG TABS: 1 | 30 days supply | Qty: 30 | Fill #0

## 2019-08-29 MED FILL — FOLIC ACID 1 MG TABS: 1 | 30 days supply | Qty: 60 | Fill #0

## 2019-09-03 MED FILL — HUMIRA CF 40 MG/0.4ML PSKT: 40 | 28 days supply | Qty: 2 | Fill #5

## 2019-09-29 MED FILL — BUMETANIDE 1 MG TABS: 1 | 30 days supply | Qty: 30 | Fill #1

## 2019-09-29 MED FILL — FOLIC ACID 1 MG TABS: 1 | 30 days supply | Qty: 60 | Fill #1

## 2019-10-01 DIAGNOSIS — E669 Obesity, unspecified: Secondary | ICD-10-CM | POA: Diagnosis not present

## 2019-10-01 DIAGNOSIS — Z9884 Bariatric surgery status: Secondary | ICD-10-CM | POA: Diagnosis not present

## 2019-10-07 ENCOUNTER — Other Ambulatory Visit: Payer: Self-pay | Admitting: Pharmacist

## 2019-10-07 MED ORDER — HUMIRA (2 SYRINGE) 40 MG/0.4ML ~~LOC~~ PSKT: 0.4000 mL | PREFILLED_SYRINGE | SUBCUTANEOUS | 4 refills | Status: DC

## 2019-10-17 DIAGNOSIS — M7551 Bursitis of right shoulder: Secondary | ICD-10-CM

## 2019-10-17 HISTORY — DX: Bursitis of right shoulder: M75.51

## 2019-10-20 MED FILL — HUMIRA CF 40 MG/0.4ML PSKT: 40 | 28 days supply | Qty: 2 | Fill #0

## 2019-11-09 MED FILL — FOLIC ACID 1 MG TABS: 1 | 30 days supply | Qty: 60 | Fill #2

## 2019-11-10 MED FILL — BUMETANIDE 1 MG TABS: 1 | 30 days supply | Qty: 30 | Fill #2

## 2019-11-20 DIAGNOSIS — M0589 Other rheumatoid arthritis with rheumatoid factor of multiple sites: Secondary | ICD-10-CM | POA: Diagnosis not present

## 2019-12-03 MED FILL — FOLIC ACID 1 MG TABS: 1 | 30 days supply | Qty: 60 | Fill #3

## 2019-12-03 MED FILL — HUMIRA CF 40 MG/0.4ML PSKT: 40 | 28 days supply | Qty: 2 | Fill #1

## 2019-12-04 MED FILL — BUMETANIDE 1 MG TABS: 1 | 30 days supply | Qty: 30 | Fill #3

## 2019-12-09 DIAGNOSIS — Z299 Encounter for prophylactic measures, unspecified: Secondary | ICD-10-CM | POA: Diagnosis not present

## 2019-12-09 DIAGNOSIS — R591 Generalized enlarged lymph nodes: Secondary | ICD-10-CM | POA: Diagnosis not present

## 2019-12-09 DIAGNOSIS — M069 Rheumatoid arthritis, unspecified: Secondary | ICD-10-CM | POA: Diagnosis not present

## 2019-12-09 DIAGNOSIS — Z6841 Body Mass Index (BMI) 40.0 and over, adult: Secondary | ICD-10-CM | POA: Diagnosis not present

## 2019-12-09 DIAGNOSIS — D224 Melanocytic nevi of scalp and neck: Secondary | ICD-10-CM | POA: Diagnosis not present

## 2019-12-16 DIAGNOSIS — L821 Other seborrheic keratosis: Secondary | ICD-10-CM | POA: Diagnosis not present

## 2019-12-16 DIAGNOSIS — D485 Neoplasm of uncertain behavior of skin: Secondary | ICD-10-CM | POA: Diagnosis not present

## 2019-12-19 DIAGNOSIS — Z1331 Encounter for screening for depression: Secondary | ICD-10-CM | POA: Diagnosis not present

## 2019-12-19 DIAGNOSIS — Z2821 Immunization not carried out because of patient refusal: Secondary | ICD-10-CM | POA: Diagnosis not present

## 2019-12-19 DIAGNOSIS — Z6841 Body Mass Index (BMI) 40.0 and over, adult: Secondary | ICD-10-CM | POA: Diagnosis not present

## 2019-12-19 DIAGNOSIS — Z0000 Encounter for general adult medical examination without abnormal findings: Secondary | ICD-10-CM | POA: Diagnosis not present

## 2019-12-19 DIAGNOSIS — Z1211 Encounter for screening for malignant neoplasm of colon: Secondary | ICD-10-CM | POA: Diagnosis not present

## 2019-12-19 DIAGNOSIS — M069 Rheumatoid arthritis, unspecified: Secondary | ICD-10-CM | POA: Diagnosis not present

## 2019-12-19 DIAGNOSIS — Z299 Encounter for prophylactic measures, unspecified: Secondary | ICD-10-CM | POA: Diagnosis not present

## 2019-12-19 DIAGNOSIS — Z789 Other specified health status: Secondary | ICD-10-CM | POA: Diagnosis not present

## 2019-12-19 DIAGNOSIS — Z79899 Other long term (current) drug therapy: Secondary | ICD-10-CM | POA: Diagnosis not present

## 2019-12-27 MED FILL — FOLIC ACID 1 MG TABS: 1 | 30 days supply | Qty: 60 | Fill #4

## 2019-12-29 MED FILL — BUMETANIDE 1 MG TABS: 1 | 30 days supply | Qty: 30 | Fill #0

## 2020-01-05 ENCOUNTER — Ambulatory Visit (INDEPENDENT_AMBULATORY_CARE_PROVIDER_SITE_OTHER): Payer: 59 | Admitting: Orthopedic Surgery

## 2020-01-05 ENCOUNTER — Other Ambulatory Visit: Payer: Self-pay

## 2020-01-05 ENCOUNTER — Ambulatory Visit: Payer: 59

## 2020-01-05 ENCOUNTER — Encounter: Payer: Self-pay | Admitting: Certified Nurse Midwife

## 2020-01-05 VITALS — BP 110/76 | HR 79 | Temp 97.9°F | Ht 65.0 in | Wt 295.0 lb

## 2020-01-05 DIAGNOSIS — M7541 Impingement syndrome of right shoulder: Secondary | ICD-10-CM | POA: Diagnosis not present

## 2020-01-05 DIAGNOSIS — G8929 Other chronic pain: Secondary | ICD-10-CM | POA: Diagnosis not present

## 2020-01-05 DIAGNOSIS — M25511 Pain in right shoulder: Secondary | ICD-10-CM

## 2020-01-05 DIAGNOSIS — S63591A Other specified sprain of right wrist, initial encounter: Secondary | ICD-10-CM | POA: Diagnosis not present

## 2020-01-05 NOTE — Progress Notes (Addendum)
Chief Complaint  Patient presents with  . Arm Pain    Right arm pain.    History 54 year old female with right shoulder pain from an injury 8 to 10 years ago but recently worsening over the last year primarily posterior part of the upper arm associated with painful range of motion in abduction and flexion  Prior treatment topical medications and herbal medications which worked initially but are no longer working  She also has complaint of giving out right wrist and ulnar-sided right wrist pain times a year  Review of systems negative except for lymphedema  Past Medical History:  Diagnosis Date  . Abnormal Pap smear of cervix    yrs ago  . Condyloma   . Morbid obesity ([place])   . Pneumonia    hx of   . Rheumatoid arthritis ([place]) 2005  . STD (sexually transmitted disease)    + trich  . Tubal ectopic pregnancy    times 2  =BP 110/76   Pulse 79   Temp 97.9 F (36.6 C)   Ht 5\' 5"  (1.651 m)   Wt 295 lb (133.8 kg)   LMP 04/25/2007   BMI 49.09 kg/m   The patient meets the AMA guidelines for Morbid (severe) obesity with a BMI > 40.0 and I have recommended weight loss.  Physical Exam Vitals and nursing note reviewed.  Constitutional:      General: She is not in acute distress.    Appearance: Normal appearance. She is obese. She is not ill-appearing.  Neurological:     Mental Status: She is alert and oriented to person, place, and time.  Psychiatric:        Mood and Affect: Mood normal.    Left shoulder skin is normal no tenderness full range of motion no instability muscle tone is normal  Lymph nodes are negative  Right shoulder skin is normal tenderness posterior subacromial space full passive range of motion no instability normal muscle strength and tone lymph nodes negative  Impingement sign positive  X-ray shows normal glenohumeral joint normal humeral head contour normal joint space  Impression bursitis tendinitis impingement syndrome Encounter Diagnoses  Name  Primary?  . Chronic right shoulder pain Yes  . Impingement syndrome of right shoulder   . Complex tear of triangular fibrocartilage of right wrist, initial encounter     Recommend subacromial injection, patient cannot take NSAIDs due to stomach surgery.  She is agreeable to a subacromial injection As well as home exercises And continue topical medications Return as needed   Procedure note the subacromial injection shoulder RIGHT    Verbal consent was obtained to inject the  RIGHT   Shoulder  Timeout was completed to confirm the injection site is a subacromial space of the  RIGHT  shoulder   Medication used Depo-Medrol 40 mg and lidocaine 1% 3 cc  Anesthesia was provided by ethyl chloride  The injection was performed in the RIGHT  posterior subacromial space. After pinning the skin with alcohol and anesthetized the skin with ethyl chloride the subacromial space was injected using a 20-gauge needle. There were no complications  Sterile dressing was applied.  Addendum right wrist tenderness over the ulnar side of the joint no evidence of tenderness in the first extensor compartment negative Finkelstein's test tenderness on the dorsum of the wrist as well near the TFCC  Impression TFCC degeneration or inflammation  Bracing

## 2020-01-05 NOTE — Patient Instructions (Addendum)
These are the muscle and arthrits creams I recommend:  PLEASE READ THE PACKAGE INSTRUCTIONS BEFORE USING   Ben Gay arthritis cream  Icy hot vanishing gel  Aspercreme odor free  Myoflex Oderless pain reliever  Capzasin  Sportscreme  Max freeze   Exercises    You have received an injection of steroids into the joint. 15% of patients will have increased pain within the 24 hours postinjection.   This is transient and will go away.   We recommend that you use ice packs on the injection site for 20 minutes every 2 hours and extra strength Tylenol 2 tablets every 8 as needed until the pain resolves.  If you continue to have pain after taking the Tylenol and using the ice please call the office for further instructions.   Shoulder Range of Motion Exercises Shoulder range of motion (ROM) exercises are done to keep the shoulder moving freely or to increase movement. They are often recommended for people who have shoulder pain or stiffness or who are recovering from a shoulder surgery. Phase 1 exercises When you are able, do this exercise 1-2 times per day for 30-60 seconds in each direction, or as directed by your health care provider. Pendulum exercise To do this exercise while sitting: 1. Sit in a chair or at the edge of your bed with your feet flat on the floor. 2. Let your affected arm hang down in front of you over the edge of the bed or chair. 3. Relax your shoulder, arm, and hand. [place] your body so your arm gently swings in small circles. You can also use your unaffected arm to start the motion. 5. Repeat changing the direction of the circles, swinging your arm left and right, and swinging your arm forward and back. To do this exercise while standing: 1. Stand next to a sturdy chair or table, and hold on to it with your hand on your unaffected side. 2. Bend forward at the waist. 3. Bend your knees slightly. 4. Relax your shoulder, arm, and hand. 5. While keeping your  shoulder relaxed, use body motion to swing your arm in small circles. 6. Repeat changing the direction of the circles, swinging your arm left and right, and swinging your arm forward and back. 7. Between exercises, stand up tall and take a short break to relax your lower back.  Phase 2 exercises Do these exercises 1-2 times per day or as told by your health care provider. Hold each stretch for 30 seconds, and repeat 3 times. Do the exercises with one or both arms as instructed by your health care provider. For these exercises, sit at a table with your hand and arm supported by the table. A chair that slides easily or has wheels can be helpful. External rotation 1. Turn your chair so that your affected side is nearest to the table. 2. Place your forearm on the table to your side. Bend your elbow about 90 at the elbow (right angle) and place your hand palm facing down on the table. Your elbow should be about 6 inches away from your side. 3. Keeping your arm on the table, lean your body forward. Abduction 1. Turn your chair so that your affected side is nearest to the table. 2. Place your forearm and hand on the table so that your thumb points toward the ceiling and your arm is straight out to your side. 3. Slide your hand out to the side and away from you, using your unaffected  arm to do the work. 4. To increase the stretch, you can slide your chair away from the table. Flexion: forward stretch 1. Sit facing the table. Place your hand and elbow on the table in front of you. 2. Slide your hand forward and away from you, using your unaffected arm to do the work. 3. To increase the stretch, you can slide your chair backward. Phase 3 exercises Do these exercises 1-2 times per day or as told by your health care provider. Hold each stretch for 30 seconds, and repeat 3 times. Do the exercises with one or both arms as instructed by your health care provider. Cross-body stretch: posterior capsule  stretch 1. Lift your arm straight out in front of you. 2. Bend your arm 90 at the elbow (right angle) so your forearm moves across your body. 3. Use your other arm to gently pull the elbow across your body, toward your other shoulder. Wall climbs 1. Stand with your affected arm extended out to the side with your hand resting on a door frame. 2. Slide your hand slowly up the door frame. 3. To increase the stretch, step through the door frame. Keep your body upright and do not lean. Wand exercises You will need a cane, a piece of PVC pipe, or a sturdy wooden dowel for wand exercises. Flexion To do this exercise while standing: 1. Hold the wand with both of your hands, palms down. 2. Using the other arm to help, lift your arms up and over your head, if able. 3. Push upward with your other arm to gently increase the stretch. To do this exercise while lying down: 1. Lie on your back with your elbows resting on the floor and the wand in both your hands. Your hands will be palm down, or pointing toward your feet. 2. Lift your hands toward the ceiling, using your unaffected arm to help if needed. 3. Bring your arms overhead as able, using your unaffected arm to help if needed. Internal rotation 1. Stand while holding the wand behind you with both hands. Your unaffected arm should be extended above your head with the arm of the affected side extended behind you at the level of your waist. The wand should be pointing straight up and down as you hold it. 2. Slowly pull the wand up behind your back by straightening the elbow of your unaffected arm and bending the elbow of your affected arm. External rotation 1. Lie on your back with your affected upper arm supported on a small pillow or rolled towel. When you first do this exercise, keep your upper arm close to your body. Over time, bring your arm up to a 90 angle out to the side. 2. Hold the wand across your stomach and with both hands palm up. Your  elbow on your affected side should be bent at a 90 angle. 3. Use your unaffected side to help push your forearm away from you and toward the floor. Keep your elbow on your affected side bent at a 90 angle. Contact a health care provider if you have:  New or increasing pain.  New numbness, tingling, weakness, or discoloration in your arm or hand. This information is not intended to replace advice given to you by your health care provider. Make sure you discuss any questions you have with your health care provider. Document Revised: 11/14/2017 Document Reviewed: 11/14/2017 Elsevier Patient Education  2020 [employer].

## 2020-01-06 DIAGNOSIS — Z299 Encounter for prophylactic measures, unspecified: Secondary | ICD-10-CM | POA: Diagnosis not present

## 2020-01-06 DIAGNOSIS — M069 Rheumatoid arthritis, unspecified: Secondary | ICD-10-CM | POA: Diagnosis not present

## 2020-01-06 DIAGNOSIS — Z6841 Body Mass Index (BMI) 40.0 and over, adult: Secondary | ICD-10-CM | POA: Diagnosis not present

## 2020-01-06 DIAGNOSIS — R590 Localized enlarged lymph nodes: Secondary | ICD-10-CM | POA: Diagnosis not present

## 2020-01-06 DIAGNOSIS — R195 Other fecal abnormalities: Secondary | ICD-10-CM | POA: Diagnosis not present

## 2020-01-06 MED FILL — HUMIRA CF 40 MG/0.4ML PSKT: 40 | 28 days supply | Qty: 2 | Fill #2

## 2020-01-08 ENCOUNTER — Encounter: Payer: Self-pay | Admitting: Gastroenterology

## 2020-01-08 ENCOUNTER — Telehealth: Payer: Self-pay | Admitting: Gastroenterology

## 2020-01-08 NOTE — Telephone Encounter (Signed)
Hi Dr. Tarri Glenn,  We have received a referral from patients PCP for a positive stool card. Patient has colonoscopy in 2016 her records are in [place] for review.   Please advise on scheduling. Thank you

## 2020-01-08 NOTE — Telephone Encounter (Signed)
I would be happy to see her. Please schedule. Thanks.

## 2020-01-12 ENCOUNTER — Ambulatory Visit (AMBULATORY_SURGERY_CENTER): Payer: Self-pay | Admitting: *Deleted

## 2020-01-12 ENCOUNTER — Other Ambulatory Visit: Payer: Self-pay

## 2020-01-12 VITALS — Temp 97.1°F | Ht 65.0 in | Wt 295.0 lb

## 2020-01-12 DIAGNOSIS — R195 Other fecal abnormalities: Secondary | ICD-10-CM

## 2020-01-12 DIAGNOSIS — Z01818 Encounter for other preprocedural examination: Secondary | ICD-10-CM

## 2020-01-12 DIAGNOSIS — Z86010 Personal history of colonic polyps: Secondary | ICD-10-CM

## 2020-01-12 MED ORDER — SUPREP BOWEL PREP KIT 17.5-3.13-1.6 GM/177ML PO SOLN: 1.0000 | Freq: Once | ORAL | 0 refills | Status: AC

## 2020-01-12 MED FILL — SUPREP BOWEL PREP KIT: 17.5-3.13-1 | 1 days supply | Qty: 354 | Fill #0

## 2020-01-12 NOTE — Progress Notes (Signed)

## 2020-01-13 DIAGNOSIS — R590 Localized enlarged lymph nodes: Secondary | ICD-10-CM | POA: Diagnosis not present

## 2020-01-14 ENCOUNTER — Encounter: Payer: Self-pay | Admitting: Gastroenterology

## 2020-01-19 ENCOUNTER — Other Ambulatory Visit: Payer: Self-pay

## 2020-01-19 ENCOUNTER — Other Ambulatory Visit (HOSPITAL_COMMUNITY)
Admission: RE | Admit: 2020-01-19 | Discharge: 2020-01-19 | Disposition: A | Discharge status: Home / Self Care | Payer: 59 | Source: Ambulatory Visit | Attending: Gastroenterology | Admitting: Gastroenterology

## 2020-01-19 DIAGNOSIS — Z01812 Encounter for preprocedural laboratory examination: Secondary | ICD-10-CM | POA: Insufficient documentation

## 2020-01-19 DIAGNOSIS — Z20822 Contact with and (suspected) exposure to covid-19: Secondary | ICD-10-CM | POA: Diagnosis not present

## 2020-01-19 LAB — SARS CORONAVIRUS 2 (TAT 6-24 HRS): SARS Coronavirus 2: NEGATIVE

## 2020-01-21 ENCOUNTER — Other Ambulatory Visit: Payer: Self-pay

## 2020-01-21 ENCOUNTER — Ambulatory Visit (AMBULATORY_SURGERY_CENTER): Payer: 59 | Admitting: Gastroenterology

## 2020-01-21 ENCOUNTER — Encounter: Payer: Self-pay | Admitting: Gastroenterology

## 2020-01-21 VITALS — BP 94/56 | HR 66 | Temp 96.8°F | Resp 21 | Ht 65.0 in | Wt 295.0 lb

## 2020-01-21 DIAGNOSIS — Z1211 Encounter for screening for malignant neoplasm of colon: Secondary | ICD-10-CM | POA: Diagnosis not present

## 2020-01-21 DIAGNOSIS — D124 Benign neoplasm of descending colon: Secondary | ICD-10-CM | POA: Diagnosis not present

## 2020-01-21 DIAGNOSIS — Z86010 Personal history of colonic polyps: Secondary | ICD-10-CM

## 2020-01-21 DIAGNOSIS — D122 Benign neoplasm of ascending colon: Secondary | ICD-10-CM | POA: Diagnosis not present

## 2020-01-21 MED ORDER — SODIUM CHLORIDE 0.9 % IV SOLN: 500.0000 mL | Freq: Once | INTRAVENOUS | Status: DC

## 2020-01-21 NOTE — Patient Instructions (Signed)
YOU HAD AN ENDOSCOPIC PROCEDURE TODAY AT [facility]:   Refer to the procedure report that was given to you for any specific questions about what was found during the examination.  If the procedure report does not answer your questions, please call your gastroenterologist to clarify.  If you requested that your care partner not be given the details of your procedure findings, then the procedure report has been included in a sealed envelope for you to review at your convenience later.  YOU SHOULD EXPECT: Some feelings of bloating in the abdomen. Passage of more gas than usual.  Walking can help get rid of the air that was put into your GI tract during the procedure and reduce the bloating. If you had a lower endoscopy (such as a colonoscopy or flexible sigmoidoscopy) you may notice spotting of blood in your stool or on the toilet paper. If you underwent a bowel prep for your procedure, you may not have a normal bowel movement for a few days.  Please Note:  You might notice some irritation and congestion in your nose or some drainage.  This is from the oxygen used during your procedure.  There is no need for concern and it should clear up in a day or so.  SYMPTOMS TO REPORT IMMEDIATELY:   Following lower endoscopy (colonoscopy or flexible sigmoidoscopy):  Excessive amounts of blood in the stool  Significant tenderness or worsening of abdominal pains  Swelling of the abdomen that is new, acute  Fever of 100F or higher    For urgent or emergent issues, a gastroenterologist can be reached at any hour by calling [phone]. Do not use MyChart messaging for urgent concerns.    DIET:  We do recommend a small meal at first, but then you may proceed to your regular diet.  Drink plenty of fluids but you should avoid alcoholic beverages for 24 hours.  ACTIVITY:  You should plan to take it easy for the rest of today and you should NOT DRIVE or use heavy machinery until tomorrow  (because of the sedation medicines used during the test).    FOLLOW UP: Our staff will call the number listed on your records 48-72 hours following your procedure to check on you and address any questions or concerns that you may have regarding the information given to you following your procedure. If we do not reach you, we will leave a message.  We will attempt to reach you two times.  During this call, we will ask if you have developed any symptoms of COVID 19. If you develop any symptoms (ie: fever, flu-like symptoms, shortness of breath, cough etc.) before then, please call [phone].  If you test positive for Covid 19 in the 2 weeks post procedure, please call and report this information to [place].    If any biopsies were taken you will be contacted by phone or by letter within the next 1-3 weeks.  Please call us at [phone] if you have not heard about the biopsies in 3 weeks.    SIGNATURES/CONFIDENTIALITY: You and/or your care partner have signed paperwork which will be entered into your electronic medical record.  These signatures attest to the fact that that the information above on your After Visit Summary has been reviewed and is understood.  Full responsibility of the confidentiality of this discharge information lies with you and/or your care-partner.   Resume medications.Follow high fiber diet.Drink at least 64 oz of water daily.Add a daily stool  bulking agent such as psyllium ( over the counter). Information given on polyps,diverticulosis,hemorrhoids and high fiber diet.

## 2020-01-21 NOTE — Progress Notes (Signed)
Report given to PACU, vss 

## 2020-01-21 NOTE — Op Note (Signed)
[place] Patient Name: Sarah Dean Procedure Date: 01/21/2020 10:32 AM MRN: [id number] Endoscopist: Thornton Park MD, MD Age: 54 Referring MD:  Date of Birth: Feb 23, 1966 Gender: Female Account #: [id number] Procedure:                Colonoscopy Indications:              Surveillance: Personal history of adenomatous                            polyps on last colonoscopy 5 years ago                           Tubular adenoma removed on colonoscopy with Dr.                            Oneida Alar 03/2015                           No known family history of colon cancer or polyps Medicines:                Monitored Anesthesia Care Procedure:                Pre-Anesthesia Assessment:                           - Prior to the procedure, a History and Physical                            was performed, and patient medications and                            allergies were reviewed. The patient's tolerance of                            previous anesthesia was also reviewed. The risks                            and benefits of the procedure and the sedation                            options and risks were discussed with the patient.                            All questions were answered, and informed consent                            was obtained. Prior Anticoagulants: The patient has                            taken no previous anticoagulant or antiplatelet                            agents. ASA Grade Assessment: II - A patient with  mild systemic disease. After reviewing the risks                            and benefits, the patient was deemed in                            satisfactory condition to undergo the procedure.                           After obtaining informed consent, the colonoscope                            was passed under direct vision. Throughout the                            procedure, the patient's blood pressure, pulse, and                    oxygen saturations were monitored continuously. The                            Colonoscope was introduced through the anus and                            advanced to the 3 cm into the ileum. A second                            forward view of the right colon was performed. The                            colonoscopy was performed without difficulty. The                            patient tolerated the procedure well. The quality                            of the bowel preparation was good. The terminal                            ileum, ileocecal valve, appendiceal orifice, and                            rectum were photographed. Scope In: 10:40:25 AM Scope Out: 10:59:00 AM Scope Withdrawal Time: 0 hours 15 minutes 28 seconds  Total Procedure Duration: 0 hours 18 minutes 35 seconds  Findings:                 Hemorrhoids were found on perianal exam.                           Non-bleeding internal hemorrhoids were found.                           Multiple small and large-mouthed diverticula were  found in the sigmoid colon and descending colon.                           A 2 mm polyp was found in the descending colon. The                            polyp was sessile. The polyp was removed with a                            cold snare. Resection and retrieval were complete.                            Estimated blood loss was minimal.                           Two sessile polyps were found in the proximal                            ascending colon. The polyps were 1 to 2 mm in size.                            These polyps were removed with a cold snare.                            Resection and retrieval were complete. Estimated                            blood loss was minimal.                           The exam was otherwise without abnormality on                            direct and retroflexion views. Complications:            No immediate  complications. Estimated blood loss:                            Minimal. Estimated Blood Loss:     Estimated blood loss was minimal. Impression:               - Hemorrhoids found on perianal exam.                           - Non-bleeding internal hemorrhoids.                           - Diverticulosis in the sigmoid colon and in the                            descending colon.                           - One 2 mm polyp in the descending colon, removed  with a cold snare. Resected and retrieved.                           - Two 1 to 2 mm polyps in the proximal ascending                            colon, removed with a cold snare. Resected and                            retrieved.                           - The examination was otherwise normal on direct                            and retroflexion views. Recommendation:           - Patient has a contact number available for                            emergencies. The signs and symptoms of potential                            delayed complications were discussed with the                            patient. Return to normal activities tomorrow.                            Written discharge instructions were provided to the                            patient.                           - Continue present medications.                           - Await pathology results.                           - Repeat colonoscopy date to be determined after                            pending pathology results are reviewed for                            surveillance.                           - Follow a high fiber diet. Drink at least 64                            ounces of water daily. Add a daily stool bulking  agent such as psyllium (an exampled would be                            Metamucil).                           - Emerging evidence supports eating a diet of                            fruits,  vegetables, grains, calcium, and yogurt                            while reducing red meat and alcohol may reduce the                            risk of colon cancer.                           - Thank you for allowing me to be involved in your                            colon cancer prevention. Thornton Park MD, MD 01/21/2020 11:04:14 AM This report has been signed electronically.

## 2020-01-21 NOTE — Progress Notes (Signed)
Temp check by:JB Vital check by:KA  The patient states no changes in medical or surgical history since pre-visit screening on 01/12/2020.

## 2020-01-21 NOTE — Progress Notes (Signed)
Called to room to assist during endoscopic procedure.  Patient ID and intended procedure confirmed with present staff. Received instructions for my participation in the procedure from the performing physician.  

## 2020-01-23 ENCOUNTER — Telehealth: Payer: Self-pay

## 2020-01-23 NOTE — Telephone Encounter (Signed)
  Follow up Call-  Call back number 01/21/2020  Post procedure Call Back phone  # [phone]  Permission to leave phone message Yes  Some recent data might be hidden     Patient questions:  Do you have a fever, pain , or abdominal swelling? No. Pain Score  0 *  Have you tolerated food without any problems? Yes.    Have you been able to return to your normal activities? Yes.    Do you have any questions about your discharge instructions: Diet   No. Medications  No. Follow up visit  No.  Do you have questions or concerns about your Care? No.  Actions: * If pain score is 4 or above: No action needed, pain <4.  1. Have you developed a fever since your procedure? No  2.   Have you had an respiratory symptoms (SOB or cough) since your procedure? No  3.   Have you tested positive for COVID 19 since your procedure No  4.   Have you had any family members/close contacts diagnosed with the COVID 19 since your procedure?  No  If yes to any of these questions please route to Joylene John, RN and Erenest Rasher, RN

## 2020-01-26 ENCOUNTER — Encounter: Payer: Self-pay | Admitting: Gastroenterology

## 2020-01-27 DIAGNOSIS — D485 Neoplasm of uncertain behavior of skin: Secondary | ICD-10-CM | POA: Diagnosis not present

## 2020-01-27 DIAGNOSIS — L309 Dermatitis, unspecified: Secondary | ICD-10-CM | POA: Diagnosis not present

## 2020-01-27 DIAGNOSIS — L570 Actinic keratosis: Secondary | ICD-10-CM | POA: Diagnosis not present

## 2020-01-27 DIAGNOSIS — D2272 Melanocytic nevi of left lower limb, including hip: Secondary | ICD-10-CM | POA: Diagnosis not present

## 2020-01-28 ENCOUNTER — Telehealth: Payer: Self-pay | Admitting: Pharmacist

## 2020-01-28 NOTE — Telephone Encounter (Signed)
Called patient to schedule an appointment for the [facility]. I was unable to reach the patient so I left a HIPAA-compliant message requesting that the patient return my call.   

## 2020-02-02 MED FILL — TRIAMCINOLONE 0.1% OINTMENT: 0.1 | 14 days supply | Qty: 30 | Fill #0

## 2020-02-09 MED FILL — BUMETANIDE 1 MG TABS: 1 | 30 days supply | Qty: 30 | Fill #1

## 2020-02-09 MED FILL — FOLIC ACID 1 MG TABS: 1 | 30 days supply | Qty: 60 | Fill #5

## 2020-02-16 MED FILL — HUMIRA CF 40 MG/0.4ML PSKT: 40 | 28 days supply | Qty: 2 | Fill #3

## 2020-02-17 DIAGNOSIS — Z79899 Other long term (current) drug therapy: Secondary | ICD-10-CM | POA: Diagnosis not present

## 2020-02-17 DIAGNOSIS — M0589 Other rheumatoid arthritis with rheumatoid factor of multiple sites: Secondary | ICD-10-CM | POA: Diagnosis not present

## 2020-02-17 DIAGNOSIS — Z6841 Body Mass Index (BMI) 40.0 and over, adult: Secondary | ICD-10-CM | POA: Diagnosis not present

## 2020-02-17 DIAGNOSIS — M150 Primary generalized (osteo)arthritis: Secondary | ICD-10-CM | POA: Diagnosis not present

## 2020-03-08 MED FILL — BUMETANIDE 1 MG TABS: 1 | 30 days supply | Qty: 30 | Fill #2

## 2020-03-08 MED FILL — FOLIC ACID 1 MG TABS: 1 | 30 days supply | Qty: 60 | Fill #6

## 2020-03-23 DIAGNOSIS — W540XXA Bitten by dog, initial encounter: Secondary | ICD-10-CM | POA: Diagnosis not present

## 2020-03-23 DIAGNOSIS — Z299 Encounter for prophylactic measures, unspecified: Secondary | ICD-10-CM | POA: Diagnosis not present

## 2020-03-23 DIAGNOSIS — L03119 Cellulitis of unspecified part of limb: Secondary | ICD-10-CM | POA: Diagnosis not present

## 2020-03-23 DIAGNOSIS — Z6841 Body Mass Index (BMI) 40.0 and over, adult: Secondary | ICD-10-CM | POA: Diagnosis not present

## 2020-03-23 DIAGNOSIS — M069 Rheumatoid arthritis, unspecified: Secondary | ICD-10-CM | POA: Diagnosis not present

## 2020-03-23 DIAGNOSIS — S61451A Open bite of right hand, initial encounter: Secondary | ICD-10-CM | POA: Diagnosis not present

## 2020-04-06 MED FILL — BUMETANIDE 1 MG TABS: 1 | 30 days supply | Qty: 30 | Fill #3

## 2020-04-06 MED FILL — FOLIC ACID 1 MG TABS: 1 | 30 days supply | Qty: 60 | Fill #0

## 2020-05-04 ENCOUNTER — Other Ambulatory Visit (HOSPITAL_COMMUNITY): Payer: Self-pay | Admitting: Nurse Practitioner

## 2020-05-04 MED FILL — FOLIC ACID 1 MG TABS: 1 | 30 days supply | Qty: 60 | Fill #1

## 2020-05-04 MED FILL — BUMETANIDE 1 MG TABS: 1 | 30 days supply | Qty: 30 | Fill #0

## 2020-05-19 ENCOUNTER — Ambulatory Visit: Payer: 59 | Admitting: Certified Nurse Midwife

## 2020-05-21 NOTE — Progress Notes (Signed)
54 y.o. [id number] Married Caucasian female here for annual exam.    Some weight gain. Likes to walk outside on trails.  Stopped her [name] after her visit here last year.  Hot flashes have resolved.  She is doing herbal therapy.   Having issues with her hand following a dog bite.  Likes to garden and produces medicinal herbs. Husband is working for [employer] from home.  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  Has not done Covid vaccine.   PCP:   Monico Blitz, MD   Patient's last menstrual period was 04/25/2007.           Sexually active: Yes.    The current method of family planning is hysterectomy   .    Exercising: No.  some gardening Smoker:  no  Health Maintenance:  Pap:  03/22/12 History of abnormal Pap:  Yes, years ago  MMG:  05/19/19  Category B, Density Birads 1: Negative -- appt. today at [facility]  Colonoscopy:  01/21/20 negative:01/2023   BMD:  n/a  Result  n/a TDaP:  05/14/19 Gardasil:   no HIV: Neg 10-15-years ago Hep C: Unsure.  She will do blood work today with her rheumatologist. Screening Labs: PCP   reports that she has quit smoking. She has never used smokeless tobacco. She reports current drug use. Drug: Marijuana. She reports that she does not drink alcohol.  Past Medical History:  Diagnosis Date  . Abnormal Pap smear of cervix    yrs ago  . Adenomatous colon polyp   . Allergy    seasonal   . Anemia    past hx as young woman   . Bursitis of shoulder, right 2021  . Cataract    forming   . Condyloma   . Hemorrhoids   . Morbid obesity ([place])   . Pneumonia    hx of   . Rheumatoid arthritis ([place]) 2005  . STD (sexually transmitted disease)    + trich  . Tubal ectopic pregnancy    times 2    Past Surgical History:  Procedure Laterality Date  . ANKLE SURGERY    . CHOLECYSTECTOMY  04/25/07  . COLONOSCOPY N/A 03/24/2015   FTD:DUKGU external hemorrhoids/moderate sized internal hemorrhoids/moderate diverticulosis in the sigmoid colon  . COLONOSCOPY     . HEMORRHOID BANDING N/A 03/24/2015   Procedure: HEMORRHOID BANDING;  Surgeon: Danie Binder, MD;  Location: [facility];  Service: Endoscopy;  Laterality: N/A;  . LAPAROSCOPIC GASTRIC SLEEVE RESECTION N/A 08/13/2017   Procedure: LAPAROSCOPIC GASTRIC SLEEVE RESECTION, UPPER ENDOSCOPY HIATAL HERNIA REPAIR;  Surgeon: Kinsinger, [name], MD;  Location: [facility];  Service: General;  Laterality: N/A;  . POLYPECTOMY    . TOTAL ABDOMINAL HYSTERECTOMY  04/25/07   BSO  . WISDOM TOOTH EXTRACTION      Current Outpatient Medications  Medication Sig Dispense Refill  . bumetanide (BUMEX) 1 MG tablet Take 1 tablet by mouth daily as needed (fluid). occ    . cetirizine (ZYRTEC) 10 MG tablet Take 10 mg by mouth daily.    . folic acid (FOLVITE) 1 MG tablet Take 2 mg by mouth daily.     . Methotrexate Sodium (METHOTREXATE, PF,) 50 MG/2ML injection INJECT 0.8 MLS WEEKLY. SINGLE USE VIAL. DISCARD AFTER USE.    . Multiple Vitamin (MULTIVITAMIN IRON-FREE) TABS     . triamcinolone ointment (KENALOG) 0.1 % SMARTSIG:Patch(s) Topical 1 to 2 Times Daily PRN    . UNABLE TO FIND daily. Bariatric multivitamin  No current facility-administered medications for this visit.    Family History  Problem Relation Age of Onset  . Cancer Father   . Cirrhosis Father   . Esophageal cancer Father   . Atrial fibrillation Mother   . Colon cancer Neg Hx   . Colon polyps Neg Hx   . Rectal cancer Neg Hx   . Stomach cancer Neg Hx     Review of Systems  All other systems reviewed and are negative.   Exam:   BP 110/68 (Cuff Size: Large)   Pulse 88   Resp 14   Ht 5\' 5"  (1.651 m)   Wt (!) 309 lb 12.8 oz (140.5 kg)   LMP 04/25/2007   BMI 51.55 kg/m     General appearance: alert, cooperative and appears stated age Head: normocephalic, without obvious abnormality, atraumatic Neck: no adenopathy, supple, symmetrical, trachea midline and thyroid normal to inspection and palpation Lungs: clear to auscultation  bilaterally Breasts: normal appearance, no masses or tenderness, No nipple retraction or dimpling, No nipple discharge or bleeding, No axillary adenopathy Heart: regular rate and rhythm Abdomen: soft, non-tender; no masses, no organomegaly Extremities: extremities normal, atraumatic, no cyanosis or edema Skin: skin color, texture, turgor normal. No rashes or lesions Lymph nodes: cervical, supraclavicular, and axillary nodes normal. Neurologic: grossly normal  Pelvic: External genitalia:  no lesions              No abnormal inguinal nodes palpated.              Urethra:  normal appearing urethra with no masses, tenderness or lesions              Bartholins and Skenes: normal                 Vagina: normal appearing vagina with normal color and discharge, no lesions              Cervix: absent              Pap taken: No. Bimanual Exam:  Uterus:  absent              Adnexa: no mass, fullness, tenderness              Rectal exam: Yes.  .  Confirms.              Anus:  normal sphincter tone, external and internal hemorrhoids.  Known.  Chaperone was present for exam.  Assessment:   Well woman visit with normal exam. Status post TAH/BSO.   Benign ovarian cysts.   Off HRT. Status post gastric sleeve weight loss surgery.  RA.  Hemorrhoids.  Plan: Mammogram screening discussed. Self breast awareness reviewed. Pap and HR HPV as above. Guidelines for Calcium, Vitamin D, regular exercise program including cardiovascular and weight bearing exercise.   Follow up annually and prn.   After visit summary provided.

## 2020-05-25 ENCOUNTER — Ambulatory Visit (INDEPENDENT_AMBULATORY_CARE_PROVIDER_SITE_OTHER): Payer: 59 | Admitting: Obstetrics and Gynecology

## 2020-05-25 ENCOUNTER — Other Ambulatory Visit: Payer: Self-pay

## 2020-05-25 ENCOUNTER — Encounter: Payer: Self-pay | Admitting: Obstetrics and Gynecology

## 2020-05-25 VITALS — BP 110/68 | HR 88 | Resp 14 | Ht 65.0 in | Wt 309.8 lb

## 2020-05-25 DIAGNOSIS — Z01419 Encounter for gynecological examination (general) (routine) without abnormal findings: Secondary | ICD-10-CM | POA: Diagnosis not present

## 2020-05-25 DIAGNOSIS — Z1231 Encounter for screening mammogram for malignant neoplasm of breast: Secondary | ICD-10-CM | POA: Diagnosis not present

## 2020-05-25 DIAGNOSIS — M0589 Other rheumatoid arthritis with rheumatoid factor of multiple sites: Secondary | ICD-10-CM | POA: Diagnosis not present

## 2020-05-25 NOTE — Patient Instructions (Signed)

## 2020-06-04 MED FILL — TRIAMCINOLONE 0.1% OINTMENT: 0.1 | 14 days supply | Qty: 30 | Fill #1

## 2020-06-09 MED FILL — METHOTREXATE SODIUM (PF) 50: 50 | 35 days supply | Qty: 10 | Fill #0

## 2020-06-10 ENCOUNTER — Other Ambulatory Visit: Payer: 59

## 2020-06-10 ENCOUNTER — Other Ambulatory Visit: Payer: Self-pay

## 2020-06-10 DIAGNOSIS — Z20822 Contact with and (suspected) exposure to covid-19: Secondary | ICD-10-CM

## 2020-06-11 LAB — SARS-COV-2, NAA 2 DAY TAT

## 2020-06-11 LAB — NOVEL CORONAVIRUS, NAA: SARS-CoV-2, NAA: NOT DETECTED

## 2020-06-15 ENCOUNTER — Other Ambulatory Visit (HOSPITAL_COMMUNITY): Payer: Self-pay | Admitting: Internal Medicine

## 2020-06-15 MED FILL — BUMETANIDE 1 MG TABS: 1 | 30 days supply | Qty: 30 | Fill #1

## 2020-06-15 MED FILL — FOLIC ACID 1 MG TABS: 1 | 30 days supply | Qty: 60 | Fill #0

## 2020-06-23 DIAGNOSIS — M069 Rheumatoid arthritis, unspecified: Secondary | ICD-10-CM | POA: Diagnosis not present

## 2020-06-23 DIAGNOSIS — Z6841 Body Mass Index (BMI) 40.0 and over, adult: Secondary | ICD-10-CM | POA: Diagnosis not present

## 2020-06-23 DIAGNOSIS — M7660 Achilles tendinitis, unspecified leg: Secondary | ICD-10-CM | POA: Diagnosis not present

## 2020-06-23 DIAGNOSIS — Z299 Encounter for prophylactic measures, unspecified: Secondary | ICD-10-CM | POA: Diagnosis not present

## 2020-07-14 DIAGNOSIS — Z299 Encounter for prophylactic measures, unspecified: Secondary | ICD-10-CM | POA: Diagnosis not present

## 2020-07-14 DIAGNOSIS — Z789 Other specified health status: Secondary | ICD-10-CM | POA: Diagnosis not present

## 2020-07-14 DIAGNOSIS — J019 Acute sinusitis, unspecified: Secondary | ICD-10-CM | POA: Diagnosis not present

## 2020-07-14 DIAGNOSIS — M069 Rheumatoid arthritis, unspecified: Secondary | ICD-10-CM | POA: Diagnosis not present

## 2020-07-14 MED FILL — FOLIC ACID 1 MG TABS: 1 | 30 days supply | Qty: 60 | Fill #1

## 2020-07-14 MED FILL — BUMETANIDE 1 MG TABLET: 1 | 30 days supply | Qty: 30 | Fill #2

## 2020-07-16 ENCOUNTER — Ambulatory Visit (HOSPITAL_BASED_OUTPATIENT_CLINIC_OR_DEPARTMENT_OTHER): Payer: 59 | Admitting: Pharmacist

## 2020-07-16 ENCOUNTER — Other Ambulatory Visit: Payer: Self-pay

## 2020-07-16 DIAGNOSIS — Z79899 Other long term (current) drug therapy: Secondary | ICD-10-CM

## 2020-07-16 NOTE — Progress Notes (Signed)
  S: Patient presents for review of their specialty medication therapy. Patient is currently taking Humira for rheumatoid arthritis. Patient is managed by Dr. Amil Amen for this.  Adherence: has not taken Humira in months due to insurance not covering the CF product.   Efficacy: reports that her RA is beginning to come back due to being off the medication  Dosing:  Rheumatoid arthritis: SubQ: 40 mg every other week (may continue methotrexate, other nonbiologic DMARDS, corticosteroids, NSAIDs, and/or analgesics)  Dose adjustments: Renal: no dose adjustments (has not been studied) Hepatic: no dose adjustments (has not been studied)  Drug-drug interactions: taking MTX as well; no significant DDIs identified  Screening: TB test: completed per pt Hepatitis: competed per pt   Monitoring: S/sx of infection: denies  CBC: monitored by RA S/sx of hypersensitivity: denies  S/sx of malignancy: denies  S/sx of heart failure: denies    Other side effects: none   O:  Lab Results  Component Value Date   WBC 7.7 08/14/2017   HGB 11.9 (L) 08/14/2017   HCT 36.8 08/14/2017   MCV 91.8 08/14/2017   PLT 217 08/14/2017     Chemistry      Component Value Date/Time   NA 141 08/14/2017 0550   K 4.5 08/14/2017 0550   CL 106 08/14/2017 0550   CO2 27 08/14/2017 0550   BUN 9 08/14/2017 0550   CREATININE 0.63 08/14/2017 0550      Component Value Date/Time   CALCIUM 8.9 08/14/2017 0550   ALKPHOS 46 08/14/2017 0550   AST 45 (H) 08/14/2017 0550   ALT 68 (H) 08/14/2017 0550   BILITOT 0.9 08/14/2017 0550     A/P: 1. Medication review: Patient currently on Humira for RA. Reviewed the medication with the patient, including the following: Humira is a TNF blocking agent indicated for ankylosing spondylitis, Crohn's disease, Hidradenitis suppurativa, psoriatic arthritis, plaque psoriasis, ulcerative colitis, and uveitis. Patient educated on purpose, proper use and potential adverse effects of Humira.  Possible adverse effects are increased risk of infections, headache, and injection site reactions. There is the possibility of an increased risk of malignancy but it is not well understood if this increased risk is due to there medication or the disease state. I called patient's pharmacy and she has a CF product on file that is approved to fill. I placed this refill for her.   Benard Halsted, PharmD, [place]  [phone]

## 2020-08-02 DIAGNOSIS — Z20828 Contact with and (suspected) exposure to other viral communicable diseases: Secondary | ICD-10-CM | POA: Diagnosis not present

## 2020-08-04 DIAGNOSIS — J069 Acute upper respiratory infection, unspecified: Secondary | ICD-10-CM | POA: Diagnosis not present

## 2020-08-04 DIAGNOSIS — Z6841 Body Mass Index (BMI) 40.0 and over, adult: Secondary | ICD-10-CM | POA: Diagnosis not present

## 2020-08-04 DIAGNOSIS — M069 Rheumatoid arthritis, unspecified: Secondary | ICD-10-CM | POA: Diagnosis not present

## 2020-08-04 DIAGNOSIS — Z299 Encounter for prophylactic measures, unspecified: Secondary | ICD-10-CM | POA: Diagnosis not present

## 2020-08-13 DIAGNOSIS — J309 Allergic rhinitis, unspecified: Secondary | ICD-10-CM | POA: Diagnosis not present

## 2020-08-13 DIAGNOSIS — J209 Acute bronchitis, unspecified: Secondary | ICD-10-CM | POA: Diagnosis not present

## 2020-08-13 DIAGNOSIS — Z6841 Body Mass Index (BMI) 40.0 and over, adult: Secondary | ICD-10-CM | POA: Diagnosis not present

## 2020-08-13 DIAGNOSIS — Z299 Encounter for prophylactic measures, unspecified: Secondary | ICD-10-CM | POA: Diagnosis not present

## 2020-08-16 MED FILL — FOLIC ACID 1 MG TABS: 1 | 30 days supply | Qty: 60 | Fill #2

## 2020-08-16 MED FILL — BUMETANIDE 1 MG TABLET: 1 | 30 days supply | Qty: 30 | Fill #3

## 2020-08-19 ENCOUNTER — Other Ambulatory Visit: Payer: Self-pay | Admitting: Pharmacist

## 2020-08-19 DIAGNOSIS — M0589 Other rheumatoid arthritis with rheumatoid factor of multiple sites: Secondary | ICD-10-CM | POA: Diagnosis not present

## 2020-08-19 DIAGNOSIS — Z79899 Other long term (current) drug therapy: Secondary | ICD-10-CM | POA: Diagnosis not present

## 2020-08-19 DIAGNOSIS — M150 Primary generalized (osteo)arthritis: Secondary | ICD-10-CM | POA: Diagnosis not present

## 2020-08-19 DIAGNOSIS — Z6841 Body Mass Index (BMI) 40.0 and over, adult: Secondary | ICD-10-CM | POA: Diagnosis not present

## 2020-08-19 MED ORDER — HUMIRA (2 SYRINGE) 40 MG/0.4ML ~~LOC~~ PSKT: PREFILLED_SYRINGE | SUBCUTANEOUS | 5 refills | Status: DC

## 2020-08-19 MED FILL — HUMIRA CF 40 MG/0.4ML PSKT: 40 | 28 days supply | Qty: 2 | Fill #0

## 2020-09-01 DIAGNOSIS — Z299 Encounter for prophylactic measures, unspecified: Secondary | ICD-10-CM | POA: Diagnosis not present

## 2020-09-01 DIAGNOSIS — R5383 Other fatigue: Secondary | ICD-10-CM | POA: Diagnosis not present

## 2020-09-01 DIAGNOSIS — R059 Cough, unspecified: Secondary | ICD-10-CM | POA: Diagnosis not present

## 2020-09-01 DIAGNOSIS — M069 Rheumatoid arthritis, unspecified: Secondary | ICD-10-CM | POA: Diagnosis not present

## 2020-09-01 DIAGNOSIS — Z6841 Body Mass Index (BMI) 40.0 and over, adult: Secondary | ICD-10-CM | POA: Diagnosis not present

## 2020-09-01 DIAGNOSIS — R0989 Other specified symptoms and signs involving the circulatory and respiratory systems: Secondary | ICD-10-CM | POA: Diagnosis not present

## 2020-09-13 MED FILL — HUMIRA CF 40 MG/0.4ML PSKT: 40 | 28 days supply | Qty: 2 | Fill #1

## 2020-09-15 MED FILL — FOLIC ACID 1 MG TABS: 1 | 30 days supply | Qty: 60 | Fill #3

## 2020-09-21 ENCOUNTER — Other Ambulatory Visit (HOSPITAL_COMMUNITY): Payer: Self-pay | Admitting: Internal Medicine

## 2020-09-21 MED FILL — METHOTREXATE SODIUM (PF) 50: 50 | 35 days supply | Qty: 10 | Fill #0

## 2020-09-23 ENCOUNTER — Other Ambulatory Visit (HOSPITAL_COMMUNITY): Payer: Self-pay | Admitting: Internal Medicine

## 2020-09-24 MED FILL — BUMETANIDE 1 MG TABLET: 1 | 30 days supply | Qty: 30 | Fill #0

## 2020-09-28 DIAGNOSIS — S90452A Superficial foreign body, left great toe, initial encounter: Secondary | ICD-10-CM | POA: Diagnosis not present

## 2020-09-28 DIAGNOSIS — M79672 Pain in left foot: Secondary | ICD-10-CM | POA: Diagnosis not present

## 2020-10-20 MED FILL — BUMETANIDE 1 MG TABS: 1 | 30 days supply | Qty: 30 | Fill #1

## 2020-10-20 MED FILL — FOLIC ACID 1 MG TABS: 1 | 30 days supply | Qty: 60 | Fill #4

## 2020-11-02 MED FILL — HUMIRA CF 40 MG/0.4ML PSKT: 40 | 28 days supply | Qty: 2 | Fill #2

## 2020-11-17 DIAGNOSIS — M0589 Other rheumatoid arthritis with rheumatoid factor of multiple sites: Secondary | ICD-10-CM | POA: Diagnosis not present

## 2020-11-19 MED FILL — BUMETANIDE 1 MG TABS: 1 | 30 days supply | Qty: 30 | Fill #2

## 2020-11-19 MED FILL — FOLIC ACID 1 MG TABS: 1 | 30 days supply | Qty: 60 | Fill #5

## 2020-12-03 ENCOUNTER — Other Ambulatory Visit (HOSPITAL_COMMUNITY): Payer: Self-pay | Admitting: Internal Medicine

## 2020-12-03 MED FILL — METHOTREXATE SODIUM (PF) 50: 50 | 35 days supply | Qty: 10 | Fill #0

## 2020-12-10 ENCOUNTER — Telehealth: Payer: 59 | Admitting: Emergency Medicine

## 2020-12-10 ENCOUNTER — Other Ambulatory Visit: Payer: Self-pay | Admitting: Emergency Medicine

## 2020-12-10 DIAGNOSIS — R300 Dysuria: Secondary | ICD-10-CM

## 2020-12-10 MED ORDER — CEPHALEXIN 500 MG PO CAPS: 500.0000 mg | ORAL_CAPSULE | Freq: Two times a day (BID) | ORAL | 0 refills | Status: DC

## 2020-12-10 MED FILL — CEPHALEXIN 500 MG CAPSULE: 500 | 7 days supply | Qty: 14 | Fill #0

## 2020-12-10 NOTE — Progress Notes (Signed)
We are sorry that you are not feeling well.  Here is how we plan to help!  Based on what you shared with me it looks like you most likely have a simple urinary tract infection.  A UTI (Urinary Tract Infection) is a bacterial infection of the bladder.  Most cases of urinary tract infections are simple to treat but a key part of your care is to encourage you to drink plenty of fluids and watch your symptoms carefully.  I have prescribed Keflex 500 mg twice a day for 7 days.  Your symptoms should gradually improve. Call us if the burning in your urine worsens, you develop worsening fever, back pain or pelvic pain or if your symptoms do not resolve after completing the antibiotic.  Urinary tract infections can be prevented by drinking plenty of water to keep your body hydrated.  Also be sure when you wipe, wipe from front to back and don't hold it in!  If possible, empty your bladder every 4 hours.  Your e-visit answers were reviewed by a board certified advanced clinical practitioner to complete your personal care plan.  Depending on the condition, your plan could have included both over the counter or prescription medications.  If there is a problem please reply  once you have received a response from your provider.  Your safety is important to [place].  If you have drug allergies check your prescription carefully.    You can use MyChart to ask questions about todays visit, request a non-urgent call back, or ask for a work or school excuse for 24 hours related to this e-Visit. If it has been greater than 24 hours you will need to follow up with your provider, or enter a new e-Visit to address those concerns.   You will get an e-mail in the next two days asking about your experience.  I hope that your e-visit has been valuable and will speed your recovery. Thank you for using e-visits.   Approximately 5 minutes was used in reviewing the patient's chart, questionnaire, prescribing medications, and  documentation.

## 2020-12-13 MED FILL — HUMIRA CF 40 MG/0.4ML PSKT: 40 | 28 days supply | Qty: 2 | Fill #3

## 2020-12-15 MED FILL — FOLIC ACID 1 MG TABS: 1 | 30 days supply | Qty: 60 | Fill #6

## 2020-12-15 MED FILL — BUMETANIDE 1 MG TABS: 1 | 30 days supply | Qty: 30 | Fill #3

## 2021-01-11 ENCOUNTER — Other Ambulatory Visit (HOSPITAL_COMMUNITY): Payer: Self-pay

## 2021-01-15 ENCOUNTER — Other Ambulatory Visit (HOSPITAL_COMMUNITY): Payer: Self-pay

## 2021-01-15 MED FILL — Adalimumab Prefilled Syringe Kit 40 MG/0.4ML: SUBCUTANEOUS | 28 days supply | Qty: 2 | Fill #0 | Status: AC

## 2021-01-16 ENCOUNTER — Other Ambulatory Visit (HOSPITAL_COMMUNITY): Payer: Self-pay

## 2021-01-17 ENCOUNTER — Other Ambulatory Visit (HOSPITAL_COMMUNITY): Payer: Self-pay

## 2021-01-18 ENCOUNTER — Other Ambulatory Visit (HOSPITAL_COMMUNITY): Payer: Self-pay

## 2021-01-18 ENCOUNTER — Other Ambulatory Visit (HOSPITAL_COMMUNITY): Payer: Self-pay | Admitting: Internal Medicine

## 2021-01-18 MED ORDER — BUMETANIDE 1 MG PO TABS
1.0000 mg | ORAL_TABLET | Freq: Every day | ORAL | 3 refills | Status: DC
  Filled 2021-01-18: qty 30, 30d supply, fill #0
  Filled 2021-01-24 – 2021-02-21 (×2): qty 30, 30d supply, fill #1
  Filled 2021-03-23: qty 30, 30d supply, fill #2
  Filled 2021-04-21: qty 30, 30d supply, fill #3

## 2021-01-20 ENCOUNTER — Other Ambulatory Visit (HOSPITAL_COMMUNITY): Payer: Self-pay

## 2021-01-24 ENCOUNTER — Other Ambulatory Visit (HOSPITAL_COMMUNITY): Payer: Self-pay

## 2021-01-24 ENCOUNTER — Other Ambulatory Visit (HOSPITAL_COMMUNITY): Payer: Self-pay | Admitting: Internal Medicine

## 2021-01-25 ENCOUNTER — Other Ambulatory Visit (HOSPITAL_COMMUNITY): Payer: Self-pay

## 2021-01-25 MED ORDER — FOLIC ACID 1 MG PO TABS
ORAL_TABLET | Freq: Every day | ORAL | 1 refills | Status: AC
  Filled 2021-01-25: qty 180, 90d supply, fill #0
  Filled 2021-03-23 (×2): qty 180, 90d supply, fill #1

## 2021-01-27 ENCOUNTER — Other Ambulatory Visit (HOSPITAL_COMMUNITY): Payer: Self-pay

## 2021-02-07 ENCOUNTER — Other Ambulatory Visit (HOSPITAL_COMMUNITY): Payer: Self-pay

## 2021-02-07 MED FILL — Adalimumab Prefilled Syringe Kit 40 MG/0.4ML: SUBCUTANEOUS | 28 days supply | Qty: 2 | Fill #1 | Status: CN

## 2021-02-08 ENCOUNTER — Other Ambulatory Visit (HOSPITAL_COMMUNITY): Payer: Self-pay

## 2021-02-08 MED FILL — Adalimumab Prefilled Syringe Kit 40 MG/0.4ML: SUBCUTANEOUS | 28 days supply | Qty: 2 | Fill #1 | Status: AC

## 2021-02-16 ENCOUNTER — Other Ambulatory Visit (HOSPITAL_COMMUNITY): Payer: Self-pay

## 2021-02-16 DIAGNOSIS — H52223 Regular astigmatism, bilateral: Secondary | ICD-10-CM | POA: Diagnosis not present

## 2021-02-16 DIAGNOSIS — H5203 Hypermetropia, bilateral: Secondary | ICD-10-CM | POA: Diagnosis not present

## 2021-02-16 DIAGNOSIS — Z79899 Other long term (current) drug therapy: Secondary | ICD-10-CM | POA: Diagnosis not present

## 2021-02-16 DIAGNOSIS — M0589 Other rheumatoid arthritis with rheumatoid factor of multiple sites: Secondary | ICD-10-CM | POA: Diagnosis not present

## 2021-02-16 DIAGNOSIS — H524 Presbyopia: Secondary | ICD-10-CM | POA: Diagnosis not present

## 2021-02-16 DIAGNOSIS — M150 Primary generalized (osteo)arthritis: Secondary | ICD-10-CM | POA: Diagnosis not present

## 2021-02-16 DIAGNOSIS — H2513 Age-related nuclear cataract, bilateral: Secondary | ICD-10-CM | POA: Diagnosis not present

## 2021-02-16 DIAGNOSIS — Z6841 Body Mass Index (BMI) 40.0 and over, adult: Secondary | ICD-10-CM | POA: Diagnosis not present

## 2021-02-16 MED ORDER — FOLIC ACID 1 MG PO TABS
2.0000 mg | ORAL_TABLET | Freq: Every day | ORAL | 3 refills | Status: DC
  Filled 2021-02-16: qty 180, 90d supply, fill #0

## 2021-02-16 MED ORDER — METHOTREXATE SODIUM (PF) CHEMO INJECTION 250 MG/10ML
INTRAMUSCULAR | 1 refills | Status: DC
  Filled 2021-03-24: qty 1, fill #0

## 2021-02-22 ENCOUNTER — Other Ambulatory Visit (HOSPITAL_COMMUNITY): Payer: Self-pay

## 2021-03-04 ENCOUNTER — Encounter (HOSPITAL_COMMUNITY): Payer: Self-pay | Admitting: *Deleted

## 2021-03-08 ENCOUNTER — Other Ambulatory Visit (HOSPITAL_COMMUNITY): Payer: Self-pay

## 2021-03-09 ENCOUNTER — Other Ambulatory Visit: Payer: Self-pay | Admitting: Pharmacist

## 2021-03-09 ENCOUNTER — Other Ambulatory Visit (HOSPITAL_COMMUNITY): Payer: Self-pay

## 2021-03-09 MED ORDER — HUMIRA (2 SYRINGE) 40 MG/0.4ML ~~LOC~~ PSKT: PREFILLED_SYRINGE | SUBCUTANEOUS | 5 refills | Status: DC

## 2021-03-09 MED ORDER — HUMIRA (2 SYRINGE) 40 MG/0.4ML ~~LOC~~ PSKT
PREFILLED_SYRINGE | SUBCUTANEOUS | 5 refills | Status: DC
  Filled 2021-03-09: qty 2, 28d supply, fill #0
  Filled 2021-04-07: qty 2, 28d supply, fill #1
  Filled 2021-05-05: qty 2, 28d supply, fill #2
  Filled 2021-06-03: qty 2, 28d supply, fill #3

## 2021-03-16 ENCOUNTER — Other Ambulatory Visit (HOSPITAL_COMMUNITY): Payer: Self-pay

## 2021-03-23 ENCOUNTER — Other Ambulatory Visit (HOSPITAL_COMMUNITY): Payer: Self-pay

## 2021-03-24 ENCOUNTER — Other Ambulatory Visit (HOSPITAL_COMMUNITY): Payer: Self-pay

## 2021-03-24 DIAGNOSIS — Z01818 Encounter for other preprocedural examination: Secondary | ICD-10-CM | POA: Diagnosis not present

## 2021-03-24 DIAGNOSIS — H2512 Age-related nuclear cataract, left eye: Secondary | ICD-10-CM | POA: Diagnosis not present

## 2021-03-25 ENCOUNTER — Other Ambulatory Visit (HOSPITAL_COMMUNITY): Payer: Self-pay

## 2021-03-30 ENCOUNTER — Other Ambulatory Visit (HOSPITAL_COMMUNITY): Payer: Self-pay

## 2021-03-30 MED ORDER — PREDNISOLONE ACETATE 1 % OP SUSP
OPHTHALMIC | 5 refills | Status: DC
  Filled 2021-03-30: qty 5, 21d supply, fill #0

## 2021-03-30 MED ORDER — OFLOXACIN 0.3 % OP SOLN
OPHTHALMIC | 1 refills | Status: DC
  Filled 2021-03-30: qty 5, 7d supply, fill #0

## 2021-04-04 DIAGNOSIS — H2512 Age-related nuclear cataract, left eye: Secondary | ICD-10-CM | POA: Diagnosis not present

## 2021-04-07 ENCOUNTER — Other Ambulatory Visit (HOSPITAL_COMMUNITY): Payer: Self-pay

## 2021-04-11 ENCOUNTER — Other Ambulatory Visit (HOSPITAL_COMMUNITY): Payer: Self-pay

## 2021-04-11 MED ORDER — PREDNISOLONE ACETATE 1 % OP SUSP
OPHTHALMIC | 5 refills | Status: DC
  Filled 2021-04-11: qty 5, 21d supply, fill #0

## 2021-04-11 MED ORDER — OFLOXACIN 0.3 % OP SOLN
OPHTHALMIC | 1 refills | Status: DC
  Filled 2021-04-11: qty 5, 7d supply, fill #0

## 2021-04-13 ENCOUNTER — Other Ambulatory Visit (HOSPITAL_COMMUNITY): Payer: Self-pay

## 2021-04-21 ENCOUNTER — Other Ambulatory Visit (HOSPITAL_COMMUNITY): Payer: Self-pay

## 2021-04-25 ENCOUNTER — Other Ambulatory Visit (HOSPITAL_COMMUNITY): Payer: Self-pay

## 2021-04-25 DIAGNOSIS — H25811 Combined forms of age-related cataract, right eye: Secondary | ICD-10-CM | POA: Diagnosis not present

## 2021-04-25 DIAGNOSIS — Z961 Presence of intraocular lens: Secondary | ICD-10-CM | POA: Diagnosis not present

## 2021-04-25 DIAGNOSIS — H524 Presbyopia: Secondary | ICD-10-CM | POA: Diagnosis not present

## 2021-04-25 DIAGNOSIS — H2511 Age-related nuclear cataract, right eye: Secondary | ICD-10-CM | POA: Diagnosis not present

## 2021-04-26 ENCOUNTER — Other Ambulatory Visit (HOSPITAL_COMMUNITY): Payer: Self-pay

## 2021-04-27 ENCOUNTER — Other Ambulatory Visit (HOSPITAL_COMMUNITY): Payer: Self-pay

## 2021-04-27 MED ORDER — METHOTREXATE SODIUM CHEMO INJECTION (PF) 50 MG/2ML
INTRAMUSCULAR | 0 refills | Status: DC
  Filled 2021-04-27: qty 4, 28d supply, fill #0

## 2021-05-03 ENCOUNTER — Other Ambulatory Visit (HOSPITAL_COMMUNITY): Payer: Self-pay

## 2021-05-04 DIAGNOSIS — H2512 Age-related nuclear cataract, left eye: Secondary | ICD-10-CM | POA: Diagnosis not present

## 2021-05-05 ENCOUNTER — Other Ambulatory Visit (HOSPITAL_COMMUNITY): Payer: Self-pay

## 2021-05-15 ENCOUNTER — Other Ambulatory Visit (HOSPITAL_COMMUNITY): Payer: Self-pay

## 2021-05-16 ENCOUNTER — Other Ambulatory Visit (HOSPITAL_COMMUNITY): Payer: Self-pay

## 2021-05-16 MED ORDER — METHOTREXATE SODIUM CHEMO INJECTION (PF) 50 MG/2ML
INTRAMUSCULAR | 0 refills | Status: DC
  Filled 2021-05-16: qty 4, 14d supply, fill #0

## 2021-05-17 ENCOUNTER — Other Ambulatory Visit (HOSPITAL_COMMUNITY): Payer: Self-pay

## 2021-05-19 ENCOUNTER — Other Ambulatory Visit (HOSPITAL_COMMUNITY): Payer: Self-pay

## 2021-05-19 DIAGNOSIS — M0589 Other rheumatoid arthritis with rheumatoid factor of multiple sites: Secondary | ICD-10-CM | POA: Diagnosis not present

## 2021-06-03 ENCOUNTER — Other Ambulatory Visit (HOSPITAL_COMMUNITY): Payer: Self-pay

## 2021-06-06 ENCOUNTER — Other Ambulatory Visit (HOSPITAL_COMMUNITY): Payer: Self-pay

## 2021-06-08 ENCOUNTER — Other Ambulatory Visit (HOSPITAL_COMMUNITY): Payer: Self-pay

## 2021-06-13 ENCOUNTER — Other Ambulatory Visit (HOSPITAL_COMMUNITY): Payer: Self-pay

## 2021-06-22 ENCOUNTER — Other Ambulatory Visit (HOSPITAL_COMMUNITY): Payer: Self-pay

## 2021-06-22 ENCOUNTER — Ambulatory Visit: Payer: 59 | Admitting: Obstetrics and Gynecology

## 2021-06-22 MED ORDER — BUMETANIDE 1 MG PO TABS
1.0000 mg | ORAL_TABLET | Freq: Every day | ORAL | 3 refills | Status: DC
  Filled 2021-06-22: qty 30, 30d supply, fill #0

## 2021-07-28 ENCOUNTER — Telehealth: Payer: Self-pay | Admitting: Pharmacist

## 2021-07-28 NOTE — Telephone Encounter (Signed)
Called patient to schedule an appointment for the [facility]. I was unable to reach the patient so I left a HIPAA-compliant message requesting that the patient return my call.   Luke Van Ausdall, PharmD, BCACP, CPP Clinical Pharmacist [facility] [phone]  

## 2021-08-19 ENCOUNTER — Other Ambulatory Visit (HOSPITAL_COMMUNITY): Payer: Self-pay

## 2021-08-19 MED ORDER — ETODOLAC 400 MG PO TABS
400.0000 mg | ORAL_TABLET | Freq: Two times a day (BID) | ORAL | 1 refills | Status: DC
  Filled 2021-08-19: qty 60, 30d supply, fill #0

## 2021-08-30 ENCOUNTER — Other Ambulatory Visit (HOSPITAL_COMMUNITY): Payer: Self-pay

## 2021-09-26 ENCOUNTER — Ambulatory Visit: Payer: Self-pay | Admitting: Obstetrics and Gynecology

## 2021-12-19 ENCOUNTER — Ambulatory Visit: Payer: Self-pay | Admitting: Obstetrics and Gynecology

## 2022-03-10 ENCOUNTER — Encounter (HOSPITAL_COMMUNITY): Payer: Self-pay | Admitting: *Deleted

## 2022-06-08 ENCOUNTER — Encounter: Payer: Self-pay | Admitting: Obstetrics and Gynecology

## 2022-10-20 ENCOUNTER — Other Ambulatory Visit (HOSPITAL_COMMUNITY): Payer: Self-pay

## 2022-11-29 ENCOUNTER — Ambulatory Visit: Payer: Managed Care, Other (non HMO) | Admitting: Family Medicine

## 2022-11-29 ENCOUNTER — Encounter: Payer: Self-pay | Admitting: Family Medicine

## 2022-11-29 VITALS — BP 115/80 | HR 73 | Temp 97.6°F | Ht 65.0 in | Wt 339.0 lb

## 2022-11-29 DIAGNOSIS — M069 Rheumatoid arthritis, unspecified: Secondary | ICD-10-CM

## 2022-11-29 DIAGNOSIS — J3089 Other allergic rhinitis: Secondary | ICD-10-CM

## 2022-11-29 DIAGNOSIS — Z114 Encounter for screening for human immunodeficiency virus [HIV]: Secondary | ICD-10-CM | POA: Diagnosis not present

## 2022-11-29 DIAGNOSIS — Z6841 Body Mass Index (BMI) 40.0 and over, adult: Secondary | ICD-10-CM

## 2022-11-29 DIAGNOSIS — Z1211 Encounter for screening for malignant neoplasm of colon: Secondary | ICD-10-CM | POA: Diagnosis not present

## 2022-11-29 DIAGNOSIS — Z1159 Encounter for screening for other viral diseases: Secondary | ICD-10-CM

## 2022-11-29 DIAGNOSIS — Z7689 Persons encountering health services in other specified circumstances: Secondary | ICD-10-CM | POA: Insufficient documentation

## 2022-11-29 NOTE — Progress Notes (Signed)
New Patient Office Visit  Subjective    Patient ID: Sarah Dean, female    DOB: 1966-06-10  Age: 57 y.o. MRN: [id number]  CC: No chief complaint on file.   HPI Sarah Dean presents to establish care. Oriented to practice routines and expectations. PMH includes RA, lymphadema, seasonal allergies, anxiety, asthma, cataracts, and GERD. She is followed by Dr Amil Amen at [facility].   Mammograms- normal 2023, scheduled 2024 Cervical CA- total abdominal hysterectomy, GYN Colonoscopy 2021 polyps repeat 01/21/23 Vaccines declines Tobacco denies STD testing- declines BMI 56.41   Outpatient Encounter Medications as of 11/29/2022  Medication Sig   Adalimumab (HUMIRA) 40 MG/0.4ML PSKT INJECT 0.4 ML SUBCUTANEOUS EVERY OTHER WEEK   cetirizine (ZYRTEC) 10 MG tablet Take 10 mg by mouth daily.   etodolac (LODINE) 400 MG tablet Take 1 tablet (400 mg total) by mouth 2 (two) times daily.   Multiple Vitamin (MULTIVITAMIN IRON-FREE) TABS    triamcinolone ointment (KENALOG) 0.1 % SMARTSIG:Patch(s) Topical 1 to 2 Times Daily PRN   [DISCONTINUED] bumetanide (BUMEX) 1 MG tablet Take 1 tablet by mouth daily as needed (fluid). occ   [DISCONTINUED] bumetanide (BUMEX) 1 MG tablet Take 1 tablet by mouth daily   [DISCONTINUED] folic acid (FOLVITE) 1 MG tablet Take 2 mg by mouth daily.    [DISCONTINUED] folic acid (FOLVITE) 1 MG tablet Take 2 tablets (2 mg total) by mouth daily.   [DISCONTINUED] Methotrexate Sodium (METHOTREXATE, PF,) 50 MG/2ML injection INJECT 0.8 MLS WEEKLY. SINGLE USE VIAL. DISCARD AFTER USE.   [DISCONTINUED] Methotrexate Sodium (METHOTREXATE, PF,) 50 MG/2ML injection Inject 0.8 ml Injection weekly.  Preservative-free discard vial after single use.   [DISCONTINUED] ofloxacin (OCUFLOX) 0.3 % ophthalmic solution instill 1 drop into the left eye 4 times for 1 week, then stop   [DISCONTINUED] ofloxacin (OCUFLOX) 0.3 % ophthalmic solution Place 1 drop into right eye 4 times a day for 1 week  then stop   [DISCONTINUED] prednisoLONE acetate (PRED FORTE) 1 % ophthalmic suspension instill 1 drop into left eye 4 times a day for 1 wk, then 2  times a day for 1 wk, then daily for 1 wk the STOP   [DISCONTINUED] prednisoLONE acetate (PRED FORTE) 1 % ophthalmic suspension Place 1 drop into right eye 4 times a day for 1 week, then 2  times a day for 1 week, then daily for 1 week the STOP   [DISCONTINUED] UNABLE TO FIND daily. Bariatric multivitamin   No facility-administered encounter medications on file as of 11/29/2022.    Past Medical History:  Diagnosis Date   Abnormal Pap smear of cervix    yrs ago   Adenomatous colon polyp    Allergy    seasonal    Anemia    past hx as young woman    Bursitis of shoulder, right 2021   Cataract    forming    Condyloma    Hemorrhoids    Morbid obesity ([place])    Pneumonia    hx of    Rheumatoid arthritis ([place]) 2005   STD (sexually transmitted disease)    + trich   Tubal ectopic pregnancy    times 2    Past Surgical History:  Procedure Laterality Date   ANKLE SURGERY     CHOLECYSTECTOMY  04/25/07   COLONOSCOPY N/A 03/24/2015   [id number] external hemorrhoids/moderate sized internal hemorrhoids/moderate diverticulosis in the sigmoid colon   COLONOSCOPY     HEMORRHOID BANDING N/A 03/24/2015   Procedure: HEMORRHOID BANDING;  Surgeon: Danie Binder, MD;  [facility];  Service: Endoscopy;  Laterality: N/A;   LAPAROSCOPIC GASTRIC SLEEVE RESECTION N/A 08/13/2017   Procedure: LAPAROSCOPIC GASTRIC SLEEVE RESECTION, UPPER ENDOSCOPY HIATAL HERNIA REPAIR;  Surgeon: Kinsinger, [name], MD;  Location: [facility];  Service: General;  Laterality: N/A;   POLYPECTOMY     TOTAL ABDOMINAL HYSTERECTOMY  04/25/07   BSO   WISDOM TOOTH EXTRACTION      Family History  Problem Relation Age of Onset   Cancer Father    Cirrhosis Father    Esophageal cancer Father    Atrial fibrillation Mother    Colon cancer Neg Hx    Colon polyps Neg Hx    Rectal  cancer Neg Hx    Stomach cancer Neg Hx     Social History   Socioeconomic History   Marital status: Married    Spouse name: Not on file   Number of children: Not on file   Years of education: Not on file   Highest education level: Not on file  Occupational History   Not on file  Tobacco Use   Smoking status: Former   Smokeless tobacco: Never   Tobacco comments:    in high school  Vaping Use   Vaping Use: Never used  Substance and Sexual Activity   Alcohol use: No    Comment: rarely   Drug use: Yes    Types: Marijuana    Comment: occ   Sexual activity: Yes    Partners: Male    Birth control/protection: Surgical    Comment: TAH  Other Topics Concern   Not on file  Social History Narrative   Not on file   Social Determinants of Health   Financial Resource Strain: Not on file  Food Insecurity: No Food Insecurity (10/04/2017)   Hunger Vital Sign    Worried About Running Out of Food in the Last Year: Never true    Ran Out of Food in the Last Year: Never true  Transportation Needs: Not on file  Physical Activity: Not on file  Stress: Not on file  Social Connections: Not on file  Intimate Partner Violence: Not on file    Review of Systems  Constitutional: Negative.   HENT: Negative.    Eyes: Negative.   Respiratory: Negative.    Cardiovascular:  Positive for leg swelling.  Gastrointestinal: Negative.   Genitourinary: Negative.   Musculoskeletal: Negative.   Skin: Negative.   Neurological: Negative.   Endo/Heme/Allergies: Negative.   Psychiatric/Behavioral: Negative.    All other systems reviewed and are negative.       Objective    BP 115/80   Pulse 73   Temp 97.6 F (36.4 C) (Oral)   Ht 5' 5"$  (1.651 m)   Wt (!) 339 lb (153.8 kg)   LMP 04/25/2007   SpO2 97%   BMI 56.41 kg/m   Physical Exam Vitals and nursing note reviewed.  Constitutional:      Appearance: Normal appearance. She is obese.  HENT:     Head: Normocephalic and atraumatic.   Cardiovascular:     Rate and Rhythm: Normal rate and regular rhythm.     Pulses: Normal pulses.     Heart sounds: Normal heart sounds.  Pulmonary:     Effort: Pulmonary effort is normal.     Breath sounds: Normal breath sounds.  Skin:    General: Skin is warm and dry.     Capillary Refill: Capillary refill takes less than  2 seconds.  Neurological:     General: No focal deficit present.     Mental Status: She is alert and oriented to person, place, and time. Mental status is at baseline.  Psychiatric:        Mood and Affect: Mood normal.        Behavior: Behavior normal.        Thought Content: Thought content normal.        Judgment: Judgment normal.         Assessment & Plan:   Problem List Items Addressed This Visit       Musculoskeletal and Integument   Rheumatoid arthritis involving both knees, unspecified whether rheumatoid factor present ([place])     Other   Encounter to establish care with new doctor - Primary    Today we reviewed your medical history and current concerns. Routine fasting labs were drawn and will be reviewed. Referral placed to medical weight management as we discussed as well as dermatology and allergy clinic per your request. Routine health maintenance items were reviewed and addressed. We discussed the importance of 150 minutes of moderate intensity exercise weekly and consuming a well-balanced calorie restricted diet. Vaccine maintenance discussed. Return to office for fasting annual physical exam.       Class 3 severe obesity due to excess calories without serious comorbidity with body mass index (BMI) of 50.0 to 59.9 in adult [facility])   Relevant Orders   CBC with Differential/Platelet   COMPLETE METABOLIC PANEL WITH GFR   Lipid panel   TSH   Amb Ref to Medical Weight Management   Other Visit Diagnoses     Colon cancer screening       Relevant Orders   Ambulatory referral to Gastroenterology   Screening for HIV (human immunodeficiency virus)        Relevant Orders   HIV Antibody (routine testing w rflx)   Need for hepatitis C screening test       Relevant Orders   Hepatitis C antibody   Environmental and seasonal allergies       Relevant Orders   Ambulatory referral to Allergy       Return for annual physical.   Rubie Maid, FNP

## 2022-11-29 NOTE — Addendum Note (Signed)
Addended by: Rubie Maid on: 11/29/2022 03:11 PM   Modules accepted: Level of Service

## 2022-11-29 NOTE — Assessment & Plan Note (Addendum)
Today we reviewed your medical history and current concerns. Routine fasting labs were drawn and will be reviewed. Referral placed to medical weight management as we discussed as well as dermatology and allergy clinic per your request. Routine health maintenance items were reviewed and addressed. We discussed the importance of 150 minutes of moderate intensity exercise weekly and consuming a well-balanced calorie restricted diet. Vaccine maintenance discussed. Return to office for fasting annual physical exam.

## 2022-11-29 NOTE — Patient Instructions (Signed)
It was great to meet you today and I'm excited to have you join the [place] practice. I hope you had a positive experience today! If you feel so inclined, please feel free to recommend our practice to friends and family. Mila Merry, FNP-C

## 2022-11-30 LAB — COMPLETE METABOLIC PANEL WITH GFR
AG Ratio: 1.3 (calc) (ref 1.0–2.5)
ALT: 22 U/L (ref 6–29)
AST: 19 U/L (ref 10–35)
Albumin: 4 g/dL (ref 3.6–5.1)
Alkaline phosphatase (APISO): 65 U/L (ref 37–153)
BUN: 15 mg/dL (ref 7–25)
CO2: 27 mmol/L (ref 20–32)
Calcium: 9 mg/dL (ref 8.6–10.4)
Chloride: 105 mmol/L (ref 98–110)
Creat: 0.59 mg/dL (ref 0.50–1.03)
Globulin: 3 g/dL (calc) (ref 1.9–3.7)
Glucose, Bld: 85 mg/dL (ref 65–99)
Potassium: 4.5 mmol/L (ref 3.5–5.3)
Sodium: 141 mmol/L (ref 135–146)
Total Bilirubin: 0.8 mg/dL (ref 0.2–1.2)
Total Protein: 7 g/dL (ref 6.1–8.1)
eGFR: 107 mL/min/{1.73_m2} (ref >=60)

## 2022-11-30 LAB — CBC WITH DIFFERENTIAL/PLATELET
Absolute Monocytes: 440 cells/uL (ref 200–950)
Basophils Absolute: 32 cells/uL (ref 0–200)
Basophils Relative: 0.6 %
Eosinophils Absolute: 260 cells/uL (ref 15–500)
Eosinophils Relative: 4.9 %
HCT: 41.1 % (ref 35.0–45.0)
Hemoglobin: 13.7 g/dL (ref 11.7–15.5)
Lymphs Abs: 1564 cells/uL (ref 850–3900)
MCH: 29 pg (ref 27.0–33.0)
MCHC: 33.3 g/dL (ref 32.0–36.0)
MCV: 86.9 fL (ref 80.0–100.0)
MPV: 9.6 fL (ref 7.5–12.5)
Monocytes Relative: 8.3 %
Neutro Abs: 3005 cells/uL (ref 1500–7800)
Neutrophils Relative %: 56.7 %
Platelets: 234 10*3/uL (ref 140–400)
RBC: 4.73 10*6/uL (ref 3.80–5.10)
RDW: 13.5 % (ref 11.0–15.0)
Total Lymphocyte: 29.5 %
WBC: 5.3 10*3/uL (ref 3.8–10.8)

## 2022-11-30 LAB — LIPID PANEL
Cholesterol: 168 mg/dL (ref <200)
HDL: 48 mg/dL — ABNORMAL LOW (ref >=50)
LDL Cholesterol (Calc): 99 mg/dL (calc)
Non-HDL Cholesterol (Calc): 120 mg/dL (calc) (ref <130)
Total CHOL/HDL Ratio: 3.5 (calc) (ref <5.0)
Triglycerides: 115 mg/dL (ref <150)

## 2022-11-30 LAB — HIV ANTIBODY (ROUTINE TESTING W REFLEX): HIV 1&2 Ab, 4th Generation: NONREACTIVE

## 2022-11-30 LAB — TSH: TSH: 2.7 mIU/L

## 2022-11-30 LAB — HEPATITIS C ANTIBODY: Hepatitis C Ab: NONREACTIVE

## 2022-12-06 ENCOUNTER — Encounter: Payer: Self-pay | Admitting: Gastroenterology

## 2022-12-14 ENCOUNTER — Encounter: Payer: Self-pay | Admitting: Radiology

## 2022-12-18 ENCOUNTER — Telehealth: Payer: Self-pay

## 2022-12-18 NOTE — Telephone Encounter (Signed)
Just a quick FYI. Pt BMI 56.41 as of 11/29/22.  Pt will need to be scheduled at either [facility] or [facility]. Please advise on OV or direct to hospital? PV and colonoscopy will be cancelled at this time until hospital apt is confirmed.

## 2022-12-18 NOTE — Telephone Encounter (Signed)
Hey [name]. This pts BMI over 50. Dr. Tarri Glenn is ok with direct to the hospital. I have already called the pt and let her know I would be cancelling her PV and colonoscopy she understands the reasoning behind it. I also let her know you'd reach out to her with an apt time as well as a new PV apt once available. Thank you.

## 2022-12-19 ENCOUNTER — Telehealth: Payer: Self-pay

## 2022-12-19 NOTE — Telephone Encounter (Signed)
Hey [name]. I am not sure if you seen this message to due to the TE being closed. Just rerouting to be sure this got thru to yo.    This pts BMI over 50. Dr. Tarri Glenn is ok with direct to the hospital. I have already called the pt and let her know I would be cancelling her PV and colonoscopy she understands the reasoning behind it. I also let her know you'd reach out to her with an apt time as well as a new PV apt once available. Thank you.

## 2022-12-19 NOTE — Telephone Encounter (Signed)
Yes, I have her on my list thank you

## 2022-12-20 ENCOUNTER — Other Ambulatory Visit: Payer: Self-pay

## 2022-12-20 DIAGNOSIS — Z1211 Encounter for screening for malignant neoplasm of colon: Secondary | ICD-10-CM

## 2022-12-20 NOTE — Telephone Encounter (Signed)
Pre visit and colon has been scheduled with Dr Lorenso Courier on 5/9 at 9 am and 03/01/23 at [facility] at 19 am The patient has been notified of this information and all questions answered.

## 2022-12-25 ENCOUNTER — Ambulatory Visit: Payer: Managed Care, Other (non HMO) | Admitting: Internal Medicine

## 2022-12-25 ENCOUNTER — Encounter: Payer: Self-pay | Admitting: Internal Medicine

## 2022-12-25 ENCOUNTER — Other Ambulatory Visit: Payer: Self-pay

## 2022-12-25 VITALS — BP 132/74 | HR 90 | Temp 97.4°F | Resp 20 | Ht 65.0 in | Wt 335.4 lb

## 2022-12-25 DIAGNOSIS — J3089 Other allergic rhinitis: Secondary | ICD-10-CM | POA: Diagnosis not present

## 2022-12-25 DIAGNOSIS — J302 Other seasonal allergic rhinitis: Secondary | ICD-10-CM | POA: Diagnosis not present

## 2022-12-25 DIAGNOSIS — J343 Hypertrophy of nasal turbinates: Secondary | ICD-10-CM

## 2022-12-25 MED ORDER — AZELASTINE HCL 0.1 % NA SOLN: 1.0000 | Freq: Two times a day (BID) | NASAL | 5 refills | Status: DC | PRN

## 2022-12-25 MED ORDER — FLUTICASONE PROPIONATE 50 MCG/ACT NA SUSP: 2.0000 | Freq: Every day | NASAL | 5 refills | Status: DC

## 2022-12-25 MED ORDER — CETIRIZINE HCL 10 MG PO TABS: 10.0000 mg | ORAL_TABLET | Freq: Every day | ORAL | 5 refills | Status: DC

## 2022-12-25 MED ORDER — OLOPATADINE HCL 0.2 % OP SOLN: 1.0000 [drp] | Freq: Every day | OPHTHALMIC | 5 refills | Status: DC | PRN

## 2022-12-25 NOTE — Patient Instructions (Addendum)
Allergic Rhinitis: - Positive skin test 12/2022: trees, grasses, weeds, molds, dust mite, cats, dogs, horse, feathers, mouse  - Avoidance measures discussed. - Use nasal saline rinses before nose sprays such as with Neilmed Sinus Rinse.  Use distilled water.   - Use Flonase 2 sprays each nostril daily. Aim upward and outward. - Use Azelastine 1-2 sprays each nostril twice daily as needed. Aim upward and outward. - Use Zyrtec 10 mg daily.  - For eyes, use Olopatadine or Ketotifen 1 eye drop daily as needed for itchy, watery eyes.  Available over the counter, if not covered by insurance.    ALLERGEN AVOIDANCE MEASURES   Dust Mites Use central air conditioning and heat; and change the filter monthly.  Pleated filters work better than mesh filters.  Electrostatic filters may also be used; wash the filter monthly.  Window air conditioners may be used, but do not clean the air as well as a central air conditioner.  Change or wash the filter monthly. Keep windows closed.  Do not use attic fans.   Encase the mattress, box springs and pillows with zippered, dust proof covers. Wash the bed linens in hot water weekly.   Remove carpet, especially from the bedroom. Remove stuffed animals, throw pillows, dust ruffles, heavy drapes and other items that collect dust from the bedroom. Do not use a humidifier.   Use wood, vinyl or leather furniture instead of cloth furniture in the bedroom. Keep the indoor humidity at 30 - 40%.  Monitor with a humidity gauge.  Molds - Indoor avoidance Use air conditioning to reduce indoor humidity.  Do not use a humidifier. Keep indoor humidity at 30 - 40%.  Use a dehumidifier if needed. In the bathroom use an exhaust fan or open a window after showering.  Wipe down damp surfaces after showering.  Clean bathrooms with a mold-killing solution (diluted bleach, or products like Tilex, etc) at least once a month. In the kitchen use an exhaust fan to remove steam from cooking.   Throw away spoiled foods immediately, and empty garbage daily.  Empty water pans below self-defrosting refrigerators frequently. Vent the clothes dryer to the outside. Limit indoor houseplants; mold grows in the dirt.  No houseplants in the bedroom. Remove carpet from the bedroom. Encase the mattress and box springs with a zippered encasing.  Molds - Outdoor avoidance Avoid being outside when the grass is being mowed, or the ground is tilled. Avoid playing in leaves, pine straw, hay, etc.  Dead plant materials contain mold. Avoid going into barns or grain storage areas. Remove leaves, clippings and compost from around the home. Pollen Avoidance Pollen levels are highest during the mid-day and afternoon.  Consider this when planning outdoor activities. Avoid being outside when the grass is being mowed, or wear a mask if the pollen-allergic person must be the one to mow the grass. Keep the windows closed to keep pollen outside of the home. Use an air conditioner to filter the air. Take a shower, wash hair, and change clothing after working or playing outdoors during pollen season. Pet Dander Keep the pet out of your bedroom and restrict it to only a few rooms. Be advised that keeping the pet in only one room will not limit the allergens to that room. Don't pet, hug or kiss the pet; if you do, wash your hands with soap and water. High-efficiency particulate air (HEPA) cleaners run continuously in a bedroom or living room can reduce allergen levels over time. Regular use  of a high-efficiency vacuum cleaner or a central vacuum can reduce allergen levels. Giving your pet a bath at least once a week can reduce airborne allergen.

## 2022-12-25 NOTE — Progress Notes (Signed)
NEW PATIENT  Date of Service/Encounter:  12/25/22  Consult requested by: Rubie Maid, FNP   Subjective:   Sarah Dean (DOB: May 03, 1966) is a 57 y.o. female who presents to the clinic on 12/25/2022 with a chief complaint of Other (Pt thinks she has allergies to dogs gets itchy when she is around her dog .) .    History obtained from: chart review and patient.   Rhinitis:  Started since childhood. Symptoms include: nasal congestion, rhinorrhea, post nasal drainage, sneezing, watery eyes, and itchy eyes  She denies a history of asthma but does have cough with illness.  Also reports cough with post nasal drainage.    Nosebleeds intermittently; can occur from either nostril Occurs year-round Potential triggers: pets  Treatments tried:  Zyrtec PRN; last use was Thursday  Nettle OTC  Previous allergy testing: no History of reflux/heartburn: no History of sinus surgery: no Nonallergic triggers: none   Of note, she has RA and is on Humira.  Previously on MTX but stopped due to side effects.     Past Medical History: Past Medical History:  Diagnosis Date   Abnormal Pap smear of cervix    yrs ago   Adenomatous colon polyp    Allergy    seasonal    Anemia    past hx as young woman    Bursitis of shoulder, right 2021   Cataract    forming    Condyloma    Hemorrhoids    Morbid obesity ([place])    Pneumonia    hx of    Rheumatoid arthritis ([place]) 2005   STD (sexually transmitted disease)    + trich   Tubal ectopic pregnancy    times 2   Past Surgical History: Past Surgical History:  Procedure Laterality Date   ANKLE SURGERY     CHOLECYSTECTOMY  04/25/07   COLONOSCOPY N/A 03/24/2015   [id number] external hemorrhoids/moderate sized internal hemorrhoids/moderate diverticulosis in the sigmoid colon   COLONOSCOPY     HEMORRHOID BANDING N/A 03/24/2015   Procedure: HEMORRHOID BANDING;  Surgeon: Danie Binder, MD;  [facility];  Service: Endoscopy;   Laterality: N/A;   LAPAROSCOPIC GASTRIC SLEEVE RESECTION N/A 08/13/2017   Procedure: LAPAROSCOPIC GASTRIC SLEEVE RESECTION, UPPER ENDOSCOPY HIATAL HERNIA REPAIR;  Surgeon: Kinsinger, [name], MD;  Location: [facility];  Service: General;  Laterality: N/A;   POLYPECTOMY     TOTAL ABDOMINAL HYSTERECTOMY  04/25/07   BSO   WISDOM TOOTH EXTRACTION      Family History: Family History  Problem Relation Age of Onset   Cancer Father    Cirrhosis Father    Esophageal cancer Father    Atrial fibrillation Mother    Colon cancer Neg Hx    Colon polyps Neg Hx    Rectal cancer Neg Hx    Stomach cancer Neg Hx     Social History:  Lives in a 87 year house Pets: dog Tobacco use/exposure: none Job: tax Therapist, nutritional   Medication List:  Allergies as of 12/25/2022   No Known Allergies      Medication List        Accurate as of December 25, 2022 11:37 AM. If you have any questions, ask your nurse or doctor.          cetirizine 10 MG tablet Commonly known as: ZYRTEC Take 10 mg by mouth daily.   etodolac 400 MG tablet Commonly known as: LODINE Take 1 tablet (400 mg total) by mouth 2 (  two) times daily.   Humira (2 Syringe) 40 MG/0.4ML Pskt Generic drug: Adalimumab INJECT 0.4 ML SUBCUTANEOUS EVERY OTHER WEEK   Multivitamin Iron-Free Tabs   triamcinolone ointment 0.1 % Commonly known as: KENALOG SMARTSIG:Patch(s) Topical 1 to 2 Times Daily PRN         REVIEW OF SYSTEMS: Pertinent positives and negatives discussed in HPI.   Objective:   Physical Exam: BP 132/74   Pulse 90   Temp (!) 97.4 F (36.3 C)   Resp 20   Ht '5\' 5"'$  (1.651 m)   Wt (!) 335 lb 6.4 oz (152.1 kg)   LMP 04/25/2007   SpO2 98%   BMI 55.81 kg/m  Body mass index is 55.81 kg/m. GEN: alert, well developed HEENT: clear conjunctiva, TM grey and translucent, nose with + inferior turbinate hypertrophy, pink  nasal mucosa, slight clear rhinorrhea, + cobblestoning HEART: regular rate and rhythm, no murmur LUNGS:  clear to auscultation bilaterally, no coughing, unlabored respiration ABDOMEN: soft, non distended  SKIN: no rashes or lesions  Reviewed:  November 29, 2022 seen by Nadara Mustard NP for seasonal allergies and was referred to Allergist for further evaluation and management.  Using oral antihistamines.  June 14, 2022 seen by urgent care clinic for congestion, headaches, sinus pain, dry cough for 2 weeks.  Thought to be acute bacterial sinusitis started on Augmentin for 7 days.  Also given albuterol to use as needed for the cough.  July 16, 2020 seen by: [facility] pharmacist for medical management.  On Humira for rheumatoid arthritis managed by Dr. Amil Amen.    Skin Testing:  Skin prick testing was placed, which includes aeroallergens/foods, histamine control, and saline control.  Verbal consent was obtained prior to placing test.  Patient tolerated procedure well.  Allergy testing results were read and interpreted by myself, documented by clinical staff. Adequate positive and negative control.  Results discussed with patient/family.  Airborne Adult Perc - 12/25/22 1028     Time Antigen Placed 1028    Allergen Manufacturer Lavella Hammock    Location Back    Number of Test 59    1. Control-Buffer 50% Glycerol Negative    2. Control-Histamine 1 mg/ml 3+    3. Albumin saline Negative    4. [place] 2+    5. [place] 2+    6. [place] 3+    7. [place] Blue 3+    8. Meadow Fescue 3+    9. Perennial Rye 3+    10. Sweet Vernal 3+    11. Timothy 3+    12. Cocklebur Negative    13. Burweed Marshelder Negative    14. Ragweed, short Negative    15. Ragweed, Giant Negative    16. Plantain,  English 3+    17. Lamb's Quarters 3+    18. Sheep Sorrell 3+    19. Rough Pigweed 3+    20. Marsh Elder, Rough Negative    21. Mugwort, Common Negative    22. Ash mix Negative    23. Birch mix 3+    24. Beech American 3+    25. Box, Elder Negative    26. Cedar, red Negative    27. Cottonwood, Eastern 3+    28. Elm  mix Negative    29. Hickory Negative    30. Maple mix Negative    31. Oak, [place] mix 3+    32. Pecan Pollen Negative    33. Pine mix Negative    34. Sycamore Eastern Negative    35.  Iliff, Black Pollen Negative    36. Alternaria alternata 2+    37. Cladosporium Herbarum Negative    38. Aspergillus mix Negative    39. Penicillium mix Negative    40. Bipolaris sorokiniana (Helminthosporium) Negative    41. Drechslera spicifera (Curvularia) Negative    42. Mucor plumbeus Negative    43. Fusarium moniliforme Negative    44. Aureobasidium pullulans (pullulara) Negative    45. Rhizopus oryzae Negative    46. Botrytis cinera Negative    47. Epicoccum nigrum 3+    48. Phoma betae Negative    49. Candida Albicans Negative    50. Trichophyton mentagrophytes Negative    51. Mite, D Farinae  5,000 AU/ml 3+    52. Mite, D Pteronyssinus  5,000 AU/ml 3+    53. Cat Hair 10,000 BAU/ml 3+    54.  Dog Epithelia 3+    55. Mixed Feathers 3+    56. Horse Epithelia 3+    57. Cockroach, German Negative    58. Mouse 3+    59. Tobacco Leaf Negative               Assessment:   1. Seasonal and perennial allergic rhinitis   2. Nasal turbinate hypertrophy     Plan/Recommendations:   Allergic Rhinoconjunctivitis - Due to turbinate hypertrophy, seasonal symptoms and unresponsive to OTC meds, performed skin testing to identify aeroallergen triggers.   - Positive skin test 12/2022: trees, grasses, weeds, molds, dust mite, cats, dogs, horse, feathers, mouse  - Avoidance measures discussed. - Use nasal saline rinses before nose sprays such as with Neilmed Sinus Rinse.  Use distilled water.   - Use Flonase 2 sprays each nostril daily. Aim upward and outward. - Use Azelastine 1-2 sprays each nostril twice daily as needed. Aim upward and outward. - Use Zyrtec 10 mg daily.  - For eyes, use Olopatadine or Ketotifen 1 eye drop daily as needed for itchy, watery eyes.  Available over the counter, if  not covered by insurance.  - In setting of RA on Humira, discussed she would not make the best AIT candidate.     Return in about 6 weeks (around 02/05/2023).  Harlon Flor, MD Allergy and [facility]

## 2023-02-01 ENCOUNTER — Encounter: Payer: Managed Care, Other (non HMO) | Admitting: Gastroenterology

## 2023-02-02 DIAGNOSIS — M542 Cervicalgia: Secondary | ICD-10-CM | POA: Insufficient documentation

## 2023-02-19 ENCOUNTER — Telehealth: Payer: Self-pay | Admitting: *Deleted

## 2023-02-19 NOTE — Telephone Encounter (Signed)
Yesi,  This pt's BMI is greater than 50; their procedure will need to be performed at the hospital.  Thanks,  Willadean Guyton 

## 2023-02-21 ENCOUNTER — Encounter (HOSPITAL_COMMUNITY): Payer: Self-pay | Admitting: Internal Medicine

## 2023-02-22 ENCOUNTER — Encounter: Payer: Self-pay | Admitting: Internal Medicine

## 2023-02-22 ENCOUNTER — Ambulatory Visit (AMBULATORY_SURGERY_CENTER): Payer: Managed Care, Other (non HMO)

## 2023-02-22 VITALS — Ht 65.0 in | Wt 340.0 lb

## 2023-02-22 DIAGNOSIS — Z86010 Personal history of colonic polyps: Secondary | ICD-10-CM

## 2023-02-22 MED ORDER — NA SULFATE-K SULFATE-MG SULF 17.5-3.13-1.6 GM/177ML PO SOLN: 1.0000 | Freq: Once | ORAL | 0 refills | Status: AC

## 2023-02-22 NOTE — Progress Notes (Signed)
No egg or soy allergy known to patient  No issues known to pt with past sedation with any surgeries or procedures Patient denies ever being told they had issues or difficulty with intubation  No FH of Malignant Hyperthermia Pt is not on diet pills Pt is not on  home 02  Pt is not on blood thinners  Pt denies issues with constipation  No A fib or A flutter Have any cardiac testing pending--no  Pt is ambulatory    PV complete. Prep reviewed with patient. Instructions sent via mychart and to mailing address. Rx sent to [facility] on file. Goodrx coupon provided with paperwork.

## 2023-02-22 NOTE — Progress Notes (Signed)
Attempted to obtain medical history via telephone, unable to reach at this time. HIPAA compliant voicemail message left requesting return call to pre surgical testing department. 

## 2023-02-26 ENCOUNTER — Encounter: Payer: Self-pay | Admitting: Internal Medicine

## 2023-02-26 ENCOUNTER — Ambulatory Visit: Payer: Managed Care, Other (non HMO) | Admitting: Internal Medicine

## 2023-02-26 ENCOUNTER — Other Ambulatory Visit: Payer: Self-pay

## 2023-02-26 VITALS — BP 140/82 | HR 132 | Temp 98.4°F | Resp 20

## 2023-02-26 DIAGNOSIS — M47812 Spondylosis without myelopathy or radiculopathy, cervical region: Secondary | ICD-10-CM | POA: Insufficient documentation

## 2023-02-26 DIAGNOSIS — M5412 Radiculopathy, cervical region: Secondary | ICD-10-CM | POA: Insufficient documentation

## 2023-02-26 DIAGNOSIS — J3089 Other allergic rhinitis: Secondary | ICD-10-CM | POA: Diagnosis not present

## 2023-02-26 DIAGNOSIS — M792 Neuralgia and neuritis, unspecified: Secondary | ICD-10-CM | POA: Insufficient documentation

## 2023-02-26 DIAGNOSIS — J069 Acute upper respiratory infection, unspecified: Secondary | ICD-10-CM

## 2023-02-26 DIAGNOSIS — J302 Other seasonal allergic rhinitis: Secondary | ICD-10-CM

## 2023-02-26 MED ORDER — FLUTICASONE PROPIONATE 50 MCG/ACT NA SUSP: 2.0000 | Freq: Every day | NASAL | 5 refills | Status: DC

## 2023-02-26 MED ORDER — CETIRIZINE HCL 10 MG PO TABS: 10.0000 mg | ORAL_TABLET | Freq: Every day | ORAL | 5 refills | Status: DC

## 2023-02-26 MED ORDER — OLOPATADINE HCL 0.2 % OP SOLN: 1.0000 [drp] | Freq: Every day | OPHTHALMIC | 5 refills | Status: AC | PRN

## 2023-02-26 MED ORDER — AZELASTINE HCL 0.1 % NA SOLN: 1.0000 | Freq: Two times a day (BID) | NASAL | 5 refills | Status: DC | PRN

## 2023-02-26 NOTE — Progress Notes (Signed)
FOLLOW UP Date of Service/Encounter:  02/26/23   Subjective:  Sarah Dean (DOB: 1966-03-28) is a 57 y.o. female who [facility] for follow up for allergic rhinitis.   History obtained from: chart review and patient. Last visit was with me 12/25/2022 and was SPT positive to multiple allergens.  Started on Flonase, Azelastine, Zyrtec and Olopatadine.   Discussed doing well since last visit. Started using Flonase daily and had trouble with using Azelastine as it felt like it would just rundown her throat.  Felt like her nose was more open and she was breathing better and having less drainage.  About a week ago, she did pick something up from her step daughter who got sick while working in a nursing home. Had low grade fevers and went to urgent care and discussed using cough meds OTC without much improvement. She saw Rheumatology today and was started on Azithromycin as she has had a lot of cough with green mucous production for over a week now.  She is also getting a colonoscopy on Thursday. Has held her Humira.   Past Medical History: Past Medical History:  Diagnosis Date   Abnormal Pap smear of cervix    yrs ago   Adenomatous colon polyp    Allergy    seasonal    Anemia    past hx as young woman    Bursitis of shoulder, right 2021   Cataract    forming    Condyloma    GERD (gastroesophageal reflux disease)    Hemorrhoids    Morbid obesity (HCC)    Pneumonia    hx of    Rheumatoid arthritis (HCC) 2005   STD (sexually transmitted disease)    + trich   Tubal ectopic pregnancy    times 2    Objective:  BP (!) 140/82   Pulse (!) 132   Temp 98.4 F (36.9 C)   Resp 20   LMP 04/25/2007   SpO2 95%  There is no height or weight on file to calculate BMI. Physical Exam: GEN: alert, well developed HEENT: clear conjunctiva, TM grey and translucent, nose with mild inferior turbinate hypertrophy, boggy nasal mucosa, clear rhinorrhea,  slight erythema of posterior OP but no exudates  HEART: regular rate and rhythm, no murmur LUNGS: clear to auscultation bilaterally, no coughing, unlabored respiration SKIN: no rashes or lesions  Assessment:   1. Seasonal and perennial allergic rhinitis   2. Viral URI with cough     Plan/Recommendations:  Allergic Rhinitis Upper Respiratory Infection with Cough  - Positive skin test 12/2022: trees, grasses, weeds, molds, dust mite, cats, dogs, horse, feathers, mouse  - Avoidance measures discussed. - Use nasal saline rinses before nose sprays such as with Neilmed Sinus Rinse.  Use distilled water.   - Use Flonase 2 sprays each nostril daily. Aim upward and outward. - Use Azelastine 1-2 sprays each nostril twice daily as needed. Aim upward and outward. - Use Zyrtec 10 mg daily.  - For eyes, use Olopatadine or Ketotifen 1 eye drop daily as needed for itchy, watery eyes.  Available over the counter, if not covered by insurance.  - In setting of RA on Humira, discussed she would not make the best AIT candidate.  - Given course of azithromycin by Rheumatology; okay to start this as it has been ongoing for 1 week and she is on Humira.  Did discuss this could be viral and to watch for a  few days but she has an upcoming colonoscopy this week.   - Use Mucinex as needed.      Return in about 6 months (around 08/29/2023).  Alesia Morin, MD [facility]

## 2023-02-26 NOTE — Patient Instructions (Addendum)
Allergic Rhinitis: - Positive skin test 12/2022: trees, grasses, weeds, molds, dust mite, cats, dogs, horse, feathers, mouse  - Avoidance measures discussed. - Use nasal saline rinses before nose sprays such as with Neilmed Sinus Rinse.  Use distilled water.   - Use Flonase 2 sprays each nostril daily. Aim upward and outward. - Use Azelastine 1-2 sprays each nostril twice daily as needed. Aim upward and outward. - Use Zyrtec 10 mg daily.  - For eyes, use Olopatadine or Ketotifen 1 eye drop daily as needed for itchy, watery eyes.  Available over the counter, if not covered by insurance.  - Start course of azithromycin.   - Use Mucinex as needed.  - In setting of RA on Humira, discussed she would not make the best AIT candidate.

## 2023-03-01 ENCOUNTER — Other Ambulatory Visit: Payer: Self-pay

## 2023-03-01 ENCOUNTER — Ambulatory Visit (HOSPITAL_COMMUNITY)
Admission: RE | Admit: 2023-03-01 | Discharge: 2023-03-01 | Disposition: A | Discharge status: Home / Self Care | Payer: Managed Care, Other (non HMO) | Attending: Internal Medicine | Admitting: Internal Medicine

## 2023-03-01 ENCOUNTER — Encounter (HOSPITAL_COMMUNITY): Payer: Self-pay | Admitting: Internal Medicine

## 2023-03-01 ENCOUNTER — Ambulatory Visit (HOSPITAL_COMMUNITY): Payer: Managed Care, Other (non HMO) | Admitting: Certified Registered Nurse Anesthetist

## 2023-03-01 ENCOUNTER — Encounter (HOSPITAL_COMMUNITY)
Admission: RE | Disposition: A | Discharge status: Home / Self Care | Payer: Self-pay | Source: Home / Self Care | Attending: Internal Medicine

## 2023-03-01 DIAGNOSIS — Z87891 Personal history of nicotine dependence: Secondary | ICD-10-CM | POA: Insufficient documentation

## 2023-03-01 DIAGNOSIS — Z860100 Personal history of colon polyps, unspecified: Secondary | ICD-10-CM

## 2023-03-01 DIAGNOSIS — Z6841 Body Mass Index (BMI) 40.0 and over, adult: Secondary | ICD-10-CM | POA: Diagnosis not present

## 2023-03-01 DIAGNOSIS — D123 Benign neoplasm of transverse colon: Secondary | ICD-10-CM

## 2023-03-01 DIAGNOSIS — D126 Benign neoplasm of colon, unspecified: Secondary | ICD-10-CM | POA: Insufficient documentation

## 2023-03-01 DIAGNOSIS — M1990 Unspecified osteoarthritis, unspecified site: Secondary | ICD-10-CM | POA: Insufficient documentation

## 2023-03-01 DIAGNOSIS — K648 Other hemorrhoids: Secondary | ICD-10-CM | POA: Insufficient documentation

## 2023-03-01 DIAGNOSIS — K5730 Diverticulosis of large intestine without perforation or abscess without bleeding: Secondary | ICD-10-CM | POA: Insufficient documentation

## 2023-03-01 DIAGNOSIS — D630 Anemia in neoplastic disease: Secondary | ICD-10-CM

## 2023-03-01 DIAGNOSIS — K635 Polyp of colon: Secondary | ICD-10-CM | POA: Diagnosis not present

## 2023-03-01 DIAGNOSIS — D120 Benign neoplasm of cecum: Secondary | ICD-10-CM

## 2023-03-01 DIAGNOSIS — Z1211 Encounter for screening for malignant neoplasm of colon: Secondary | ICD-10-CM | POA: Diagnosis not present

## 2023-03-01 DIAGNOSIS — D122 Benign neoplasm of ascending colon: Secondary | ICD-10-CM | POA: Diagnosis not present

## 2023-03-01 DIAGNOSIS — Z09 Encounter for follow-up examination after completed treatment for conditions other than malignant neoplasm: Secondary | ICD-10-CM | POA: Diagnosis present

## 2023-03-01 HISTORY — PX: POLYPECTOMY: SHX5525

## 2023-03-01 HISTORY — PX: COLONOSCOPY WITH PROPOFOL: SHX5780

## 2023-03-01 SURGERY — COLONOSCOPY WITH PROPOFOL
Anesthesia: Monitor Anesthesia Care

## 2023-03-01 MED ORDER — PROPOFOL 500 MG/50ML IV EMUL
INTRAVENOUS | Status: DC | PRN
  Administered 2023-03-01: 50 mg via INTRAVENOUS
  Administered 2023-03-01: 100 ug/kg/min via INTRAVENOUS

## 2023-03-01 MED ORDER — LACTATED RINGERS IV SOLN
INTRAVENOUS | Status: DC
  Administered 2023-03-01: 1000 mL via INTRAVENOUS

## 2023-03-01 MED ORDER — LACTATED RINGERS IV SOLN: INTRAVENOUS | Status: DC | PRN

## 2023-03-01 MED ORDER — SODIUM CHLORIDE 0.9 % IV SOLN: INTRAVENOUS | Status: DC

## 2023-03-01 MED ORDER — DEXMEDETOMIDINE HCL IN NACL 80 MCG/20ML IV SOLN
INTRAVENOUS | Status: DC | PRN
  Administered 2023-03-01: 12 ug via INTRAVENOUS

## 2023-03-01 SURGICAL SUPPLY — 22 items

## 2023-03-01 NOTE — Anesthesia Preprocedure Evaluation (Signed)
Anesthesia Evaluation  Patient identified by MRN, date of birth, ID band Patient awake    Reviewed: Allergy & Precautions, NPO status , Patient's Chart, lab work & pertinent test results  Airway Mallampati: III  TM Distance: >3 FB Neck ROM: Full    Dental no notable dental hx.    Pulmonary neg pulmonary ROS, former smoker   Pulmonary exam normal        Cardiovascular negative cardio ROS  Rhythm:Regular Rate:Normal     Neuro/Psych negative neurological ROS  negative psych ROS   GI/Hepatic Neg liver ROS,GERD  ,,  Endo/Other    Morbid obesity  Renal/GU negative Renal ROS  negative genitourinary   Musculoskeletal  (+) Arthritis , Osteoarthritis,    Abdominal Normal abdominal exam  (+)   Peds  Hematology  (+) Blood dyscrasia, anemia   Anesthesia Other Findings   Reproductive/Obstetrics                             Anesthesia Physical Anesthesia Plan  ASA: 3  Anesthesia Plan: MAC   Post-op Pain Management:    Induction: Intravenous  PONV Risk Score and Plan: 2 and Propofol infusion and Treatment may vary due to age or medical condition  Airway Management Planned: Simple Face Mask and Nasal Cannula  Additional Equipment: None  Intra-op Plan:   Post-operative Plan:   Informed Consent: I have reviewed the patients History and Physical, chart, labs and discussed the procedure including the risks, benefits and alternatives for the proposed anesthesia with the patient or authorized representative who has indicated his/her understanding and acceptance.     Dental advisory given  Plan Discussed with:   Anesthesia Plan Comments:        Anesthesia Quick Evaluation

## 2023-03-01 NOTE — Discharge Instructions (Signed)

## 2023-03-01 NOTE — Transfer of Care (Signed)
Immediate Anesthesia Transfer of Care Note  Patient: Sarah Dean  Procedure(s) Performed: Procedure(s): COLONOSCOPY WITH PROPOFOL (N/A) POLYPECTOMY  Patient Location: PACU and Endoscopy Unit  Anesthesia Type:MAC  Level of Consciousness: awake, alert  and oriented  Airway & Oxygen Therapy: Patient Spontanous Breathing and Patient connected to nasal cannula oxygen  Post-op Assessment: Report given to RN and Post -op Vital signs reviewed and stable  Post vital signs: Reviewed and stable  Last Vitals:  Vitals:   03/01/23 0717  BP: 127/89  Pulse: 99  Resp: 20  Temp: (!) 36.4 C  SpO2: 94%    Complications: No apparent anesthesia complications

## 2023-03-01 NOTE — H&P (Signed)
GASTROENTEROLOGY PROCEDURE H&P NOTE   Primary Care Physician: Park Meo, FNP    Reason for Procedure:   History of colon polyps  Plan:    Colonoscopy  Patient is appropriate for endoscopic procedure(s) in the hospital setting.  The nature of the procedure, as well as the risks, benefits, and alternatives were carefully and thoroughly reviewed with the patient. Ample time for discussion and questions allowed. The patient understood, was satisfied, and agreed to proceed.     HPI: Sarah Dean is a 57 y.o. female who presents for colonoscopy for history of colon polyps. Her last colonoscopy was in 01/2020 with 3 small tubular adenomas that were removed.  Past Medical History:  Diagnosis Date   Abnormal Pap smear of cervix    yrs ago   Adenomatous colon polyp    Allergy    seasonal    Anemia    past hx as young woman    Bursitis of shoulder, right 2021   Cataract    forming    Condyloma    GERD (gastroesophageal reflux disease)    Hemorrhoids    Morbid obesity (HCC)    Pneumonia    hx of    Rheumatoid arthritis (HCC) 2005   STD (sexually transmitted disease)    + trich   Tubal ectopic pregnancy    times 2    Past Surgical History:  Procedure Laterality Date   ANKLE SURGERY     CHOLECYSTECTOMY  04/25/07   COLONOSCOPY N/A 03/24/2015   ZOX:WRUEA external hemorrhoids/moderate sized internal hemorrhoids/moderate diverticulosis in the sigmoid colon   COLONOSCOPY     HEMORRHOID BANDING N/A 03/24/2015   Procedure: HEMORRHOID BANDING;  Surgeon: West Bali, MD;  Location: [facility];  Service: Endoscopy;  Laterality: N/A;   LAPAROSCOPIC GASTRIC SLEEVE RESECTION N/A 08/13/2017   Procedure: LAPAROSCOPIC GASTRIC SLEEVE RESECTION, UPPER ENDOSCOPY HIATAL HERNIA REPAIR;  Surgeon: Kinsinger, De Blanch, MD;  Location: [facility] ORS;  Service: General;  Laterality: N/A;   POLYPECTOMY     TOTAL ABDOMINAL HYSTERECTOMY  04/25/07   BSO   WISDOM TOOTH EXTRACTION       Prior to Admission medications   Medication Sig Start Date End Date Taking? Authorizing Provider  acetaminophen (TYLENOL) 650 MG CR tablet Take 1,300 mg by mouth every 8 (eight) hours as needed for pain.   Yes [provider]  Adalimumab (HUMIRA) 40 MG/0.4ML PSKT INJECT 0.4 ML SUBCUTANEOUS EVERY OTHER WEEK 03/09/21  Yes Jegede, Olugbemiga E, MD  azelastine (ASTELIN) 0.1 % nasal spray Place 1 spray into both nostrils 2 (two) times daily as needed for rhinitis or allergies. Use in each nostril as directed Patient taking differently: Place 1 spray into both nostrils at bedtime. Use in each nostril as directed 02/26/23  Yes Patel, Priya P, MD  azithromycin (ZITHROMAX Z-PAK) 250 MG tablet Take 250-500 mg by mouth See admin instructions. Take 500 mg on day 1 then take 250 mg daily until finished 02/26/23  Yes [provider]  brompheniramine-pseudoephedrine-DM 30-2-10 MG/5ML syrup Take 5 mLs by mouth at bedtime as needed (cough). 02/21/23  Yes [provider]  cetirizine (ZYRTEC) 10 MG tablet Take 1 tablet (10 mg total) by mouth daily. 02/26/23  Yes Patel, Ellen Henri, MD  Cholecalciferol (VITAMIN D3 ULTRA STRENGTH) 125 MCG (5000 UT) capsule Take 5,000 Units by mouth daily.   Yes [provider]  Coenzyme Q10 (COQ-10) 200 MG CAPS Take 200 mg by mouth daily.   Yes [provider]  DANDELION PO Take 3 tablets by mouth daily.   Yes [provider]  DEVILS CLAW PO Take 2,000 mg by mouth daily.   Yes [provider]  fluticasone (FLONASE) 50 MCG/ACT nasal spray Place 2 sprays into both nostrils daily. 02/26/23  Yes Birder Robson, MD  Milk Thistle 1000 MG CAPS Take 1,000 mg by mouth daily.   Yes [provider]  Nettle, Urtica Dioica, (NETTLE LEAF PO) Take 4 capsules by mouth daily.   Yes [provider]  Olopatadine HCl 0.2 % SOLN Apply 1 drop to eye daily as needed (itchy watery eyes). 02/26/23  Yes Birder Robson, MD  OVER THE  COUNTER MEDICATION Take 2 tablets by mouth daily. Real clarity mushroom supplement   Yes [provider]  OVER THE COUNTER MEDICATION Take 2 tablets by mouth daily. 5 defenders supplement   Yes [provider]  OVER THE COUNTER MEDICATION Take 100 mg by mouth 2 (two) times daily. CBD capsules   Yes [provider]  Probiotic Product (ALIGN) 4 MG CAPS Take 4 mg by mouth daily.   Yes [provider]  TURMERIC CURCUMIN PO Take 1,600 mg by mouth daily.   Yes [provider]  albuterol (VENTOLIN HFA) 108 (90 Base) MCG/ACT inhaler Inhale 1-2 puffs into the lungs every 6 (six) hours as needed for wheezing or shortness of breath.    [provider]  etodolac (LODINE) 400 MG tablet Take 1 tablet (400 mg total) by mouth 2 (two) times daily. 08/19/21       Current Facility-Administered Medications  Medication Dose Route Frequency Provider Last Rate Last Admin   0.9 %  sodium chloride infusion   Intravenous Continuous Imogene Burn, MD       lactated ringers infusion   Intravenous Continuous Imogene Burn, MD 10 mL/hr at 03/01/23 0721 1,000 mL at 03/01/23 0721    Allergies as of 12/20/2022   (No Known Allergies)    Family History  Problem Relation Age of Onset   Cancer Father    Cirrhosis Father    Esophageal cancer Father    Atrial fibrillation Mother    Colon cancer Neg Hx    Colon polyps Neg Hx    Rectal cancer Neg Hx    Stomach cancer Neg Hx     Social History   Socioeconomic History   Marital status: Married    Spouse name: Not on file   Number of children: Not on file   Years of education: Not on file   Highest education level: Not on file  Occupational History   Not on file  Tobacco Use   Smoking status: Former   Smokeless tobacco: Never   Tobacco comments:    in high school  Vaping Use   Vaping Use: Never used  Substance and Sexual Activity   Alcohol use: No    Comment: rarely   Drug use: Yes    Types: Marijuana     Comment: occ   Sexual activity: Yes    Partners: Male    Birth control/protection: Surgical    Comment: TAH  Other Topics Concern   Not on file  Social History Narrative   Not on file   Social Determinants of Health   Financial Resource Strain: Not on file  Food Insecurity: No Food Insecurity (10/04/2017)   Hunger Vital Sign    Worried About Running Out of Food in the Last Year: Never true  Ran Out of Food in the Last Year: Never true  Transportation Needs: Not on file  Physical Activity: Not on file  Stress: Not on file  Social Connections: Not on file  Intimate Partner Violence: Not on file    Physical Exam: Vital signs in last 24 hours: BP 127/89   Pulse 99   Temp (!) 97.5 F (36.4 C) (Tympanic)   Resp 20   Ht 5\' 5"  (1.651 m)   Wt (!) 154.2 kg   LMP 04/25/2007   SpO2 94%   BMI 56.57 kg/m  GEN: NAD EYE: Sclerae anicteric ENT: MMM CV: Non-tachycardic Pulm: No increased work of breathing GI: Soft, NT/ND NEURO:  Alert & Oriented   Eulah Pont, MD [facility]  03/01/2023 7:49 AM

## 2023-03-01 NOTE — Anesthesia Postprocedure Evaluation (Signed)
Anesthesia Post Note  Patient: Sarah Dean  Procedure(s) Performed: COLONOSCOPY WITH PROPOFOL POLYPECTOMY     Patient location during evaluation: PACU Anesthesia Type: MAC Level of consciousness: awake and alert Pain management: pain level controlled Vital Signs Assessment: post-procedure vital signs reviewed and stable Respiratory status: spontaneous breathing, nonlabored ventilation, respiratory function stable and patient connected to nasal cannula oxygen Cardiovascular status: stable and blood pressure returned to baseline Postop Assessment: no apparent nausea or vomiting Anesthetic complications: no   No notable events documented.  Last Vitals:  Vitals:   03/01/23 0900 03/01/23 0910  BP: 100/69 114/69  Pulse: 88 78  Resp: 15 16  Temp:    SpO2: 100% 95%    Last Pain:  Vitals:   03/01/23 0910  TempSrc:   PainSc: 0-No pain                 Earl Lites P Onica Davidovich

## 2023-03-01 NOTE — Op Note (Signed)
[facility] Patient Name: Sarah Dean Procedure Date: 03/01/2023 MRN: [id number] Attending MD: Particia Lather , , [id number] Date of Birth: 1966/05/17 CSN: [id number] Age: 57 Admit Type: Outpatient Procedure:                Colonoscopy Indications:              High risk colon cancer surveillance: Personal                            history of colonic polyps Providers:                Madelyn Brunner" Janne Napoleon, Technician Referring MD:             Binnie Rail. Howard Medicines:                Monitored Anesthesia Care Complications:            No immediate complications. Estimated Blood Loss:     Estimated blood loss was minimal. Procedure:                Pre-Anesthesia Assessment:                           - Prior to the procedure, a History and Physical                            was performed, and patient medications and                            allergies were reviewed. The patient's tolerance of                            previous anesthesia was also reviewed. The risks                            and benefits of the procedure and the sedation                            options and risks were discussed with the patient.                            All questions were answered, and informed consent                            was obtained. Prior Anticoagulants: The patient has                            taken no anticoagulant or antiplatelet agents. ASA                            Grade Assessment: III - A patient with severe  systemic disease. After reviewing the risks and                            benefits, the patient was deemed in satisfactory                            condition to undergo the procedure.                           After obtaining informed consent, the colonoscope                            was passed under direct vision. Throughout the                             procedure, the patient's blood pressure, pulse, and                            oxygen saturations were monitored continuously. The                            [id number]-HQ190L ([id number]) Olympus colonoscope was                            introduced through the anus and advanced to the the                            terminal ileum. The colonoscopy was performed                            without difficulty. The patient tolerated the                            procedure well. The quality of the bowel                            preparation was good. The terminal ileum, ileocecal                            valve, appendiceal orifice, and rectum were                            photographed. Scope In: 8:31:40 AM Scope Out: 8:50:25 AM Scope Withdrawal Time: 0 hours 15 minutes 41 seconds  Total Procedure Duration: 0 hours 18 minutes 45 seconds  Findings:      The terminal ileum appeared normal.      Four sessile polyps were found in the transverse colon, ascending colon       and cecum. The polyps were 3 to 5 mm in size. These polyps were removed       with a cold snare. Resection and retrieval were complete.      Multiple diverticula were found in the sigmoid colon, descending colon       and transverse colon.      Non-bleeding internal hemorrhoids were found during retroflexion. Impression:               -  The examined portion of the ileum was normal.                           - Four 3 to 5 mm polyps in the transverse colon, in                            the ascending colon and in the cecum, removed with                            a cold snare. Resected and retrieved.                           - Diverticulosis in the sigmoid colon, in the                            descending colon and in the transverse colon.                           - Non-bleeding internal hemorrhoids. Moderate Sedation:      Not Applicable - Patient had care per Anesthesia. Recommendation:           - Discharge patient to  home (with escort).                           - Await pathology results.                           - The findings and recommendations were discussed                            with the patient. Procedure Code(s):        --- Professional ---                           [phone], Colonoscopy, flexible; with removal of                            tumor(s), polyp(s), or other lesion(s) by snare                            technique Diagnosis Code(s):        --- Professional ---                           K64.8, Other hemorrhoids                           D12.3, Benign neoplasm of transverse colon (hepatic                            flexure or splenic flexure)                           D12.2, Benign neoplasm of ascending colon  D12.0, Benign neoplasm of cecum                           K57.30, Diverticulosis of large intestine without                            perforation or abscess without bleeding CPT copyright 2022 [employer]. All rights reserved. The codes documented in this report are preliminary and upon coder review may  be revised to meet current compliance requirements. Dr Particia Lather "Alan Ripper" Leonides Schanz,  03/01/2023 8:59:09 AM Number of Addenda: 0

## 2023-03-05 ENCOUNTER — Encounter (HOSPITAL_COMMUNITY): Payer: Self-pay | Admitting: Internal Medicine

## 2023-03-05 LAB — SURGICAL PATHOLOGY

## 2023-03-06 ENCOUNTER — Encounter: Payer: Self-pay | Admitting: Internal Medicine

## 2023-03-09 ENCOUNTER — Encounter (HOSPITAL_COMMUNITY): Payer: Self-pay | Admitting: *Deleted

## 2023-07-25 ENCOUNTER — Encounter: Payer: Self-pay | Admitting: Allergy & Immunology

## 2023-07-25 ENCOUNTER — Other Ambulatory Visit: Payer: Self-pay

## 2023-07-25 ENCOUNTER — Ambulatory Visit: Payer: Managed Care, Other (non HMO) | Admitting: Allergy & Immunology

## 2023-07-25 VITALS — BP 130/80 | HR 121 | Temp 98.0°F | Resp 16 | Ht 65.0 in | Wt 346.8 lb

## 2023-07-25 DIAGNOSIS — J3089 Other allergic rhinitis: Secondary | ICD-10-CM

## 2023-07-25 DIAGNOSIS — L301 Dyshidrosis [pompholyx]: Secondary | ICD-10-CM

## 2023-07-25 DIAGNOSIS — J302 Other seasonal allergic rhinitis: Secondary | ICD-10-CM

## 2023-07-25 DIAGNOSIS — J4540 Moderate persistent asthma, uncomplicated: Secondary | ICD-10-CM

## 2023-07-25 DIAGNOSIS — L2089 Other atopic dermatitis: Secondary | ICD-10-CM | POA: Diagnosis not present

## 2023-07-25 MED ORDER — BREZTRI AEROSPHERE 160-9-4.8 MCG/ACT IN AERO: 2.0000 | INHALATION_SPRAY | Freq: Two times a day (BID) | RESPIRATORY_TRACT | 5 refills | Status: AC

## 2023-07-25 MED ORDER — AIRSUPRA 90-80 MCG/ACT IN AERO: 2.0000 | INHALATION_SPRAY | RESPIRATORY_TRACT | 5 refills | Status: DC | PRN

## 2023-07-25 MED ORDER — AZITHROMYCIN 250 MG PO TABS: ORAL_TABLET | ORAL | 0 refills | Status: DC

## 2023-07-25 NOTE — Progress Notes (Unsigned)
FOLLOW UP  Date of Service/Encounter:  07/25/23   Assessment:   Seasonal and perennial allergic rhinitis (grasses, weeds, trees, molds, dust mite, dog, cat, horse, feathers, mouse)  Rheumatoid arthritis - now on Simptoni  Multiple prednisone rounds due to controlled RA  Recurrent bronchitis   Plan/Recommendations:   Assessment and Plan    Allergic Rhinitis Worsening symptoms despite use of nasal sprays and antihistamines. Possible food triggers noted. -Perform breathing tests to assess current respiratory status. -Consider daily inhaler for symptom management.  Hand Eczema Chronic, intermittent flares with recent worsening. Previous treatment with topical cream provided partial relief. -Prescribe a topical steroid for symptom management. -Recommend follow-up with a dermatologist for further evaluation and treatment.  Rheumatoid Arthritis Currently managed with Simponi infusions. Recent change in medication due to insurance issues. -Continue current treatment plan. -Consider additional interventions if symptoms do not improve.  Weight Management History of significant weight loss and subsequent regain. Recent weight gain attributed to prednisone use and decreased mobility due to RA and respiratory issues. -Encourage continued efforts towards healthy diet and exercise as tolerated. -Consider referral to a dietitian or weight management program for additional support.         Patient Instructions  1. Seasonal and perennial allergic rhinitis - Continue with the Flonase one spray per nostril daily. - Continue with the Astelin one spray per nostril daily.  - Continue with Zyrtec 1-2 times daily. - We are going to start Singulair (montelukast) to help with your allergies and your breathing. - This targets another pathway in the allergic pathway to help with breathing and allergies.   2. Dyshidrotic eczema - Add on Opzelura twice daily on your hands. - This is not a  steroid and can be used safely over the entire body. - Sample of Opzelura provided.   4. Moderate persistent asthma, uncomplicated - Lung testing was in the low 60% range and did improve ever so slightly with the albuterol treatment.  - I am not convinced that this is related to asthma, but recurrent bronchitis is often untreated asthma. - Sample of Breztri provided (contains three medicines to help with coughing/wheezing). - Sample of AirSupra provided as well (contains albuterol plus a steroid to help the albuterol work more effectively).  - Spacer sample and teaching provided. - Daily controller medication(s): Breztri two puffs twice daily with spacer - Prior to physical activity: AirSupra two puff 10-15 minutes before physical activity. - Rescue medications: AirSupra two puffs every 4-6 hours as needed.  - Asthma control goals:  * Full participation in all desired activities (may need albuterol before activity) * Albuterol use two time or less a week on average (not counting use with activity) * Cough interfering with sleep two time or less a month * Oral steroids no more than once a year * No hospitalizations  5. Return in about 2 months (around 09/24/2023). You can have the follow up appointment with Dr. Dellis Anes or a Nurse Practicioner (our Nurse Practitioners are excellent and always have Physician oversight!).    Please inform us of any Emergency Department visits, hospitalizations, or changes in symptoms. Call us before going to the ED for breathing or allergy symptoms since we might be able to fit you in for a sick visit. Feel free to contact us anytime with any questions, problems, or concerns.  It was a pleasure to meet you today!  Websites that have reliable patient information: 1. American Academy of Asthma, Allergy, and Immunology: www.aaaai.org 2. Food Allergy Research  and Education (FARE): foodallergy.org 3. Mothers of Asthmatics:  http://www.asthmacommunitynetwork.org 4. American College of Allergy, Asthma, and Immunology: www.acaai.org   COVID-19 Vaccine Information can be found at: [email or web address] For questions related to vaccine distribution or appointments, please email [email or web address] or call [phone].     "Like" [place] on Facebook and Instagram for our latest updates!      A healthy democracy works best when [employer] participate! Make sure you are registered to vote! If you have moved or changed any of your contact information, you will need to get this updated before voting! Scan the QR codes below to learn more!             Subjective:   LEANORE BIGGERS is a 57 y.o. female presenting today for follow up of  Chief Complaint  Patient presents with  . Wheezing  . Breathing Problem    Feels as though she can not catch her breath. Think she may have a food allergy x 4days  . Cough    Mickel Baas has a history of the following: Patient Active Problem List   Diagnosis Date Noted  . History of colonic polyps 03/01/2023  . Neck pain 02/02/2023  . Rheumatoid arthritis involving both knees, unspecified whether rheumatoid factor present (HCC) 11/29/2022  . Encounter to establish care with new doctor 11/29/2022  . Class 3 severe obesity due to excess calories without serious comorbidity with body mass index (BMI) of 50.0 to 59.9 in adult (HCC) 11/29/2022  . Morbid obesity (HCC) 08/13/2017  . Pre-operative cardiovascular examination 05/24/2017  . Rectal discomfort 01/01/2015    History obtained from: chart review and {Persons; PED relatives w/patient:19415::"patient"}.  Discussed the use of AI scribe software for clinical note transcription with the patient and/or guardian, who gave verbal consent to proceed.  Tamia is a 57 y.o. female presenting for {Blank single:19197::"a food challenge","a drug challenge","skin  testing","a sick visit","an evaluation of ***","a follow up visit"}.  She was last seen in May 2024 by Dr. Allena Katz.  At that time, she was continued on nasal saline rinses as well as Flonase, Astelin, Zyrtec.  She was encouraged to use over-the-counter eyedrop.  Discussed the use of AI scribe software for clinical note transcription with the patient, who gave verbal consent to proceed.  History of Present Illness   The patient, with a history of allergies, eczema, and rheumatoid arthritis (RA), presents with worsening respiratory symptoms and skin manifestations. She reports a significant decline in the effectiveness of her nasal sprays, which previously provided substantial relief. The patient also notes a potential correlation between the consumption of cheese and the exacerbation of her respiratory symptoms, suggesting a possible food allergy.  The patient's skin condition, suspected to be eczema, has been progressively worsening. She describes the presence of small, fluid-filled bumps that intermittently flare up and recede. The skin on her hands is particularly affected, with severe dryness leading to cracking unless regularly moisturized and covered. The patient had previously consulted a dermatologist a few years ago and was prescribed a cream that provided partial relief but did not completely resolve the condition.  The patient's RA has also been a significant concern. She recently had to switch her medication due to insurance issues, transitioning from Humira to Simponi infusions. The patient has had two infusions so far, with the next one scheduled for early November. The change in medication, coupled with her respiratory and skin symptoms, has significantly impacted her quality of life, leading to weight gain due to reduced  physical activity.  The patient also mentions a history of cellulitis when she was significantly overweight, but this has not recurred since she lost weight. She has had  pneumonia twice and bronchitis multiple times, but these were several years ago. The patient also had a bout of COVID-19 at the end of the previous year. She denies any history of significant smoking, although she has recently started using cannabis to help manage her RA symptoms.        {Blank single:19197::"Asthma/Respiratory Symptom History: ***"," "}  {Blank single:19197::"Allergic Rhinitis Symptom History: ***"," "}  {Blank single:19197::"Food Allergy Symptom History: ***"," "}  {Blank single:19197::"Skin Symptom History: ***"," "}  {Blank single:19197::"GERD Symptom History: ***"," "}  Otherwise, there have been no changes to her past medical history, surgical history, family history, or social history.    Review of systems otherwise negative other than that mentioned in the HPI.    Objective:   Blood pressure 130/80, pulse (!) 121, temperature 98 F (36.7 C), resp. rate 16, height 5\' 5"  (1.651 m), weight (!) 346 lb 12.8 oz (157.3 kg), last menstrual period 04/25/2007, SpO2 95%. Body mass index is 57.71 kg/m.    Physical Exam   Diagnostic studies:    Spirometry: results abnormal (FEV1: 1.68/62%, FVC: 2.06/60%, FEV1/FVC: 82%).    Spirometry consistent with possible restrictive disease. Albuterol nebulizer treatment given in clinic with no improvement.  Allergy Studies: {Blank single:19197::"none","labs sent instead"," "}    {Blank single:19197::"Allergy testing results were read and interpreted by myself, documented by clinical staff."," "}      Malachi Bonds, MD  [facility]

## 2023-07-25 NOTE — Patient Instructions (Addendum)
1. Seasonal and perennial allergic rhinitis - Continue with the Flonase one spray per nostril daily. - Continue with the Astelin one spray per nostril daily.  - Continue with Zyrtec 1-2 times daily. - We are going to start Singulair (montelukast) to help with your allergies and your breathing. - This targets another pathway in the allergic pathway to help with breathing and allergies.   2. Dyshidrotic eczema - Add on Opzelura twice daily on your hands. - This is not a steroid and can be used safely over the entire body. - Sample of Opzelura provided.   4. Moderate persistent asthma, uncomplicated - Lung testing was in the low 60% range and did improve ever so slightly with the albuterol treatment.  - I am not convinced that this is related to asthma, but recurrent bronchitis is often untreated asthma. - Sample of Breztri provided (contains three medicines to help with coughing/wheezing). - Sample of AirSupra provided as well (contains albuterol plus a steroid to help the albuterol work more effectively).  - Spacer sample and teaching provided. - Daily controller medication(s): Breztri two puffs twice daily with spacer - Prior to physical activity: AirSupra two puff 10-15 minutes before physical activity. - Rescue medications: AirSupra two puffs every 4-6 hours as needed.  - Asthma control goals:  * Full participation in all desired activities (may need albuterol before activity) * Albuterol use two time or less a week on average (not counting use with activity) * Cough interfering with sleep two time or less a month * Oral steroids no more than once a year * No hospitalizations  5. Return in about 2 months (around 09/24/2023). You can have the follow up appointment with Dr. Dellis Anes or a Nurse Practicioner (our Nurse Practitioners are excellent and always have Physician oversight!).    Please inform us of any Emergency Department visits, hospitalizations, or changes in symptoms. Call  us before going to the ED for breathing or allergy symptoms since we might be able to fit you in for a sick visit. Feel free to contact us anytime with any questions, problems, or concerns.  It was a pleasure to meet you today!  Websites that have reliable patient information: 1. American Academy of Asthma, Allergy, and Immunology: www.aaaai.org 2. Food Allergy Research and Education (FARE): foodallergy.org 3. Mothers of Asthmatics: http://www.asthmacommunitynetwork.org 4. American College of Allergy, Asthma, and Immunology: www.acaai.org   COVID-19 Vaccine Information can be found at: [email or web address] For questions related to vaccine distribution or appointments, please email [email or web address] or call [phone].     "Like" [place] on Facebook and Instagram for our latest updates!      A healthy democracy works best when [employer] participate! Make sure you are registered to vote! If you have moved or changed any of your contact information, you will need to get this updated before voting! Scan the QR codes below to learn more!

## 2023-07-26 ENCOUNTER — Other Ambulatory Visit: Payer: Self-pay

## 2023-07-26 ENCOUNTER — Other Ambulatory Visit: Payer: Self-pay | Admitting: Allergy & Immunology

## 2023-07-26 ENCOUNTER — Encounter: Payer: Self-pay | Admitting: Allergy & Immunology

## 2023-07-26 MED ORDER — OPZELURA 1.5 % EX CREA: TOPICAL_CREAM | CUTANEOUS | 5 refills | Status: DC

## 2023-07-26 MED ORDER — MONTELUKAST SODIUM 10 MG PO TABS: 10.0000 mg | ORAL_TABLET | Freq: Every day | ORAL | 1 refills | Status: DC

## 2023-07-26 MED ORDER — AIRSUPRA 90-80 MCG/ACT IN AERO: 2.0000 | INHALATION_SPRAY | RESPIRATORY_TRACT | 5 refills | Status: DC | PRN

## 2023-07-28 LAB — ALPHA-GAL PANEL
Allergen Lamb IgE: 0.39 kU/L — AB
Beef IgE: 0.33 kU/L — AB
IgE (Immunoglobulin E), Serum: 60 [IU]/mL (ref 6–495)
O215-IgE Alpha-Gal: 1.22 kU/L — AB
Pork IgE: 0.61 kU/L — AB

## 2023-07-30 LAB — ALLERGEN,OAT,F7: Allergen Oat IgE: 0.37 kU/L — AB

## 2023-07-30 LAB — MILK COMPONENT PANEL
F076-IgE Alpha Lactalbumin: <0.1 kU/L
F077-IgE Beta Lactoglobulin: <0.1 kU/L
F078-IgE Casein: <0.1 kU/L

## 2023-08-01 ENCOUNTER — Encounter: Payer: Self-pay | Admitting: Allergy & Immunology

## 2023-08-01 DIAGNOSIS — T782XXD Anaphylactic shock, unspecified, subsequent encounter: Secondary | ICD-10-CM

## 2023-08-15 NOTE — Addendum Note (Signed)
Addended by: Alfonse Spruce on: 08/15/2023 05:24 AM   Modules accepted: Orders

## 2023-08-23 MED ORDER — EPINEPHRINE 0.3 MG/0.3ML IJ SOAJ: 0.3000 mg | Freq: Once | INTRAMUSCULAR | 2 refills | Status: AC

## 2023-08-23 NOTE — Addendum Note (Signed)
Addended by: Alfonse Spruce on: 08/23/2023 08:18 AM   Modules accepted: Orders

## 2023-08-27 ENCOUNTER — Ambulatory Visit: Payer: Managed Care, Other (non HMO) | Admitting: Internal Medicine

## 2023-08-30 NOTE — Telephone Encounter (Signed)
[facility] called stating the patient refused to do the rest of the labs ordered. Selma states she just needs a verbal to remove the labs ordered with the 24 hour urine so it can be processed. I spoke with Dr. Dellis Anes and Marcelino Duster regarding this issue. Dr. Dellis Anes does want the IgE but can be done separate. I called and left a voicemail for Selma to remove the Tryptase, IgE and Alpha Gal.   Can someone put in a separate IgE since this is a lab Dr. Dellis Anes does want. I called the patient to let her know of this information.

## 2023-08-30 NOTE — Addendum Note (Signed)
Addended by: Areta Haber B on: 08/30/2023 03:36 PM   Modules accepted: Orders

## 2023-08-30 NOTE — Telephone Encounter (Signed)
Per below notation - printed new requisition for just IgE and mailed to patient.

## 2023-08-31 LAB — ALLERGEN, TOMATO F25: Allergen Tomato, IgE: 0.42 kU/L — AB

## 2023-08-31 LAB — CHRONIC URTICARIA: cu index: 3.3 (ref <10)

## 2023-08-31 LAB — C-REACTIVE PROTEIN: CRP: 5 mg/L (ref 0–10)

## 2023-08-31 LAB — TRYPTASE: Tryptase: 6.3 ug/L (ref 2.2–13.2)

## 2023-08-31 LAB — SEDIMENTATION RATE: Sed Rate: 7 mm/h (ref 0–40)

## 2023-09-03 LAB — IGE: IgE (Immunoglobulin E), Serum: 48 [IU]/mL (ref 6–495)

## 2023-09-20 LAB — N-METHYLHISTAMINE, 24 HR, U
Creatinine Concent. 24 Hr, U: 178 mg/dL
Creatinine, 24 Hour, U: 890 mg/(24.h) (ref 603–1783)
N-Methylhistamine, 24 Hr, U: 124 ug/g{creat} (ref 30–200)

## 2023-09-28 ENCOUNTER — Ambulatory Visit (INDEPENDENT_AMBULATORY_CARE_PROVIDER_SITE_OTHER): Payer: No Typology Code available for payment source | Admitting: Allergy & Immunology

## 2023-09-28 ENCOUNTER — Encounter: Payer: Self-pay | Admitting: Allergy & Immunology

## 2023-09-28 ENCOUNTER — Other Ambulatory Visit: Payer: Self-pay

## 2023-09-28 VITALS — BP 136/78 | HR 79 | Temp 97.7°F | Resp 18 | Wt 342.4 lb

## 2023-09-28 DIAGNOSIS — L2089 Other atopic dermatitis: Secondary | ICD-10-CM

## 2023-09-28 DIAGNOSIS — R1084 Generalized abdominal pain: Secondary | ICD-10-CM

## 2023-09-28 DIAGNOSIS — L301 Dyshidrosis [pompholyx]: Secondary | ICD-10-CM

## 2023-09-28 DIAGNOSIS — J3089 Other allergic rhinitis: Secondary | ICD-10-CM

## 2023-09-28 DIAGNOSIS — T782XXD Anaphylactic shock, unspecified, subsequent encounter: Secondary | ICD-10-CM | POA: Diagnosis not present

## 2023-09-28 DIAGNOSIS — J4540 Moderate persistent asthma, uncomplicated: Secondary | ICD-10-CM

## 2023-09-28 DIAGNOSIS — K760 Fatty (change of) liver, not elsewhere classified: Secondary | ICD-10-CM | POA: Insufficient documentation

## 2023-09-28 DIAGNOSIS — J302 Other seasonal allergic rhinitis: Secondary | ICD-10-CM

## 2023-09-28 MED ORDER — AZELASTINE HCL 0.1 % NA SOLN: 1.0000 | Freq: Every day | NASAL | 5 refills | Status: DC

## 2023-09-28 MED ORDER — MONTELUKAST SODIUM 10 MG PO TABS: 10.0000 mg | ORAL_TABLET | Freq: Every day | ORAL | 1 refills | Status: DC

## 2023-09-28 MED ORDER — BREZTRI AEROSPHERE 160-9-4.8 MCG/ACT IN AERO: 2.0000 | INHALATION_SPRAY | Freq: Two times a day (BID) | RESPIRATORY_TRACT | 5 refills | Status: AC

## 2023-09-28 MED ORDER — OPZELURA 1.5 % EX CREA: TOPICAL_CREAM | CUTANEOUS | 5 refills | Status: DC

## 2023-09-28 MED ORDER — EPINEPHRINE 0.3 MG/0.3ML IJ SOAJ: 0.3000 mg | INTRAMUSCULAR | 2 refills | Status: DC | PRN

## 2023-09-28 MED ORDER — FLUTICASONE PROPIONATE 50 MCG/ACT NA SUSP: 2.0000 | Freq: Every day | NASAL | 5 refills | Status: DC

## 2023-09-28 MED ORDER — AIRSUPRA 90-80 MCG/ACT IN AERO: 2.0000 | INHALATION_SPRAY | RESPIRATORY_TRACT | 5 refills | Status: DC | PRN

## 2023-09-28 NOTE — Patient Instructions (Addendum)
1. Seasonal and perennial allergic rhinitis - Continue with the Flonase one spray per nostril daily. - Continue with the Astelin one spray per nostril daily.  - Continue with Singulair (montelukast) to help with your allergies and your breathing.  2. Dyshidrotic eczema - Continue with Opzelura twice daily on your hands. - This is not a steroid and can be used safely over the entire body.  4. Moderate persistent asthma, uncomplicated - Lung testing looked better today.  - Daily controller medication(s): Breztri two puffs twice daily with spacer - Prior to physical activity: AirSupra two puff 10-15 minutes before physical activity. - Rescue medications: AirSupra two puffs every 4-6 hours as needed.  - Asthma control goals:  * Full participation in all desired activities (may need albuterol before activity) * Albuterol use two time or less a week on average (not counting use with activity) * Cough interfering with sleep two time or less a month * Oral steroids no more than once a year * No hospitalizations  5. Concern for mast cell activation syndrome - We are sending out some urine studies.  - Thankfully the urine N-methylhistamine and tryptase has been normal.  - We will check on avocado and all of the other foods that you listed today. - EpiPen is up to date.  - Go to the [place] office to get these labs done. - Boykin Reaper will now exactly what to do.   6. Return in about 3 months (around 12/27/2023). You can have the follow up appointment with Dr. Dellis Anes or a Nurse Practicioner (our Nurse Practitioners are excellent and always have Physician oversight!).    Please inform us of any Emergency Department visits, hospitalizations, or changes in symptoms. Call us before going to the ED for breathing or allergy symptoms since we might be able to fit you in for a sick visit. Feel free to contact us anytime with any questions, problems, or concerns.  It was a pleasure to see you again  today!  Websites that have reliable patient information: 1. American Academy of Asthma, Allergy, and Immunology: www.aaaai.org 2. Food Allergy Research and Education (FARE): foodallergy.org 3. Mothers of Asthmatics: http://www.asthmacommunitynetwork.org 4. American College of Allergy, Asthma, and Immunology: www.acaai.org      "Like" [place] on Facebook and Instagram for our latest updates!      A healthy democracy works best when [employer] participate! Make sure you are registered to vote! If you have moved or changed any of your contact information, you will need to get this updated before voting! Scan the QR codes below to learn more!

## 2023-09-28 NOTE — Progress Notes (Unsigned)
FOLLOW UP  Date of Service/Encounter:  09/28/23   Assessment:   Seasonal and perennial allergic rhinitis (grasses, weeds, trees, molds, dust mite, dog, cat, horse, feathers, mouse)  Rheumatoid arthritis - now on Simponi with multiple prednisone doses   Multiple prednisone rounds due to uncontrolled RA   Recurrent bronchitis   Plan/Recommendations:   1. Seasonal and perennial allergic rhinitis - Continue with the Flonase one spray per nostril daily. - Continue with the Astelin one spray per nostril daily.  - Continue with Singulair (montelukast) to help with your allergies and your breathing.  2. Dyshidrotic eczema - Continue with Opzelura twice daily on your hands. - This is not a steroid and can be used safely over the entire body.  4. Moderate persistent asthma, uncomplicated - Lung testing looked better today.  - Daily controller medication(s): Breztri two puffs twice daily with spacer - Prior to physical activity: AirSupra two puff 10-15 minutes before physical activity. - Rescue medications: AirSupra two puffs every 4-6 hours as needed.  - Asthma control goals:  * Full participation in all desired activities (may need albuterol before activity) * Albuterol use two time or less a week on average (not counting use with activity) * Cough interfering with sleep two time or less a month * Oral steroids no more than once a year * No hospitalizations  5. Concern for mast cell activation syndrome - We are sending out some urine studies.  - Thankfully the urine N-methylhistamine and tryptase has been normal.  - We will check on avocado and all of the other foods that you listed today. - EpiPen is up to date.  - Go to the [place] office to get these labs done. - Boykin Reaper will now exactly what to do.   6. Return in about 3 months (around 12/27/2023). You can have the follow up appointment with Dr. Dellis Anes or a Nurse Practicioner (our Nurse Practitioners are excellent  and always have Physician oversight!).   Subjective:   Sarah Dean is a 57 y.o. female presenting today for follow up of  Chief Complaint  Patient presents with   Allergic Rhinitis    Allergic Reaction    Alpha gal, oat, tomatoes - currently avoiding    Other    Would like to go over [facility]     Sarah Dean has a history of the following: Patient Active Problem List   Diagnosis Date Noted   Fatty liver 09/28/2023   History of colonic polyps 03/01/2023   Cervical radiculopathy 02/26/2023   Cervical spondylosis 02/26/2023   Neuropathic pain 02/26/2023   Neck pain 02/02/2023   Rheumatoid arthritis involving both knees, unspecified whether rheumatoid factor present (HCC) 11/29/2022   Encounter to establish care with new doctor 11/29/2022   Class 3 severe obesity due to excess calories without serious comorbidity with body mass index (BMI) of 50.0 to 59.9 in adult [facility]) 11/29/2022   Morbid obesity (HCC) 08/13/2017   Pre-operative cardiovascular examination 05/24/2017   Rectal discomfort 01/01/2015    History obtained from: chart review and patient.  Discussed the use of AI scribe software for clinical note transcription with the patient and/or guardian, who gave verbal consent to proceed.  Sarah Dean is a 57 y.o. female presenting for a follow up visit.  He was last seen in October 2024.  At that time, we continue with Flonase and Astelin as well as Zyrtec.  We also started montelukast.  For her eczema, we added on Opzelura twice daily  on her hands.  For her asthma, lung testing was in the 60% range and improved slightly with albuterol.  We started Breztri 2 puffs twice daily and continue with Airsupra 2 puffs as needed.  Since last visit, she has largely done well.   The patient, with a history of respiratory issues, reports an improvement in her condition. She has been using Breztri for her breathing problems and has not needed to use her rescue inhaler for about a  week. However, she experienced a severe reaction to strong smells, which led to difficulty breathing, increased heart rate, and sweating. This reaction was managed by moving to a location with fresh air.  She has generally done well with changing exposures based on her positive alpha gal panel. She has changed her exposures including her medications.   The patient also reports multiple food sensitivities, which have led to gastrointestinal symptoms such as cramps and vomiting. She has identified several foods that seem to trigger these reactions, including Oreos, avocado oil, and potentially cashew nuts, cinnamon, eggs, wheat, turnip, chocolate, potatoes, oranges, spinach, strawberries, yeast, gluten, onions, and various beans. She has been trying to manage these sensitivities by making her own food and avoiding certain ingredients.  In addition to these issues, the patient has a history of rheumatoid arthritis (RA). She has noticed a correlation between her food reactions and RA symptoms. She is currently receiving infusions for her RA and is also taking Simponi. She has noticed an improvement in her neck issues since cutting out meat from her diet.  The patient also reports liver issues, which seem to improve with the use of milk thistle. She has found a vegan version of this supplement, which she has been using. She has also been using Flonase and montelukast for her respiratory issues, and she reports that her symptoms feel better controlled now than before.   Otherwise, there have been no changes to her past medical history, surgical history, family history, or social history.    Review of systems otherwise negative other than that mentioned in the HPI.    Objective:   Blood pressure 136/78, pulse 79, temperature 97.7 F (36.5 C), resp. rate 18, weight (!) 342 lb 6.4 oz (155.3 kg), last menstrual period 04/25/2007, SpO2 95%. Body mass index is 56.98 kg/m.    Physical Exam Vitals  reviewed.  Constitutional:      Appearance: She is well-developed. She is obese.     Comments: Cooperative with the exam.   HENT:     Head: Normocephalic and atraumatic.     Right Ear: Tympanic membrane, ear canal and external ear normal.     Left Ear: Tympanic membrane, ear canal and external ear normal.     Nose: No nasal deformity, septal deviation, mucosal edema or rhinorrhea.     Right Turbinates: Enlarged, swollen and pale.     Left Turbinates: Enlarged, swollen and pale.     Right Sinus: No maxillary sinus tenderness or frontal sinus tenderness.     Left Sinus: No maxillary sinus tenderness or frontal sinus tenderness.     Mouth/Throat:     Lips: Pink.     Mouth: Mucous membranes are moist. Mucous membranes are not pale.     Pharynx: Uvula midline.  Eyes:     General: Lids are normal. No allergic shiner.       Right eye: No discharge.        Left eye: No discharge.     Conjunctiva/sclera: Conjunctivae normal.  Right eye: Right conjunctiva is not injected. No chemosis.    Left eye: Left conjunctiva is not injected. No chemosis.    Pupils: Pupils are equal, round, and reactive to light.  Cardiovascular:     Rate and Rhythm: Normal rate and regular rhythm.     Heart sounds: Normal heart sounds.  Pulmonary:     Effort: Pulmonary effort is normal. No tachypnea, accessory muscle usage or respiratory distress.     Breath sounds: Normal breath sounds. No transmitted upper airway sounds. No wheezing, rhonchi or rales.     Comments: No coughing today. Doing much better overall.  Chest:     Chest wall: No tenderness.  Lymphadenopathy:     Cervical: No cervical adenopathy.  Skin:    Coloration: Skin is not pale.     Findings: No abrasion, erythema, petechiae or rash. Rash is not papular, urticarial or vesicular.     Comments: Hands looks much better than previous exams.   Neurological:     Mental Status: She is alert.  Psychiatric:        Behavior: Behavior is cooperative.       Diagnostic studies:    Spirometry: results normal (FEV1: 1.98/73%, FVC: 2.35/69%, FEV1/FVC: 84%).    Spirometry consistent with normal pattern.   Allergy Studies: none  Multiple labs sent to look for mast  cell activation syndrome, including urine studies.         Malachi Bonds, MD  [facility]

## 2023-10-01 ENCOUNTER — Telehealth: Payer: Self-pay

## 2023-10-01 LAB — IGE NUT PROF. W/COMPONENT RFLX

## 2023-10-01 LAB — F341-IGE CRANBERRY: Cranberry IgE: 0.1 kU/L — AB

## 2023-10-01 NOTE — Telephone Encounter (Signed)
Patient will have to redo bloodwork - she was suppose to complete two more tubs for blood work and drop off urine today 10/01/23. She forgot to start urine test and does not plan to come to complete bloodwork today. Labs needed to be completed and sent office today due to one being frozen since 09/28/23. Patient is not happy that she will have to redo blood work, but I explained that she was supposed to drop off urine sample today as well as complete bloodwork, but due to her not completing the urine sample, and refusing to come in for blood work she will have no choice but to redo the whole thing.   Patient plans to get bloodwork and sample sent office at a [facility] closer to her location.

## 2023-10-02 ENCOUNTER — Encounter: Payer: Self-pay | Admitting: Allergy & Immunology

## 2023-10-02 NOTE — Telephone Encounter (Signed)
That is frustrating. Thanks for the update.   Malachi Bonds, MD [facility]

## 2023-10-04 LAB — PEANUT COMPONENTS
F352-IgE Ara h 8: 0.35 kU/L — AB
F422-IgE Ara h 1: <0.1 kU/L
F423-IgE Ara h 2: <0.1 kU/L
F424-IgE Ara h 3: <0.1 kU/L
F427-IgE Ara h 9: <0.1 kU/L
F447-IgE Ara h 6: <0.1 kU/L

## 2023-10-04 LAB — PANEL 604726
Cor A 1 IgE: 1.22 kU/L — AB
Cor A 14 IgE: <0.1 kU/L
Cor A 8 IgE: <0.1 kU/L
Cor A 9 IgE: <0.1 kU/L

## 2023-10-04 LAB — EGG COMPONENT PANEL
F232-IgE Ovalbumin: <0.1 kU/L
F233-IgE Ovomucoid: 0.41 kU/L — AB

## 2023-10-04 LAB — IGE NUT PROF. W/COMPONENT RFLX
F017-IgE Hazelnut (Filbert): 0.83 kU/L — AB
F202-IgE Cashew Nut: 0.19 kU/L — AB
F202-IgE Cashew Nut: 0.35 kU/L — AB
F256-IgE Walnut: 0.23 kU/L — AB
Jug R 3 IgE: 0.42 kU/L — AB
Macadamia Nut, IgE: 0.34 kU/L — AB
Peanut, IgE: 0.44 kU/L — AB
Pecan Nut IgE: 0.11 kU/L — AB

## 2023-10-04 LAB — PANEL 604239: ANA O 3 IgE: <0.1 kU/L

## 2023-10-04 LAB — ALLERGEN AVOCADO F96: F096-IgE Avocado: 0.45 kU/L — AB

## 2023-10-04 LAB — ALLERGEN EGG YOLK F75: IgE Egg (Yolk): <0.1 kU/L

## 2023-10-04 LAB — ALLERGEN, KIDNEY BEAN, RF287: Kidney Bean IgE: 0.39 kU/L — AB

## 2023-10-04 LAB — ALLERGEN COMPONENT COMMENTS

## 2023-10-04 LAB — ALLERGEN, WHEAT, F4: Wheat IgE: 0.38 kU/L — AB

## 2023-10-04 LAB — ALLERGEN, BEAN BLACK
Black Bean*, IgE: 1.29 kU/L — ABNORMAL HIGH (ref <0.35)
Class Interpretation: 2

## 2023-10-04 LAB — ALLERGEN, BAKERS YEAST, F45: F045-IgE Yeast: <0.1 kU/L

## 2023-10-04 LAB — ALLERGEN, PINEAPPLE, F210: Pineapple IgE: 0.28 kU/L — AB

## 2023-10-04 LAB — ALLERGEN, CHICK PEA, RF309, IGE: F309-IgE Chick Pea: 0.33 kU/L — AB

## 2023-10-04 LAB — ALLERGEN CHOCOLATE: Chocolate/Cacao IgE: <0.1 kU/L

## 2023-10-04 LAB — ALLERGEN, STRAWBERRY, F44: Allergen Strawberry IgE: 0.24 kU/L — AB

## 2023-10-04 LAB — ALLERGEN, ORANGE F33: Orange: 0.28 kU/L — AB

## 2023-10-04 LAB — ALLERGEN, CINNAMON, RF220: Allergen Cinnamon IgE: <0.1 kU/L

## 2023-10-04 LAB — F214-IGE SPINACH: F214-IgE Spinach: 0.29 kU/L — AB

## 2023-10-04 LAB — ALLERGEN, GREEN BEAN, RF315: Allergen Green Bean IgE: 0.33 kU/L — AB

## 2023-10-04 LAB — ALLERGEN BARLEY F6: Allergen Barley IgE: 0.41 kU/L — AB

## 2023-10-04 LAB — ALLERGEN, SWEET POTATO,F54: Allergen Sweet Potato IgE: 0.4 kU/L — AB

## 2023-10-04 LAB — ALLERGEN, RYE, F5: Rye IgE: 0.42 kU/L — AB

## 2023-10-04 LAB — PANEL 604721
Jug R 1 IgE: <0.1 kU/L
Jug R 3 IgE: <0.1 kU/L

## 2023-10-04 LAB — ALLERGEN, WHITE POTATO,F35: Allergen Potato, White IgE: 0.4 kU/L — AB

## 2023-10-10 LAB — LEUKOTRIENE E4, 24 HR, U
Collection Duration: 24 h
Creatinine Concentration,24 HR: 148 mg/dL
Creatinine, 24 HR, U: 1480 mg/(24.h) (ref 603–1783)
Leukotriene E4, U: 164 pg/mg{creat} — ABNORMAL HIGH (ref <=104)
Urine Volume: 1000 mL

## 2023-10-17 DIAGNOSIS — L409 Psoriasis, unspecified: Secondary | ICD-10-CM

## 2023-10-17 HISTORY — DX: Psoriasis, unspecified: L40.9

## 2023-10-29 ENCOUNTER — Encounter: Payer: Self-pay | Admitting: Allergy & Immunology

## 2023-10-29 DIAGNOSIS — T782XXD Anaphylactic shock, unspecified, subsequent encounter: Secondary | ICD-10-CM

## 2023-11-01 ENCOUNTER — Other Ambulatory Visit: Payer: Self-pay | Admitting: Allergy & Immunology

## 2023-11-22 NOTE — Patient Instructions (Addendum)
 1. Seasonal and perennial allergic rhinitis - Continue with the Flonase  one spray per nostril daily. - Continue with the Astelin  one spray per nostril daily.  - Continue with Singulair  (montelukast ) to help with your allergies and your breathing.  2. Dyshidrotic eczema - Continue with Opzelura  twice daily on your hands. - This is not a steroid and can be used safely over the entire body.  4. Moderate persistent asthma, uncomplicated - Lung testing looked better today.  - Daily controller medication(s): Breztri  two puffs twice daily with spacer - Prior to physical activity: AirSupra  two puff 10-15 minutes before physical activity. - Rescue medications: AirSupra  two puffs every 4-6 hours as needed.  - Asthma control goals:  * Full participation in all desired activities (may need albuterol before activity) * Albuterol use two time or less a week on average (not counting use with activity) * Cough interfering with sleep two time or less a month * Oral steroids no more than once a year * No hospitalizations  5. Concern for mast cell activation syndrome - still waiting on Prostaglandin D2. Please get this done at your convenience - urine N-methylhistamine, CKIT, and tryptase are normal. Leukotriene E4 24 hour urine elevated. Consider repeating Leukotrient E4 24 hour urine. -Continue to avoid red meat due to elevated alpha gal,  black bean, hazelnut and all other foods that bother you - EpiPen  is up to date.  - Continue Singulair  as above -Continue cetirizine  -Recommend scheduling an appointment with GI due to abdominal pain and history of gastric surgery  We will put in a referral to dermatology due to possible psoriasis. We will also put in a referral to cardiology due to possible POTS Recommend scheduling an appointment with your primary care physician to discuss possible Lyme disease  6. Schedule a follow up in 3 months or sooner if needed

## 2023-11-23 ENCOUNTER — Ambulatory Visit: Payer: No Typology Code available for payment source | Admitting: Family Medicine

## 2023-11-23 ENCOUNTER — Encounter: Payer: Self-pay | Admitting: Family

## 2023-11-23 ENCOUNTER — Ambulatory Visit (INDEPENDENT_AMBULATORY_CARE_PROVIDER_SITE_OTHER): Payer: No Typology Code available for payment source | Admitting: Family

## 2023-11-23 ENCOUNTER — Other Ambulatory Visit: Payer: Self-pay

## 2023-11-23 VITALS — BP 122/78 | HR 88 | Temp 98.3°F | Ht 65.0 in | Wt 338.4 lb

## 2023-11-23 DIAGNOSIS — J3089 Other allergic rhinitis: Secondary | ICD-10-CM

## 2023-11-23 DIAGNOSIS — R21 Rash and other nonspecific skin eruption: Secondary | ICD-10-CM

## 2023-11-23 DIAGNOSIS — L301 Dyshidrosis [pompholyx]: Secondary | ICD-10-CM

## 2023-11-23 DIAGNOSIS — J4540 Moderate persistent asthma, uncomplicated: Secondary | ICD-10-CM | POA: Diagnosis not present

## 2023-11-23 DIAGNOSIS — R55 Syncope and collapse: Secondary | ICD-10-CM

## 2023-11-23 DIAGNOSIS — R1084 Generalized abdominal pain: Secondary | ICD-10-CM | POA: Diagnosis not present

## 2023-11-23 DIAGNOSIS — J302 Other seasonal allergic rhinitis: Secondary | ICD-10-CM

## 2023-11-23 LAB — KIT (D816V) DIGITAL PCR: CKIT Result: NEGATIVE

## 2023-11-23 NOTE — Progress Notes (Signed)
 [address] [place] [address] Dept: [phone]  FOLLOW UP NOTE  Patient ID: Sarah Dean, female    DOB: [birth year]  Age: 58 y.o. MRN: [id number] Date of Office Visit: 11/23/2023  Assessment  Chief Complaint: Follow-up (States she is having major GI issues)  HPI Sarah Dean is a 58 year old female who presents today for follow-up of seasonal and perennial allergic rhinitis, dyshidrotic eczema ,moderate persistent asthma, and concern for mast cell activation syndrome.  She was last seen July 25, 2023 by Dr. Iva.  She has a history of rheumatoid arthritis and has been on multiple rounds of prednisone  due to rheumatoid arthritis.  She denies any new diagnosis or surgery since her last office visit.  She reports that she continues to have stomach cramping and for 2 days possible colon spasms.  5 years ago she reports that she had gastric bypass.  She has not spoken with GI about her abdominal symptoms.  Her reflux has also been bad.  She started on over-the-counter famotidine and it helped, but then she saw her acupuncturist who recommended she try Chinese herbs to take in place of the famotidine.  She reports since then she has had a good stool.  She also mentions that her acupuncturist recommended she be tested for long COVID, Lyme's disease, celiac, or POTS.  She reports that she is tired all the time and when she stands up she gets dizzy and hot.  She report reports that she continues to avoid the foods that bother her such as beans, nuts, and dairy. She also avoids red meat due to alpha gal.  She does eat chicken and eggs, and egg noodles.  Seasonal and perennial allergic rhinitis: She reports postnasal drip in the morning when she wakes up otherwise she is not having a lot of symptoms.  She denies rhinorrhea and nasal congestion.  She has not been treated for any sinus infections since we last saw her.  She is using Flonase  daily, azelastine  nasal  spray daily and is not taking Zyrtec .  She found out on one of the [employer] groups that she should try quercetin and famotidine.  She reports that this has tremendously helped her anxiety and not wanting to end it..  She reports that she does see a therapist.  Dyshidrotic eczema: She reports that she thinks she has psoriasis.  She has seen dermatology several years ago, but would like a second opinion.  Moderate persistent asthma: She reports that smells will set her off and cause her asthma to flare.  This has happened 2 times since her last office visit otherwise she denies cough, wheeze, tightness in chest, shortness of breath, and nocturnal awakenings due to breathing problems.  She then mentions maybe she has a little bit of cough, but it is not awful.  Since her last office visit she is not made any trips to the emergency room or urgent care due to breathing problems.  She has been on steroids for her shoulder.  She continues to take Breztri  2 puffs twice a day with spacer and has Airsupra  to use as needed.  She is use this maybe 2 times past couple months.   Drug Allergies:  Allergies  Allergen Reactions   Alpha-Gal Anaphylaxis    Review of Systems: Negative except as per HPI   Physical Exam: BP 122/78   Pulse 88   Temp 98.3 F (36.8 C)   Ht 5' 5 (1.651 m)   [place] ROLLEN)  338 lb 6.4 oz (153.5 kg)   LMP 04/25/2007   SpO2 98%   BMI 56.31 kg/m    Physical Exam Constitutional:      Appearance: Normal appearance.  HENT:     Head: Normocephalic and atraumatic.     Comments: Pharynx normal. Eyes normal. Ears normal. Nose normal.    Right Ear: Tympanic membrane, ear canal and external ear normal.     Left Ear: Tympanic membrane, ear canal and external ear normal.     Nose: Nose normal.     Mouth/Throat:     Mouth: Mucous membranes are moist.     Pharynx: Oropharynx is clear.  Eyes:     Conjunctiva/sclera: Conjunctivae normal.  Cardiovascular:     Rate and Rhythm: Regular rhythm.      Heart sounds: Normal heart sounds.  Pulmonary:     Effort: Pulmonary effort is normal.     Breath sounds: Normal breath sounds.     Comments: Lungs clear to auscultation Musculoskeletal:     Cervical back: Neck supple.  Skin:    General: Skin is warm.     Comments: Flaky dry slightly erythematous areas on bilateral legs  Neurological:     Mental Status: She is alert and oriented to person, place, and time.  Psychiatric:        Mood and Affect: Mood normal.        Behavior: Behavior normal.        Thought Content: Thought content normal.        Judgment: Judgment normal.    Diagnostics: FVC 2.63 L (77%), FEV1 2.25 L (83%), FEV1/FVC 0.86.  Spirometry indicates normal spirometry    Assessment and Plan: 1. Near syncope   2. Rash   3. Generalized abdominal pain   4. Seasonal and perennial allergic rhinitis   5. Dyshidrotic eczema   6. Moderate persistent asthma, uncomplicated     No orders of the defined types were placed in this encounter.   Patient Instructions  1. Seasonal and perennial allergic rhinitis - Continue with the Flonase  one spray per nostril daily. - Continue with the Astelin  one spray per nostril daily.  - Continue with Singulair  (montelukast ) to help with your allergies and your breathing.  2. Dyshidrotic eczema - Continue with Opzelura  twice daily on your hands. - This is not a steroid and can be used safely over the entire body.  4. Moderate persistent asthma, uncomplicated - Lung testing looked better today.  - Daily controller medication(s): Breztri  two puffs twice daily with spacer - Prior to physical activity: AirSupra  two puff 10-15 minutes before physical activity. - Rescue medications: AirSupra  two puffs every 4-6 hours as needed.  - Asthma control goals:  * Full participation in all desired activities (may need albuterol before activity) * Albuterol use two time or less a week on average (not counting use with activity) * Cough interfering  with sleep two time or less a month * Oral steroids no more than once a year * No hospitalizations  5. Concern for mast cell activation syndrome - still waiting on Prostaglandin D2. Please get this done at your convenience - urine N-methylhistamine, CKIT, and tryptase are normal. Leukotriene E4 24 hour urine elevated. Consider repeating Leukotrient E4 24 hour urine. -Continue to avoid red meat due to elevated alpha gal,  black bean, hazelnut and all other foods that bother you - EpiPen  is up to date.  - Continue Singulair  as above -Continue cetirizine  -Recommend scheduling an appointment with  GI due to abdominal pain and history of gastric surgery  We will put in a referral to dermatology due to possible psoriasis. We will also put in a referral to cardiology due to possible POTS Recommend scheduling an appointment with your primary care physician to discuss possible Lyme disease  6. Schedule a follow up in 3 months or sooner if needed      Return in about 3 months (around 02/20/2024), or if symptoms worsen or fail to improve.    Thank you for the opportunity to care for this patient.  Please do not hesitate to contact me with questions.  Wanda Craze, FNP [facility] 

## 2023-11-26 NOTE — Addendum Note (Signed)
 Addended by: Corinda Dibbles on: 11/26/2023 08:44 AM   Modules accepted: Orders

## 2023-12-04 LAB — PROSTAGLANDIN D2, SERUM: Prostaglandin D2, serum: 3.8 pg/mL

## 2023-12-04 LAB — F341-IGE CRANBERRY: Cranberry IgE: <0.1 kU/L

## 2023-12-25 ENCOUNTER — Other Ambulatory Visit: Payer: Self-pay | Admitting: Allergy & Immunology

## 2024-01-09 ENCOUNTER — Ambulatory Visit: Payer: No Typology Code available for payment source | Admitting: Allergy & Immunology

## 2024-02-11 ENCOUNTER — Other Ambulatory Visit: Payer: Self-pay | Admitting: Allergy & Immunology

## 2024-02-14 ENCOUNTER — Other Ambulatory Visit: Payer: Self-pay | Admitting: Allergy & Immunology

## 2024-02-25 ENCOUNTER — Other Ambulatory Visit: Payer: Self-pay | Admitting: Allergy & Immunology

## 2024-03-12 ENCOUNTER — Ambulatory Visit: Payer: Self-pay | Admitting: Internal Medicine

## 2024-03-21 ENCOUNTER — Encounter (HOSPITAL_COMMUNITY): Payer: Self-pay | Admitting: *Deleted

## 2024-03-24 ENCOUNTER — Other Ambulatory Visit: Payer: Self-pay | Admitting: Allergy & Immunology

## 2024-03-28 ENCOUNTER — Ambulatory Visit (INDEPENDENT_AMBULATORY_CARE_PROVIDER_SITE_OTHER): Payer: No Typology Code available for payment source | Admitting: Allergy & Immunology

## 2024-03-28 ENCOUNTER — Encounter: Payer: Self-pay | Admitting: Allergy & Immunology

## 2024-03-28 VITALS — BP 112/68 | HR 104 | Temp 98.2°F | Resp 20 | Wt 330.5 lb

## 2024-03-28 DIAGNOSIS — L2089 Other atopic dermatitis: Secondary | ICD-10-CM

## 2024-03-28 DIAGNOSIS — L301 Dyshidrosis [pompholyx]: Secondary | ICD-10-CM | POA: Diagnosis not present

## 2024-03-28 DIAGNOSIS — D8940 Mast cell activation, unspecified: Secondary | ICD-10-CM

## 2024-03-28 DIAGNOSIS — R55 Syncope and collapse: Secondary | ICD-10-CM | POA: Diagnosis not present

## 2024-03-28 DIAGNOSIS — J302 Other seasonal allergic rhinitis: Secondary | ICD-10-CM

## 2024-03-28 DIAGNOSIS — T782XXD Anaphylactic shock, unspecified, subsequent encounter: Secondary | ICD-10-CM

## 2024-03-28 DIAGNOSIS — J3089 Other allergic rhinitis: Secondary | ICD-10-CM | POA: Diagnosis not present

## 2024-03-28 DIAGNOSIS — J4540 Moderate persistent asthma, uncomplicated: Secondary | ICD-10-CM

## 2024-03-28 MED ORDER — BREZTRI AEROSPHERE 160-9-4.8 MCG/ACT IN AERO: 2.0000 | INHALATION_SPRAY | Freq: Two times a day (BID) | RESPIRATORY_TRACT | 1 refills | Status: DC

## 2024-03-28 MED ORDER — CROMOLYN SODIUM 100 MG/5ML PO CONC: 100.0000 mg | Freq: Three times a day (TID) | ORAL | 3 refills | Status: AC

## 2024-03-28 NOTE — Patient Instructions (Addendum)
 1. Seasonal and perennial allergic rhinitis - Continue with the Flonase  one spray per nostril daily. - Continue with the Astelin  one spray per nostril daily.  - Continue with Singulair  (montelukast ) to help with your allergies and your breathing.  2. Dyshidrotic eczema - Continue with Opzelura  twice daily on your hands. - This is not a steroid and can be used safely over the entire body. - Appointment scheduled for October 13th with Dr. Myrtie Atkinson.   4. Moderate persistent asthma, uncomplicated - Lung testing not done today. - I sent the Breztri  to [facility], so let us  know how much it is.  - Daily controller medication(s): Breztri  two puffs twice daily with spacer - Prior to physical activity: AirSupra  two puff 10-15 minutes before physical activity. - Rescue medications: AirSupra  two puffs every 4-6 hours as needed.  - Asthma control goals:  * Full participation in all desired activities (may need albuterol before activity) * Albuterol use two time or less a week on average (not counting use with activity) * Cough interfering with sleep two time or less a month * Oral steroids no more than once a year * No hospitalizations  5. Concern for mast cell activation syndrome - with elevated leukotriene E4  - Continue with Quercetin daily.  - Continue with Luteolin daily. - Continue with famotidine 20mg  daily.  - Continue Singulair  (montelukast ) - Continue cetirizine  10mg  daily.  - Start cromolyn 5 mL for 30 minutes before your meals.  - Standing tryptase ordered to get collected when you are actively flaring.  - Continue to avoid all of your triggering foods (especially black bean and hazelnut).  -Continue to avoid red meat due to elevated alpha gal,  black bean, hazelnut and all other foods that bother you - EpiPen  is up to date.   4. POTS - We referred you again for evaluation of POTS to Cardiology.   5. Return in about 6 months (around 09/27/2024). You can have the follow up  appointment with Dr. Idolina Maker or a Nurse Practicioner (our Nurse Practitioners are excellent and always have Physician oversight!).    Please inform us  of any Emergency Department visits, hospitalizations, or changes in symptoms. Call us  before going to the ED for breathing or allergy  symptoms since we might be able to fit you in for a sick visit. Feel free to contact us  anytime with any questions, problems, or concerns.  It was a pleasure to see you again today!  Websites that have reliable patient information: 1. American Academy of Asthma, Allergy , and Immunology: www.aaaai.org 2. Food Allergy  Research and Education (FARE): foodallergy.org 3. Mothers of Asthmatics: http://www.asthmacommunitynetwork.org 4. [employer] of Allergy , Asthma, and Immunology: www.acaai.org      "Like" us  on Facebook and Instagram for our latest updates!      A healthy democracy works best when [employer] participate! Make sure you are registered to vote! If you have moved or changed any of your contact information, you will need to get this updated before voting! Scan the QR codes below to learn more!

## 2024-03-28 NOTE — Progress Notes (Unsigned)
 FOLLOW UP  Date of Service/Encounter:  03/28/24   Assessment:   Seasonal and perennial allergic rhinitis (grasses, weeds, trees, molds, dust mite, dog, cat, horse, feathers, mouse)  Rheumatoid arthritis - now on Simponi with multiple prednisone  doses   Multiple prednisone  rounds due to uncontrolled RA   Recurrent bronchitis - better since starting Breztri   Stomach pain/cramping with GERD  Concern for mast cell activation syndrome - with elevated urinary leukotriene E4  Plan/Recommendations:   1. Seasonal and perennial allergic rhinitis - Continue with the Flonase  one spray per nostril daily. - Continue with the Astelin  one spray per nostril daily.  - Continue with Singulair  (montelukast ) to help with your allergies and your breathing.  2. Dyshidrotic eczema - Continue with Opzelura  twice daily on your hands. - This is not a steroid and can be used safely over the entire body. - Appointment scheduled for October 13th with Dr. Myrtie Atkinson.   4. Moderate persistent asthma, uncomplicated - Lung testing not done today. - I sent the Breztri  to [facility], so let us  know how much it is.  - Daily controller medication(s): Breztri  two puffs twice daily with spacer - Prior to physical activity: AirSupra  two puff 10-15 minutes before physical activity. - Rescue medications: AirSupra  two puffs every 4-6 hours as needed.  - Asthma control goals:  * Full participation in all desired activities (may need albuterol before activity) * Albuterol use two time or less a week on average (not counting use with activity) * Cough interfering with sleep two time or less a month * Oral steroids no more than once a year * No hospitalizations  5. Concern for mast cell activation syndrome - with elevated leukotriene E4  - Continue with Quercetin daily.  - Continue with Luteolin daily. - Continue with famotidine 20mg  daily.  - Continue Singulair  (montelukast ) - Continue cetirizine  10mg  daily.  -  Start cromolyn 5 mL for 30 minutes before your meals.  - Standing tryptase ordered to get collected when you are actively flaring.  - Continue to avoid all of your triggering foods (especially black bean and hazelnut).  -Continue to avoid red meat due to elevated alpha gal,  black bean, hazelnut and all other foods that bother you - EpiPen  is up to date.   4. POTS - We referred you again for evaluation of POTS to Cardiology.  - There is overlap of POTS and mast cell activation syndrome.  5. Return in about 6 months (around 09/27/2024). You can have the follow up appointment with Dr. Idolina Maker or a Nurse Practicioner (our Nurse Practitioners are excellent and always have Physician oversight!).    Total of 40 minutes, greater than 50% of which was spent in discussion of treatment and management options.    Subjective:   Sarah Dean is a 58 y.o. female presenting today for follow up of  Chief Complaint  Patient presents with   Follow-up    Just got over Covid- coughing and wheezing     Sarah Dean has a history of the following: Patient Active Problem List   Diagnosis Date Noted   Fatty liver 09/28/2023   History of colonic polyps 03/01/2023   Cervical radiculopathy 02/26/2023   Cervical spondylosis 02/26/2023   Neuropathic pain 02/26/2023   Neck pain 02/02/2023   Rheumatoid arthritis involving both knees, unspecified whether rheumatoid factor present (HCC) 11/29/2022   Encounter to establish care with new doctor 11/29/2022   Class 3 severe obesity due to excess calories without serious  comorbidity with body mass index (BMI) of 50.0 to 59.9 in adult 11/29/2022   Morbid obesity (HCC) 08/13/2017   Pre-operative cardiovascular examination 05/24/2017   Rectal discomfort 01/01/2015    History obtained from: chart review and patient.  Discussed the use of AI scribe software for clinical note transcription with the patient and/or guardian, who gave verbal consent to  proceed.  Sarah Dean is a 58 y.o. female presenting for a follow up visit.  She was last seen in February 2025 by Tinnie Forehand, one of our nurse practitioners.  At that time, her lung testing looked better.  She was continued on Breztri  2 puffs twice daily and AirSupra  2 puffs as needed.  We continued her workup for mast cell activation syndrome.  We recommended continued avoidance of red meat.  EpiPen  is up-to-date.  She continue with Singulair  and cetirizine .  We recommended that she see gastroenterology due to abdominal pain and gastric surgery.  We also put in a referral to dermatology for evaluation of possible psoriasis.  We referred her to cardiology due to concern for POTS.  She was concerned with Lyme disease and we recommended that she talk to her PCP about this.  Her labs were notable for an elevated leukotriene E4 in her urine of 164 with a normal less than 104. C-KIT mutation negative.   Since last visit, she has been about the same.  She recently recovered from COVID-19, which she believes exacerbated her mast cell activation syndrome. During her illness, she experienced wheezing and other respiratory issues. She did not take Paxlovid during her COVID-19 infection.  Asthma/Respiratory Symptom History: She is currently using Breztri  for asthma management, which costs $190, and has stopped using Airsupra  due to its cost of $200. She has previously used Trelegy but experienced a 'weird taste' and sores in her mouth. She has not undergone a methacholine challenge and responds well to inhalers. She experiences difficulty breathing triggered by certain smells, such as curtains in a store bathroom.  Allergic Rhinitis Symptom History: She remains on the Flonase  and Astelin .  She also has Singulair .  She has not been on antibiotics.  She is overall doing well from her environmental allergy  standpoint.  That is really the least of her worries at this point in time.  he manages her mast cell  activation syndrome with several medications, including famotidine 20 mg once daily, cetirizine , and montelukast . She recently started taking cetirizine  after her COVID-19 infection. She experiences wheezing and believes her mast cell activation syndrome worsened after COVID-19. She has tried quercetin and luteolin, which seem to help her symptoms.  She has dietary restrictions due to her mast cell activation syndrome and alpha-gal allergy . She avoids various foods, including strawberries, spinach, pineapple, tomatoes, and gluten. She eats chicken, fish, potatoes, and occasionally cashew butter. She has reacted to strawberries and pineapple with stomach pain and mouth sores. She avoids peanut  butter due to past reactions.  She has experienced stomach pain, cramps, and diarrhea, which she associates with her mast cell activation syndrome. She has not tried cromolyn, a mast cell stabilizer.  We have done quite a bit of blood work for foods in the past.  She had negative testing to egg, chocolate, and yeast.  Her black bean IgE was very high.  Her hazelnut components were also notable for a high risk part of the hazelnut protein.  We recommended avoidance of hazelnuts and black beans.  A lot of her other foods were also positive, although not very high.  Avoids: peanuts/tree nuts, chickpea, black bean, green beans, barley, spinach, avocado  Definitely reacts: cranberry (GERD), strawberry (upset stomach, diarrhea), orange (upset stomach), pineapple (mouth sores), tomatoes (mouth sores), cinnamon (GERD), eggs (upset stomach), alpha gal   Eats: cashew, rye, white beans, yeast, white and sweet potato, wheat  Skin Symptom History: She has a history of eczema, which she manages by applying lotion multiple times a day and wearing latex gloves overnight. She washes her hands frequently due to fear of illness. She has tried dietary changes, including quercetin, to manage her symptoms. She has a history of  working in agriculture, including tobacco and orange picking, and reports developing red splotches on her legs after being outside.  Specifically, she picked up oranges.  She has a history of rheumatoid arthritis and was previously on Humira  and methotrexate , which caused burning sensations. She now receives infusions every six weeks but experiences itching post-infusion.  Otherwise, there have been no changes to her past medical history, surgical history, family history, or social history.    Review of systems otherwise negative other than that mentioned in the HPI.    Objective:   Blood pressure 112/68, pulse (!) 104, temperature 98.2 F (36.8 C), resp. rate 20, weight (!) 330 lb 8 oz (149.9 kg), last menstrual period 04/25/2007, SpO2 97%. Body mass index is 55 kg/m.    Physical Exam Vitals reviewed.  Constitutional:      Appearance: She is well-developed. She is obese.     Comments: Cooperative with the exam.  Talkative.  HENT:     Head: Normocephalic and atraumatic.     Right Ear: Tympanic membrane, ear canal and external ear normal.     Left Ear: Tympanic membrane, ear canal and external ear normal.     Nose: No nasal deformity, septal deviation, mucosal edema or rhinorrhea.     Right Turbinates: Enlarged, swollen and pale.     Left Turbinates: Enlarged, swollen and pale.     Right Sinus: No maxillary sinus tenderness or frontal sinus tenderness.     Left Sinus: No maxillary sinus tenderness or frontal sinus tenderness.     Comments: No polyps.    Mouth/Throat:     Lips: Pink.     Mouth: Mucous membranes are moist. Mucous membranes are not pale.     Pharynx: Uvula midline.   Eyes:     General: Lids are normal. No allergic shiner.       Right eye: No discharge.        Left eye: No discharge.     Conjunctiva/sclera: Conjunctivae normal.     Right eye: Right conjunctiva is not injected. No chemosis.    Left eye: Left conjunctiva is not injected. No chemosis.     Pupils: Pupils are equal, round, and reactive to light.    Cardiovascular:     Rate and Rhythm: Normal rate and regular rhythm.     Heart sounds: Normal heart sounds.  Pulmonary:     Effort: Pulmonary effort is normal. No tachypnea, accessory muscle usage or respiratory distress.     Breath sounds: Normal breath sounds. No transmitted upper airway sounds. No wheezing, rhonchi or rales.     Comments: No coughing today. Doing much better overall.  Chest:     Chest wall: No tenderness.  Lymphadenopathy:     Cervical: No cervical adenopathy.   Skin:    Coloration: Skin is not pale.     Findings: No abrasion, erythema, petechiae or rash. Rash is not  papular, urticarial or vesicular.     Comments: Hands looks much better than previous exams.    Neurological:     Mental Status: She is alert.   Psychiatric:        Behavior: Behavior is cooperative.      Diagnostic studies:  labs ordered instead        Drexel Gentles, MD  [facility] 

## 2024-04-01 ENCOUNTER — Encounter: Payer: Self-pay | Admitting: Allergy & Immunology

## 2024-04-07 ENCOUNTER — Encounter: Payer: Self-pay | Admitting: Allergy & Immunology

## 2024-04-07 DIAGNOSIS — T782XXD Anaphylactic shock, unspecified, subsequent encounter: Secondary | ICD-10-CM

## 2024-04-07 DIAGNOSIS — D8940 Mast cell activation, unspecified: Secondary | ICD-10-CM

## 2024-05-14 ENCOUNTER — Telehealth: Payer: Self-pay

## 2024-05-14 ENCOUNTER — Encounter: Payer: Self-pay | Admitting: Internal Medicine

## 2024-05-14 ENCOUNTER — Other Ambulatory Visit (INDEPENDENT_AMBULATORY_CARE_PROVIDER_SITE_OTHER)

## 2024-05-14 ENCOUNTER — Other Ambulatory Visit (HOSPITAL_COMMUNITY): Payer: Self-pay

## 2024-05-14 ENCOUNTER — Other Ambulatory Visit: Payer: Self-pay

## 2024-05-14 ENCOUNTER — Ambulatory Visit (INDEPENDENT_AMBULATORY_CARE_PROVIDER_SITE_OTHER): Payer: Self-pay | Admitting: Internal Medicine

## 2024-05-14 VITALS — BP 140/82 | Ht 64.0 in | Wt 325.0 lb

## 2024-05-14 DIAGNOSIS — R109 Unspecified abdominal pain: Secondary | ICD-10-CM

## 2024-05-14 DIAGNOSIS — Z91014 Allergy to mammalian meats: Secondary | ICD-10-CM

## 2024-05-14 DIAGNOSIS — K219 Gastro-esophageal reflux disease without esophagitis: Secondary | ICD-10-CM

## 2024-05-14 DIAGNOSIS — K5900 Constipation, unspecified: Secondary | ICD-10-CM | POA: Diagnosis not present

## 2024-05-14 DIAGNOSIS — K649 Unspecified hemorrhoids: Secondary | ICD-10-CM

## 2024-05-14 MED ORDER — LINACLOTIDE 72 MCG PO CAPS: 72.0000 ug | ORAL_CAPSULE | Freq: Every day | ORAL | 3 refills | Status: DC

## 2024-05-14 MED ORDER — HYDROCORTISONE (PERIANAL) 2.5 % EX CREA: 1.0000 | TOPICAL_CREAM | Freq: Two times a day (BID) | CUTANEOUS | 1 refills | Status: AC

## 2024-05-14 NOTE — Progress Notes (Signed)
 Chief Complaint: Constipation  HPI : 58 year old female with history of rheumatoid arthritis, obesity, GERD, and anemia presents with constipation  She has struggled with IBS but she is not sure her GI symptoms could alternatively be related to MCAS or gluten sensitivity. She has previously been diagnosed with alpha gal and tries to avoid all red meat. When she doesn't eat things that upset her stomach, her stomach goes to sleep (she feels constipated). She has no gas and she has to take a laxative to help her go to have a BM. She doesn't feel like she empties her rectum fully. She drinks plenty of water . She does try to eat fiber. When she does eat more fiber, this seems to help with her constipation. She did buy some fiber supplement and plans to try this soon. She has bad muscle cramps in the abdomen. The cramps happens at different times but sometimes before she has to have a BM. She did have an episode of LLQ ab pain that resolved on its own. If she gets too hot, she will get nauseous. Denies vomiting. Denies dysphagia or odynophagia unless she smells certain things. She has a lot of acid reflux issues for which she takes Pepcid. She has lost a little bit of weight. She has some blood in the stools when she is straining. Mother and aunt with frequent BMs.   Past Medical History:  Diagnosis Date   Abnormal Pap smear of cervix    yrs ago   Adenomatous colon polyp    Allergy     seasonal    Anemia    past hx as young woman    Bursitis of shoulder, right 2021   Cataract    forming    Condyloma    GERD (gastroesophageal reflux disease)    Hemorrhoids    Morbid obesity (HCC)    Pneumonia    hx of    Rheumatoid arthritis (HCC) 2005   STD (sexually transmitted disease)    + trich   Tubal ectopic pregnancy    times 2     Past Surgical History:  Procedure Laterality Date   ANKLE SURGERY     CHOLECYSTECTOMY  04/25/07   COLONOSCOPY N/A 03/24/2015   MFM:ojmhz external  hemorrhoids/moderate sized internal hemorrhoids/moderate diverticulosis in the sigmoid colon   COLONOSCOPY     COLONOSCOPY WITH PROPOFOL  N/A 03/01/2023   Procedure: COLONOSCOPY WITH PROPOFOL ;  Surgeon: Federico Rosario BROCKS, MD;  Location: [facility];  Service: Gastroenterology;  Laterality: N/A;   HEMORRHOID BANDING N/A 03/24/2015   Procedure: HEMORRHOID BANDING;  Surgeon: Margo LITTIE Haddock, MD;  [facility];  Service: Endoscopy;  Laterality: N/A;   LAPAROSCOPIC GASTRIC SLEEVE RESECTION N/A 08/13/2017   Procedure: LAPAROSCOPIC GASTRIC SLEEVE RESECTION, UPPER ENDOSCOPY HIATAL HERNIA REPAIR;  Surgeon: Kinsinger, Herlene Righter, MD;  Location: [facility];  Service: General;  Laterality: N/A;   POLYPECTOMY     POLYPECTOMY  03/01/2023   Procedure: POLYPECTOMY;  Surgeon: Federico Rosario BROCKS, MD;  Location: [facility];  Service: Gastroenterology;;   TOTAL ABDOMINAL HYSTERECTOMY  04/25/07   BSO   WISDOM TOOTH EXTRACTION     Family History  Problem Relation Age of Onset   Cancer Father    Cirrhosis Father    Esophageal cancer Father    Atrial fibrillation Mother    Colon cancer Neg Hx    Colon polyps Neg Hx    Rectal cancer Neg Hx    Stomach cancer Neg Hx  Social History   Tobacco Use   Smoking status: Former   Smokeless tobacco: Never   Tobacco comments:    in high school  Vaping Use   Vaping status: Never Used  Substance Use Topics   Alcohol use: No    Comment: rarely   Drug use: Yes    Types: Marijuana    Comment: occ   Current Outpatient Medications  Medication Sig Dispense Refill   albuterol (VENTOLIN HFA) 108 (90 Base) MCG/ACT inhaler Inhale 1-2 puffs into the lungs every 6 (six) hours as needed for wheezing or shortness of breath.     Albuterol-Budesonide (AIRSUPRA ) 90-80 MCG/ACT AERO Inhale 2 puffs into the lungs every 4 (four) hours as needed. 10.7 g 5   azelastine  (ASTELIN ) 0.1 % nasal spray Place 1 spray into both nostrils daily. Use in each nostril as directed 30 mL 5    budesonide-glycopyrrolate-formoterol (BREZTRI  AEROSPHERE) 160-9-4.8 MCG/ACT AERO inhaler Inhale 2 puffs into the lungs 2 (two) times daily. 32.1 g 1   Cetirizine  HCl (ZYRTEC  ALLERGY  CHILDRENS PO) Take 10 mg by mouth.     EPINEPHRINE  0.3 mg/0.3 mL IJ SOAJ injection INJECT 1 PEN INTRAMUSCULARLY AS  NEEDED FOR ALLERGIC RESPONSE AS  DIRECTED BY MD. [facility]  HELP AFTER USE 2 each 1   Famotidine (PEPCID PO) Take by mouth.     fluticasone  (FLONASE ) 50 MCG/ACT nasal spray USE 2 SPRAYS IN BOTH NOSTRILS  DAILY 48 g 2   LUTEIN PO Take by mouth.     Milk Thistle 1000 MG CAPS Take 1,000 mg by mouth daily.     montelukast  (SINGULAIR ) 10 MG tablet TAKE 1 TABLET BY MOUTH DAILY AT  BEDTIME 90 tablet 1   Olopatadine  HCl 0.2 % SOLN Apply 1 drop to eye daily as needed (itchy watery eyes). 2.5 mL 5   OPZELURA  1.5 % CREA APPLY TOPICALLY TWICE DAILY ON YOUR HANDS 60 g 4   OVER THE COUNTER MEDICATION Take 100 mg by mouth 2 (two) times daily. CBD capsules     VITAMIN D  PO Take by mouth.     No current facility-administered medications for this visit.   Allergies  Allergen Reactions   Alpha-Gal Anaphylaxis     Review of Systems: All systems reviewed and negative except where noted in HPI.   Physical Exam: BP (!) 140/82   Ht 5' 4 (1.626 m)   Wt (!) 325 lb (147.4 kg)   LMP 04/25/2007   BMI 55.79 kg/m  Constitutional: Pleasant,well-developed, female in no acute distress. HEENT: Normocephalic and atraumatic. Conjunctivae are normal. No scleral icterus. Cardiovascular: Normal rate, regular rhythm.  Pulmonary/chest: Effort normal and breath sounds normal. No wheezing, rales or rhonchi. Abdominal: Soft, nondistended, nontender. Bowel sounds active throughout. There are no masses palpable. No hepatomegaly. Extremities: No edema Neurological: Alert and oriented to person place and time. Skin: Skin is warm and dry. No rashes noted. Psychiatric: Normal mood and affect. Behavior is normal.  Labs 11/2022: CBC  nml. CMP nml.   Labs 07/2023: Alpha gal positive.  Labs 08/2023: Tryptase nml.   Colonoscopy 03/01/23: The terminal ileum appeared normal. Four sessile polyps were found in the transverse colon, ascending colon and cecum. The polyps were 3 to 5 mm in size. These polyps were removed with a cold snare. Resection and retrieval were complete. Multiple diverticula were found in the sigmoid colon, descending colon and transverse colon. Non- bleeding internal hemorrhoids were found during retroflexion. Path: A. CECUM, ASCENDING AND TRANSVERSE COLON, POLYPECTOMY:  Tubular adenoma, 2 fragments  Serrated polyp compatible with hyperplastic polyp, 2 fragments  Negative for high-grade dysplasia and carcinoma   ASSESSMENT AND PLAN: Constipation, occasional diarrhea Abdominal discomfort Alpha gal syndrome GERD Hemorrhoids Patient presents with some issues with constipation and occasional abdominal cramping. If she eats certain trigger foods, this will cause more frequent BMs. She was diagnosed with alpha gal syndrome and does try to avoid red meat. Will have her start a daily fiber supplement and take Linzess  daily to try to reduce more regular BMs. Patient did recently get treated with acupuncture for alpha gal and thus would like to be tested again to see if she still has alpha gal syndrome. Will also evaluate for celiac disease and serum tryptase to evaluate for MCAS since she is describing some active symptoms currently. She is also describing some rectal bleeding, which is most likely due to hemorrhoids. She last had a colonoscopy done in 02/2023 that showed some colon polyps and hemorrhoids.  - Avoid trigger foods - Check TTG IgA, IgA, alpha gal panel, tryptase - Start daily fiber supplement - Start Linzess  72 mcg every day - Continue Pepcid - Anusol  HC cream BID for 7 days - Consider amitriptyline 10 mg at bedtime in the future - RTC 3 months  Estefana Kidney, MD  I spent 43 minutes of time,  including in depth chart review, independent review of results as outlined above, communicating results with the patient directly, face-to-face time with the patient, coordinating care, ordering studies and medications as appropriate, and documentation.

## 2024-05-14 NOTE — Telephone Encounter (Signed)
*  Gastro  Pharmacy Patient Advocate Encounter   Received notification from CoverMyMeds that prior authorization for Linzess  capsules  is required/requested.   Insurance verification completed.   The patient is insured through [facility] .   Per test claim: PA required; PA submitted to above mentioned insurance via CoverMyMeds Key/confirmation #/EOC [id number] Status is pending

## 2024-05-14 NOTE — Patient Instructions (Signed)
 Start a daily fiber supplement.  Start daily Linzess  72 mcg.  We have sent the following medications to your pharmacy for you to pick up at your convenience: Anusol  cream twice daily for seven days.  Your provider has requested that you go to the basement level for lab work before leaving today. Press B on the elevator. The lab is located at the first door on the left as you exit the elevator.  _______________________________________________________  If your blood pressure at your visit was 140/90 or greater, please contact your primary care physician to follow up on this.  _______________________________________________________  If you are age 10 or older, your body mass index should be between 23-30. Your Body mass index is 55.79 kg/m. If this is out of the aforementioned range listed, please consider follow up with your Primary Care Provider.  If you are age 70 or younger, your body mass index should be between 19-25. Your Body mass index is 55.79 kg/m. If this is out of the aformentioned range listed, please consider follow up with your Primary Care Provider.   ________________________________________________________  The [facility] providers would like to encourage you to use MYCHART to communicate with providers for non-urgent requests or questions.  Due to long hold times on the telephone, sending your provider a message by [facility] may be a faster and more efficient way to get a response.  Please allow 48 business hours for a response.  Please remember that this is for non-urgent requests.  _______________________________________________________  [facility] is using a team-based approach to care.  Your team is made up of your doctor and two to three APPS. Our APPS (Nurse Practitioners and Physician Assistants) work with your physician to ensure care continuity for you. They are fully qualified to address your health concerns and develop a treatment plan. They  communicate directly with your gastroenterologist to care for you. Seeing the Advanced Practice Practitioners on your physician's team can help you by facilitating care more promptly, often allowing for earlier appointments, access to diagnostic testing, procedures, and other specialty referrals.    It was a pleasure to see you today!  Thank you for trusting me with your gastrointestinal care!

## 2024-05-14 NOTE — Telephone Encounter (Signed)
 Pharmacy Patient Advocate Encounter  Received notification from [employer] that Prior Authorization for Linzess  capsules  has been APPROVED from 05/14/2024 to 05/14/2025. Ran test claim, Copay is $40.00. This test claim was processed through [facility]- copay amounts may vary at other pharmacies due to pharmacy/plan contracts, or as the patient moves through the different stages of their insurance plan.

## 2024-05-16 ENCOUNTER — Encounter: Payer: Self-pay | Admitting: Internal Medicine

## 2024-05-18 LAB — TRYPTASE: Tryptase: 5.5 ug/L (ref <11.0)

## 2024-05-18 LAB — ALPHA-GAL PANEL
Allergen, Mutton, f88: 0.15 kU/L — ABNORMAL HIGH
Allergen, Pork, f26: 0.39 kU/L — ABNORMAL HIGH
Beef: <0.1 kU/L
CLASS: 0
CLASS: 1
GALACTOSE-ALPHA-1,3-GALACTOSE IGE*: 0.11 kU/L — ABNORMAL HIGH (ref <0.10)

## 2024-05-18 LAB — IGA: Immunoglobulin A: 340 mg/dL — ABNORMAL HIGH (ref 47–310)

## 2024-05-18 LAB — INTERPRETATION:

## 2024-05-18 LAB — TISSUE TRANSGLUTAMINASE, IGA: (tTG) Ab, IgA: <1 U/mL

## 2024-05-19 ENCOUNTER — Ambulatory Visit: Payer: Self-pay | Admitting: Internal Medicine

## 2024-06-12 LAB — HM MAMMOGRAPHY

## 2024-06-23 ENCOUNTER — Encounter: Payer: Self-pay | Admitting: Family Medicine

## 2024-06-23 ENCOUNTER — Ambulatory Visit: Admitting: Family Medicine

## 2024-06-23 ENCOUNTER — Other Ambulatory Visit (HOSPITAL_COMMUNITY): Payer: Self-pay

## 2024-06-23 ENCOUNTER — Telehealth: Payer: Self-pay

## 2024-06-23 VITALS — BP 136/78 | HR 75 | Temp 98.0°F | Ht 64.0 in | Wt 328.0 lb

## 2024-06-23 DIAGNOSIS — F411 Generalized anxiety disorder: Secondary | ICD-10-CM | POA: Diagnosis not present

## 2024-06-23 MED ORDER — AMITRIPTYLINE HCL 25 MG PO TABS: 25.0000 mg | ORAL_TABLET | Freq: Every day | ORAL | 0 refills | Status: DC

## 2024-06-23 NOTE — Progress Notes (Signed)
 Subjective:  HPI: Sarah Dean is a 58 y.o. female presenting on 06/23/2024 for Follow-up (Follow up from allergist )   HPI Patient is in today for medication to help with her anxiety and depression.  PMH includes RA, lymphadema, seasonal allergies, anxiety, asthma, cataracts, and GERD. She is followed by Dr Mai at [facility]. Has been evaluated by an allergist and gastro recently for her GI concerns related to alpha gal and multiple food intolerances. Diagnosed with multiple allergies, Mast Cell Activation Syndrome, alpha gal. Does experience constipation and diarrhea at times. Today she is concerned about her uncontrolled anxiety. She does see a therapist. Is overwhelmed with dietary limitations, GI upset, past trauma and medical conditions. Denies SI/HI.       06/23/2024   11:11 AM 11/29/2022    8:45 AM  GAD 7 : Generalized Anxiety Score  Nervous, Anxious, on Edge 3 0  Control/stop worrying 3 0  Worry too much - different things 3 0  Trouble relaxing 3 0  Restless 3 0  Easily annoyed or irritable 3 0  Afraid - awful might happen 3 0  Total GAD 7 Score 21 0  Anxiety Difficulty Somewhat difficult Not difficult at all       06/23/2024   11:11 AM 11/29/2022    8:44 AM 04/04/2018   10:31 AM 01/02/2018    1:40 PM 10/04/2017   10:05 AM  Depression screen PHQ 2/9  Decreased Interest 1 0 0 0 0  Down, Depressed, Hopeless 1 0 0 0 0  PHQ - 2 Score 2 0 0 0 0  Altered sleeping 0 0     Tired, decreased energy 2 0     Change in appetite 1 0     Feeling bad or failure about yourself  2 0     Trouble concentrating 0 0     Moving slowly or fidgety/restless 1 0     Suicidal thoughts 0 0     PHQ-9 Score 8 0     Difficult doing work/chores Somewhat difficult Not difficult at all          Review of Systems  All other systems reviewed and are negative.   Relevant past medical history reviewed and updated as indicated.   Past Medical History:  Diagnosis Date   Abnormal Pap smear  of cervix    yrs ago   Adenomatous colon polyp    Allergy     seasonal    Anemia    past hx as young woman    Anxiety 07/16/2021   Bursitis of shoulder, right 2021   Cataract    forming    Condyloma    GERD (gastroesophageal reflux disease)    Hemorrhoids    Morbid obesity (HCC)    Pneumonia    hx of    Rheumatoid arthritis (HCC) 2005   STD (sexually transmitted disease)    + trich   Tubal ectopic pregnancy    times 2     Past Surgical History:  Procedure Laterality Date   ANKLE SURGERY     CHOLECYSTECTOMY  04/25/07   COLONOSCOPY N/A 03/24/2015   MFM:ojmhz external hemorrhoids/moderate sized internal hemorrhoids/moderate diverticulosis in the sigmoid colon   COLONOSCOPY     COLONOSCOPY WITH PROPOFOL  N/A 03/01/2023   Procedure: COLONOSCOPY WITH PROPOFOL ;  Surgeon: Federico Rosario BROCKS, MD;  Location: [facility];  Service: Gastroenterology;  Laterality: N/A;   HEMORRHOID BANDING N/A 03/24/2015   Procedure: HEMORRHOID BANDING;  Surgeon: Margo CROME  Fields, MD;  [facility];  Service: Endoscopy;  Laterality: N/A;   LAPAROSCOPIC GASTRIC SLEEVE RESECTION N/A 08/13/2017   Procedure: LAPAROSCOPIC GASTRIC SLEEVE RESECTION, UPPER ENDOSCOPY HIATAL HERNIA REPAIR;  Surgeon: Kinsinger, Herlene Righter, MD;  Location: [facility];  Service: General;  Laterality: N/A;   POLYPECTOMY     POLYPECTOMY  03/01/2023   Procedure: POLYPECTOMY;  Surgeon: Federico Rosario BROCKS, MD;  Location[facility];  Service: Gastroenterology;;   TOTAL ABDOMINAL HYSTERECTOMY  04/25/07   BSO   WISDOM TOOTH EXTRACTION      Allergies and medications reviewed and updated.   Current Outpatient Medications:    Albuterol-Budesonide (AIRSUPRA ) 90-80 MCG/ACT AERO, Inhale 2 puffs into the lungs every 4 (four) hours as needed., Disp: 10.7 g, Rfl: 5   azelastine  (ASTELIN ) 0.1 % nasal spray, Place 1 spray into both nostrils daily. Use in each nostril as directed, Disp: 30 mL, Rfl: 5   budesonide-glycopyrrolate-formoterol (BREZTRI   AEROSPHERE) 160-9-4.8 MCG/ACT AERO inhaler, Inhale 2 puffs into the lungs 2 (two) times daily., Disp: 32.1 g, Rfl: 1   Cetirizine  HCl (ZYRTEC  ALLERGY  CHILDRENS PO), Take 10 mg by mouth., Disp: , Rfl:    EPINEPHRINE  0.3 mg/0.3 mL IJ SOAJ injection, INJECT 1 PEN INTRAMUSCULARLY AS  NEEDED FOR ALLERGIC RESPONSE AS  DIRECTED BY MD. SEEK MEDICAL  HELP AFTER USE, Disp: 2 each, Rfl: 1   fluticasone  (FLONASE ) 50 MCG/ACT nasal spray, USE 2 SPRAYS IN BOTH NOSTRILS  DAILY, Disp: 48 g, Rfl: 2   hydrocortisone  (ANUSOL -HC) 2.5 % rectal cream, Place 1 Application rectally 2 (two) times daily., Disp: 30 g, Rfl: 1   LUTEIN PO, Take by mouth., Disp: , Rfl:    Milk Thistle 1000 MG CAPS, Take 1,000 mg by mouth daily., Disp: , Rfl:    montelukast  (SINGULAIR ) 10 MG tablet, TAKE 1 TABLET BY MOUTH DAILY AT  BEDTIME, Disp: 90 tablet, Rfl: 1   Olopatadine  HCl 0.2 % SOLN, Apply 1 drop to eye daily as needed (itchy watery eyes)., Disp: 2.5 mL, Rfl: 5   OPZELURA  1.5 % CREA, APPLY TOPICALLY TWICE DAILY ON YOUR HANDS, Disp: 60 g, Rfl: 4   OVER THE COUNTER MEDICATION, Take 100 mg by mouth 2 (two) times daily. CBD capsules, Disp: , Rfl:    predniSONE  (STERAPRED UNI-PAK 48 TAB) 5 MG (48) TBPK tablet, Take 5 mg by mouth as needed (for alpha-gal flare)., Disp: , Rfl:    VITAMIN D  PO, Take by mouth., Disp: , Rfl:    albuterol (VENTOLIN HFA) 108 (90 Base) MCG/ACT inhaler, Inhale 1-2 puffs into the lungs every 6 (six) hours as needed for wheezing or shortness of breath., Disp: , Rfl:    Famotidine (PEPCID PO), Take by mouth., Disp: , Rfl:    linaclotide  (LINZESS ) 72 MCG capsule, Take 1 capsule (72 mcg total) by mouth daily before breakfast., Disp: 30 capsule, Rfl: 3  Allergies  Allergen Reactions   Alpha-Gal Anaphylaxis    Objective:   BP 136/78   Pulse 75   Temp 98 F (36.7 C)   Ht 5' 4 (1.626 m)   Wt (!) 328 lb 0.3 oz (148.8 kg)   LMP 04/25/2007   SpO2 98%   BMI 56.30 kg/m      06/23/2024   11:07 AM 05/14/2024    9:49  AM 03/28/2024   10:55 AM  Vitals with BMI  Height 5' 4 5' 4   Weight 328 lbs 325 lbs 330 lbs 8 oz  BMI 56.28 55.76  Systolic 136 140 887  Diastolic 78 82 68  Pulse 75  104     Physical Exam Vitals and nursing note reviewed.  Constitutional:      Appearance: Normal appearance. She is obese.  HENT:     Head: Normocephalic and atraumatic.  Skin:    General: Skin is warm and dry.  Neurological:     General: No focal deficit present.     Mental Status: She is alert and oriented to person, place, and time. Mental status is at baseline.  Psychiatric:        Mood and Affect: Mood normal. Affect is tearful.        Behavior: Behavior normal.        Thought Content: Thought content normal.        Judgment: Judgment normal.     Assessment & Plan:  Generalized anxiety disorder Assessment & Plan: Ms Donlon is here today with concerns regarding her worsening anxiety and depression related to her ongoing and uncontrolled GI problems. She sees a therapist as well as allergist and GI and has had an extensive workup over the last year. Her therapist as well as her GI suggested she try a medication to help manage her resulting anxiety. Today we discussed treatment options and she would like to proceed with amitriptyline  with hope it may provide multimodal relief of her depression/anxiety and GI symptoms. Will start Amitriptyline  25mg  daily and follow up in 4 weeks.       Follow up plan: Return in about 4 weeks (around 07/21/2024) for annual physical with labs 1 week prior.  Jeoffrey GORMAN Barrio, FNP

## 2024-06-23 NOTE — Telephone Encounter (Signed)
*  AA  Pharmacy Patient Advocate Encounter   Received notification from CoverMyMeds that prior authorization for Breztri  Aerosphere 160-9-4.8MCG/ACT aerosol  is required/requested.   Insurance verification completed.   The patient is insured through [facility] .   Per test claim: Refill too soon. PA is not needed at this time. Medication was filled 09/03. Next eligible fill date is 09/26.

## 2024-06-23 NOTE — Assessment & Plan Note (Signed)
 Sarah Dean is here today with concerns regarding her worsening anxiety and depression related to her ongoing and uncontrolled GI problems. She sees a therapist as well as allergist and GI and has had an extensive workup over the last year. Her therapist as well as her GI suggested she try a medication to help manage her resulting anxiety. Today we discussed treatment options and she would like to proceed with amitriptyline  with hope it may provide multimodal relief of her depression/anxiety and GI symptoms. Will start Amitriptyline  25mg  daily and follow up in 4 weeks.

## 2024-06-23 NOTE — Progress Notes (Signed)
 Pt would like referral to [facility] for multiple allergies.  Would like to discuss severe anxiety and testing for ADHD and Autism testing.

## 2024-07-01 ENCOUNTER — Encounter: Payer: Self-pay | Admitting: Allergy & Immunology

## 2024-07-08 ENCOUNTER — Ambulatory Visit: Payer: Self-pay | Attending: Cardiology | Admitting: Cardiology

## 2024-07-08 ENCOUNTER — Encounter: Payer: Self-pay | Admitting: Cardiology

## 2024-07-08 VITALS — BP 142/89 | HR 88 | Ht 65.0 in | Wt 325.0 lb

## 2024-07-08 DIAGNOSIS — Z136 Encounter for screening for cardiovascular disorders: Secondary | ICD-10-CM

## 2024-07-08 DIAGNOSIS — R42 Dizziness and giddiness: Secondary | ICD-10-CM

## 2024-07-08 NOTE — Progress Notes (Signed)
 Clinical Summary Sarah Dean is a 58 y.o.female seen today as a new consult, referred by Dr Iva for the following medical problems.    1.Dizziness - can feel heart racing, lightheaded, dizzy, nauseous.  - only occurs standing. Typically after house work or yard work, higher levels of exertion.  - goes to sit down,resolves after minutes - drinks yet icup water  x 2-3 per day of water . No caffeine, no EtOH - symptoms more likely if has not eatin anything or drank that day - chronic diarrhea, multiple loose BMs a day. - HR 89 to 106 with standing with orthostatics today, no significant change in bp - from allergy  clinic notes questions about possible mast cell activiation syndrome, which can have some overload with POTs   2.LE edema - prior evaluation by Dr Claudene - 2018 echo: LVEF 55-60%, no WMAs    Past Medical History:  Diagnosis Date   Abnormal Pap smear of cervix    yrs ago   Adenomatous colon polyp    Allergy     seasonal    Anemia    past hx as young woman    Anxiety 07/16/2021   Bursitis of shoulder, right 2021   Cataract    forming    Condyloma    GERD (gastroesophageal reflux disease)    Hemorrhoids    Morbid obesity (HCC)    Pneumonia    hx of    Rheumatoid arthritis (HCC) 2005   STD (sexually transmitted disease)    + trich   Tubal ectopic pregnancy    times 2     Allergies  Allergen Reactions   Alpha-Gal Anaphylaxis     Current Outpatient Medications  Medication Sig Dispense Refill   Albuterol-Budesonide (AIRSUPRA ) 90-80 MCG/ACT AERO Inhale 2 puffs into the lungs every 4 (four) hours as needed. 10.7 g 5   amitriptyline  (ELAVIL ) 25 MG tablet Take 1 tablet (25 mg total) by mouth at bedtime. 90 tablet 0   azelastine  (ASTELIN ) 0.1 % nasal spray Place 1 spray into both nostrils daily. Use in each nostril as directed 30 mL 5   budesonide-glycopyrrolate-formoterol (BREZTRI  AEROSPHERE) 160-9-4.8 MCG/ACT AERO inhaler Inhale 2 puffs into the  lungs 2 (two) times daily. 32.1 g 1   Cetirizine  HCl (ZYRTEC  ALLERGY  CHILDRENS PO) Take 10 mg by mouth.     EPINEPHRINE  0.3 mg/0.3 mL IJ SOAJ injection INJECT 1 PEN INTRAMUSCULARLY AS  NEEDED FOR ALLERGIC RESPONSE AS  DIRECTED BY MD. SEEK MEDICAL  HELP AFTER USE 2 each 1   fluticasone  (FLONASE ) 50 MCG/ACT nasal spray USE 2 SPRAYS IN BOTH NOSTRILS  DAILY 48 g 2   hydrocortisone  (ANUSOL -HC) 2.5 % rectal cream Place 1 Application rectally 2 (two) times daily. 30 g 1   LUTEIN PO Take by mouth.     Milk Thistle 1000 MG CAPS Take 1,000 mg by mouth daily.     montelukast  (SINGULAIR ) 10 MG tablet TAKE 1 TABLET BY MOUTH DAILY AT  BEDTIME 90 tablet 1   Olopatadine  HCl 0.2 % SOLN Apply 1 drop to eye daily as needed (itchy watery eyes). 2.5 mL 5   OPZELURA  1.5 % CREA APPLY TOPICALLY TWICE DAILY ON YOUR HANDS 60 g 4   OVER THE COUNTER MEDICATION Take 100 mg by mouth 2 (two) times daily. CBD capsules     predniSONE  (STERAPRED UNI-PAK 48 TAB) 5 MG (48) TBPK tablet Take 5 mg by mouth as needed (for alpha-gal flare).     VITAMIN D  PO  Take by mouth.     No current facility-administered medications for this visit.     Past Surgical History:  Procedure Laterality Date   ANKLE SURGERY     CHOLECYSTECTOMY  04/25/07   COLONOSCOPY N/A 03/24/2015   MFM:ojmhz external hemorrhoids/moderate sized internal hemorrhoids/moderate diverticulosis in the sigmoid colon   COLONOSCOPY     COLONOSCOPY WITH PROPOFOL  N/A 03/01/2023   Procedure: COLONOSCOPY WITH PROPOFOL ;  Surgeon: Federico Rosario BROCKS, MD;  Location: [facility];  Service: Gastroenterology;  Laterality: N/A;   HEMORRHOID BANDING N/A 03/24/2015   Procedure: HEMORRHOID BANDING;  Surgeon: Margo LITTIE Haddock, MD;  Location: [facility];  Service: Endoscopy;  Laterality: N/A;   LAPAROSCOPIC GASTRIC SLEEVE RESECTION N/A 08/13/2017   Procedure: LAPAROSCOPIC GASTRIC SLEEVE RESECTION, UPPER ENDOSCOPY HIATAL HERNIA REPAIR;  Surgeon: Kinsinger, Herlene Righter, MD;  Location: [facility];   Service: General;  Laterality: N/A;   POLYPECTOMY     POLYPECTOMY  03/01/2023   Procedure: POLYPECTOMY;  Surgeon: Federico Rosario BROCKS, MD;  Location: [facility];  Service: Gastroenterology;;   TOTAL ABDOMINAL HYSTERECTOMY  04/25/07   BSO   WISDOM TOOTH EXTRACTION       Allergies  Allergen Reactions   Alpha-Gal Anaphylaxis      Family History  Problem Relation Age of Onset   Cancer Father    Cirrhosis Father    Esophageal cancer Father    Alcohol abuse Father    Obesity Father    Atrial fibrillation Mother    Alcohol abuse Mother    Heart disease Mother    Obesity Mother    Colon cancer Neg Hx    Colon polyps Neg Hx    Rectal cancer Neg Hx    Stomach cancer Neg Hx      Social History Ms. Dorrance reports that she has quit smoking. She has never used smokeless tobacco. Ms. Engen reports no history of alcohol use.   Physical Examination Today's Vitals   07/08/24 1439  BP: (!) 142/89  Pulse: 88  SpO2: 97%  Weight: (!) 325 lb (147.4 kg)  Height: 5' 5 (1.651 m)   Body mass index is 54.08 kg/m.  Gen: resting comfortably, no acute distress HEENT: no scleral icterus, pupils equal round and reactive, no palptable cervical adenopathy,  CV: RRR, no m/rg,no jvd Resp: Clear to auscultation bilaterally GI: abdomen is soft, non-tender, non-distended, normal bowel sounds, no hepatosplenomegaly MSK: extremities are warm, no edema.  Skin: warm, no rash Neuro:  no focal deficits Psych: appropriate affect    Assessment and Plan  1.Dizziness - symptoms most consistent with orthostasis as they occur only with standing, often after high activity or not eating or drinking that day. Orthostatics are negative today however, though can be variable day to day - orthostatic vitals pattern not consistent with POTs - encouraged more regular hydration. Has some chronic fluid lossess with chronic diarrhea, multiple autoimmune/allergy  issues which may involve higher levels of  vasodilatory cytokines etc. -monitor symptoms with aggressive hydration, if ongoing consider compression garments. Encouraged increased elecrolyte intake.  - EKG shows NSR.      Dorn PHEBE Ross, M.D.

## 2024-07-08 NOTE — Patient Instructions (Signed)

## 2024-07-13 ENCOUNTER — Other Ambulatory Visit: Payer: Self-pay | Admitting: Family

## 2024-07-23 ENCOUNTER — Other Ambulatory Visit: Payer: Self-pay

## 2024-07-23 ENCOUNTER — Ambulatory Visit (INDEPENDENT_AMBULATORY_CARE_PROVIDER_SITE_OTHER): Admitting: Allergy & Immunology

## 2024-07-23 ENCOUNTER — Encounter: Payer: Self-pay | Admitting: Allergy & Immunology

## 2024-07-23 VITALS — HR 101 | Temp 97.1°F | Resp 18 | Ht 64.57 in | Wt 326.0 lb

## 2024-07-23 DIAGNOSIS — T782XXD Anaphylactic shock, unspecified, subsequent encounter: Secondary | ICD-10-CM

## 2024-07-23 DIAGNOSIS — D8940 Mast cell activation, unspecified: Secondary | ICD-10-CM

## 2024-07-23 NOTE — Progress Notes (Unsigned)
 FOLLOW UP  Date of Service/Encounter:  07/23/24   Assessment:   Seasonal and perennial allergic rhinitis (grasses, weeds, trees, molds, dust mite, dog, cat, horse, feathers, mouse)   Rheumatoid arthritis - now on Simponi with multiple prednisone  doses   Multiple prednisone  rounds due to uncontrolled RA   Recurrent bronchitis - better since starting Breztri    Stomach pain/cramping with GERD   Concern for mast cell activation syndrome - with elevated urinary leukotriene E4  Plan/Recommendations:   There are no Patient Instructions on file for this visit.   Subjective:   Sarah Dean is a 58 y.o. female presenting today for follow up of No chief complaint on file.   Sarah Dean has a history of the following: Patient Active Problem List   Diagnosis Date Noted   Generalized anxiety disorder 06/23/2024   Fatty liver 09/28/2023   History of colonic polyps 03/01/2023   Cervical radiculopathy 02/26/2023   Cervical spondylosis 02/26/2023   Neuropathic pain 02/26/2023   Neck pain 02/02/2023   Rheumatoid arthritis involving both knees, unspecified whether rheumatoid factor present (HCC) 11/29/2022   Encounter to establish care with new doctor 11/29/2022   Class 3 severe obesity due to excess calories without serious comorbidity with body mass index (BMI) of 50.0 to 59.9 in adult [facility]) 11/29/2022   Morbid obesity (HCC) 08/13/2017   Pre-operative cardiovascular examination 05/24/2017   Rectal discomfort 01/01/2015    History obtained from: chart review and patient.  Discussed the use of AI scribe software for clinical note transcription with the patient and/or guardian, who gave verbal consent to proceed.  Sarah Dean is a 58 y.o. female presenting for a follow up visit.  We last saw her in June 2025.  At that time, we continue with Flonase  as well as Astelin  and Singulair .  For her eczema, we continue with Opzelura  twice daily as needed.  She has an appointment  already scheduled for Dr. Alm.  We did not do lung testing.  We continue with Breztri  2 puffs twice daily as well as Ai 2 puffs every 4 hours as needed.  For POTS, refer her to cardiology.[email or web address]   In the meantime, she contacted asthma multicystic testing weekly and that we do not do that testing here.  We recommended that she go to [facility].   She contacted us  in the middle of September wanting to have a closer follow-up appointment.  She had a variety of symptoms she was describing via extensively long MyChart message.  Asthma/Respiratory Symptom History: ***  Allergic Rhinitis Symptom History: ***  Food Allergy  Symptom History: ***  Skin Symptom History: ***  GERD Symptom History: ***  Infection Symptom History: ***  Otherwise, there have been no changes to her past medical history, surgical history, family history, or social history.    Review of systems otherwise negative other than that mentioned in the HPI.    Objective:   Last menstrual period 04/25/2007. There is no height or weight on file to calculate BMI.    Physical Exam   Diagnostic studies: {Blank single:19197::none,deferred due to recent antihistamine use,deferred due to insurance stipulations that require a separate visit for testing,labs sent instead, }  Spirometry: {Blank single:19197::results normal (FEV1: ***%, FVC: ***%, FEV1/FVC: ***%),results abnormal (FEV1: ***%, FVC: ***%, FEV1/FVC: ***%)}.    {Blank single:19197::Spirometry consistent with mild obstructive disease,Spirometry consistent with moderate obstructive disease,Spirometry consistent with severe obstructive disease,Spirometry consistent with possible restrictive disease,Spirometry consistent with mixed obstructive and restrictive disease,Spirometry uninterpretable due to  technique,Spirometry consistent with normal pattern}. {Blank single:19197::Albuterol/Atrovent nebulizer,Xopenex/Atrovent  nebulizer,Albuterol nebulizer,Albuterol four puffs via MDI,Xopenex four puffs via MDI} treatment given in clinic with {Blank single:19197::significant improvement in FEV1 per ATS criteria,significant improvement in FVC per ATS criteria,significant improvement in FEV1 and FVC per ATS criteria,improvement in FEV1, but not significant per ATS criteria,improvement in FVC, but not significant per ATS criteria,improvement in FEV1 and FVC, but not significant per ATS criteria,no improvement}.  Allergy  Studies: {Blank single:19197::none,deferred due to recent antihistamine use,deferred due to insurance stipulations that require a separate visit for testing,labs sent instead, }    {Blank single:19197::Allergy  testing results were read and interpreted by myself, documented by clinical staff., }      Marty Shaggy, MD  [facility] 

## 2024-07-23 NOTE — Patient Instructions (Addendum)
 1. Seasonal and perennial allergic rhinitis - Continue with the Flonase  one spray per nostril daily. - Continue with the Astelin  one spray per nostril daily.  - Continue with Singulair  (montelukast ) to help with your allergies and your breathing. - The Xolair would help with your sinuses, GI issues, and skin, so this might be the best route to take.   2. Dyshidrotic eczema - Continue with Opzelura  twice daily on your hands. - Appointment scheduled for October 13th with Dr. Alm.   4. Moderate persistent asthma, uncomplicated - Lung testing not done today. - Daily controller medication(s): Breztri  two puffs twice daily with spacer - Prior to physical activity: AirSupra  two puff 10-15 minutes before physical activity. - Rescue medications: AirSupra  two puffs every 4-6 hours as needed.  - Asthma control goals:  * Full participation in all desired activities (may need albuterol before activity) * Albuterol use two time or less a week on average (not counting use with activity) * Cough interfering with sleep two time or less a month * Oral steroids no more than once a year * No hospitalizations  5. Concern for mast cell activation syndrome - with elevated leukotriene E4  - Continue with Quercetin daily.  - Continue with Luteolin daily. - Continue with famotidine 20mg  daily.  - Continue Singulair  (montelukast ) - Continue cetirizine  10mg  daily.  - Start cromolyn  5 mL for 30 minutes before your meals (OK to wait for the Xolair for a couple of months before starting this).  - We are going to get a tryptase to check on this since you are having a flare right now.  - Continue to avoid all of your triggering foods (especially black bean and hazelnut).  - Continue to avoid red meat due to elevated alpha gal,  black bean, hazelnut and all other foods that bother you - EpiPen  is up to date.   4. POTS - Continue to follow with Dr. Alvan.   5. Return in about 3 months (around 10/23/2024). You can  have the follow up appointment with Dr. Iva or a Nurse Practicioner (our Nurse Practitioners are excellent and always have Physician oversight!).    Please inform us  of any Emergency Department visits, hospitalizations, or changes in symptoms. Call us  before going to the ED for breathing or allergy  symptoms since we might be able to fit you in for a sick visit. Feel free to contact us  anytime with any questions, problems, or concerns.  It was a pleasure to see you again today!  Websites that have reliable patient information: 1. American Academy of Asthma, Allergy , and Immunology: www.aaaai.org 2. Food Allergy  Research and Education (FARE): foodallergy.org 3. Mothers of Asthmatics: http://www.asthmacommunitynetwork.org 4. American College of Allergy , Asthma, and Immunology: www.acaai.org      "Like" us  on Facebook and Instagram for our latest updates!      A healthy democracy works best when [employer] participate! Make sure you are registered to vote! If you have moved or changed any of your contact information, you will need to get this updated before voting! Scan the QR codes below to learn more!

## 2024-07-25 LAB — TRYPTASE: Tryptase: 6.3 ug/L (ref 2.2–13.2)

## 2024-07-28 ENCOUNTER — Encounter: Payer: Self-pay | Admitting: Dermatology

## 2024-07-28 ENCOUNTER — Ambulatory Visit: Payer: Self-pay | Admitting: Dermatology

## 2024-07-28 VITALS — BP 134/70

## 2024-07-28 DIAGNOSIS — L409 Psoriasis, unspecified: Secondary | ICD-10-CM

## 2024-07-28 DIAGNOSIS — L304 Erythema intertrigo: Secondary | ICD-10-CM | POA: Diagnosis not present

## 2024-07-28 NOTE — Patient Instructions (Addendum)
 VISIT SUMMARY:  Today, we evaluated your chronic hand dermatitis and other skin conditions. We discussed your current symptoms, treatments, and potential adjustments to your care plan.  YOUR PLAN:  -PSORIASIS:  Psoriasis is a chronic skin condition that causes red, scaly patches, often worsening in winter.  You should continue using Opzelura  cream for up to eight weeks until the lesions clear, then apply it two to three times a week to prevent recurrence.  Consider using moisturizers with exfoliating agents like La Roche-Posay with urea, Avene retinol, or Amlactin, ensuring they do not contain mammalian products.  Samples of recommended moisturizers will be provided. We will reassess in the spring to evaluate the effectiveness of Opzelura  and consider other treatments if necessary.  -INTERTRIGO:  Intertrigo is a rash that occurs in skin folds, often due to moisture and friction, and can be worsened by yeast overgrowth. You should continue using Lotrimin antifungal spray powder as needed to manage this condition.  INSTRUCTIONS:  Please follow up in the spring to reassess the effectiveness of Opzelura  and discuss potential systemic treatments if necessary.     Important Information  Due to recent changes in healthcare laws, you may see results of your pathology and/or laboratory studies on MyChart before the doctors have had a chance to review them. We understand that in some cases there may be results that are confusing or concerning to you. Please understand that not all results are received at the same time and often the doctors may need to interpret multiple results in order to provide you with the best plan of care or course of treatment. Therefore, we ask that you please give us  2 business days to thoroughly review all your results before contacting the office for clarification. Should we see a critical lab result, you will be contacted sooner.   If You Need Anything After Your  Visit  If you have any questions or concerns for your doctor, please call our main line at [phone] If no one answers, please leave a voicemail as directed and we will return your call as soon as possible. Messages left after 4 pm will be answered the following business day.   You may also send us  a message via MyChart. We typically respond to MyChart messages within 1-2 business days.  For prescription refills, please ask your pharmacy to contact our office. Our fax number is [phone].  If you have an urgent issue when the clinic is closed that cannot wait until the next business day, you can page your doctor at the number below.    Please note that while we do our best to be available for urgent issues outside of office hours, we are not available 24/7.   If you have an urgent issue and are unable to reach us , you may choose to seek medical care at your doctor's office, retail clinic, urgent care center, or emergency room.  If you have a medical emergency, please immediately call 911 or go to the emergency department. In the event of inclement weather, please call our main line at [phone] for an update on the status of any delays or closures.  Dermatology Medication Tips: Please keep the boxes that topical medications come in in order to help keep track of the instructions about where and how to use these. Pharmacies typically print the medication instructions only on the boxes and not directly on the medication tubes.   If your medication is too expensive, please contact our office at [phone] or send  us  a message through MyChart.   We are unable to tell what your co-pay for medications will be in advance as this is different depending on your insurance coverage. However, we may be able to find a substitute medication at lower cost or fill out paperwork to get insurance to cover a needed medication.   If a prior authorization is required to get your medication covered by  your insurance company, please allow us  1-2 business days to complete this process.  Drug prices often vary depending on where the prescription is filled and some pharmacies may offer cheaper prices.  The website www.goodrx.com contains coupons for medications through different pharmacies. The prices here do not account for what the cost may be with help from insurance (it may be cheaper with your insurance), but the website can give you the price if you did not use any insurance.  - You can print the associated coupon and take it with your prescription to the pharmacy.  - You may also stop by our office during regular business hours and pick up a GoodRx coupon card.  - If you need your prescription sent electronically to a different pharmacy, notify our office through [facility] or by phone at [phone]

## 2024-07-28 NOTE — Progress Notes (Signed)
   New Patient Visit   Subjective  Sarah Dean is a 58 y.o. female who presents for a NEW PATIENT appointment to be examined for the concerns as listed below.  Sarah Dean is a 58 year old female with chronic hand dermatitis who presents for evaluation of her skin condition.  She has a history of chronic hand dermatitis, which worsens during the winter months, affecting her hands and elbows. Her fingers become cracked and sometimes bleed. The severity fluctuates, and she reports that her hands have improved, but a persistent spot remains.  She experiences similar skin issues on her elbows and thighs, appearing as circular scaly spots, particularly in winter. Lotion use on her elbows has provided some improvement. She has been using Opzelura  since November of the previous year, applying it twice daily for about a week at a time, but often forgets to continue once improvement is noted. She has two or three tubes of Opzelura  at home and finds it somewhat effective.  She mentions a rash under her bully fold, especially when sweaty, which is alleviated by Lotrimin antifungal spray powder.  She has a known allergy  to mammalian products due to alpha-gal syndrome, limiting her use of certain moisturizers. She currently uses Vanicream as it does not contain mammalian ingredients and has difficulty finding other suitable products, often verifying ingredients with manufacturers.   Are you nursing, pregnant or trying to conceive? No   Hx of Bx - AK.  No family Hx of skin cancer.   The following portions of the chart were reviewed this encounter and updated as appropriate: medications, allergies, medical history  Review of Systems:  No other skin or systemic complaints except as noted in HPI or Assessment and Plan.  Objective  Well appearing patient in no apparent distress; mood and affect are within normal limits.   A focused examination was performed of the following areas: L  palm   Relevant exam findings are noted in the Assessment and Plan.     Assessment & Plan   Psoriasis Chronic psoriasis affecting hands, elbows, and thighs, with winter flares. Characterized by circular scaly spots and plaques. Exacerbated by stress and reduced sunlight exposure, leading to decreased vitamin D  levels. Opzelura  preferred due to lack of side effects compared to steroid creams. BSA: 2%, IGA 1  - Continue Opzelura  cream for up to eight weeks until lesions clear, then apply two to three times a week to prevent recurrence. - Consider moisturizers with exfoliating agents such as La Roche-Posay with urea, Avene retinol, or Amlactin, ensuring she does not contain mammalian products. - Provide samples of recommended moisturizers. - Reassess in spring to evaluate Opzelura 's effectiveness and consider systemic treatments if necessary.  Intertrigo Intertrigo presenting as rashes in skin folds, particularly when sweaty. Responds well to Lotrimin antifungal spray powder, indicating yeast overgrowth rather than psoriasis. - Continue Lotrimin antifungal spray powder as needed.   No follow-ups on file.   Documentation: I have reviewed the above documentation for accuracy and completeness, and I agree with the above.  I, Shirron Maranda, CMA, am acting as scribe for [employer], DO.   Delon Lenis, DO

## 2024-07-29 ENCOUNTER — Ambulatory Visit (INDEPENDENT_AMBULATORY_CARE_PROVIDER_SITE_OTHER): Admitting: Family Medicine

## 2024-07-29 ENCOUNTER — Encounter: Payer: Self-pay | Admitting: Family Medicine

## 2024-07-29 ENCOUNTER — Encounter: Payer: Self-pay | Admitting: Allergy & Immunology

## 2024-07-29 VITALS — BP 115/84 | HR 73 | Temp 98.2°F | Ht 64.57 in | Wt 323.0 lb

## 2024-07-29 DIAGNOSIS — Z0001 Encounter for general adult medical examination with abnormal findings: Secondary | ICD-10-CM

## 2024-07-29 DIAGNOSIS — Z23 Encounter for immunization: Secondary | ICD-10-CM

## 2024-07-29 DIAGNOSIS — M069 Rheumatoid arthritis, unspecified: Secondary | ICD-10-CM | POA: Diagnosis not present

## 2024-07-29 DIAGNOSIS — F411 Generalized anxiety disorder: Secondary | ICD-10-CM

## 2024-07-29 DIAGNOSIS — K5903 Drug induced constipation: Secondary | ICD-10-CM

## 2024-07-29 DIAGNOSIS — J3089 Other allergic rhinitis: Secondary | ICD-10-CM

## 2024-07-29 DIAGNOSIS — J45909 Unspecified asthma, uncomplicated: Secondary | ICD-10-CM

## 2024-07-29 DIAGNOSIS — Z1321 Encounter for screening for nutritional disorder: Secondary | ICD-10-CM

## 2024-07-29 DIAGNOSIS — L409 Psoriasis, unspecified: Secondary | ICD-10-CM

## 2024-07-29 DIAGNOSIS — Z0000 Encounter for general adult medical examination without abnormal findings: Secondary | ICD-10-CM | POA: Insufficient documentation

## 2024-07-29 DIAGNOSIS — D8940 Mast cell activation, unspecified: Secondary | ICD-10-CM

## 2024-07-29 NOTE — Assessment & Plan Note (Signed)
 followed by [name], continue Opzelura 

## 2024-07-29 NOTE — Assessment & Plan Note (Signed)
 followed by Rheum, continue Simponi

## 2024-07-29 NOTE — Assessment & Plan Note (Signed)
 followed by asthma and allergy , continue Breztri  and PRN AirSupra  Should notify office if you are using your Albuterol inhaler more than 2 times weekly, wake up with asthma more than 2 times monthly, or need refills for rescue inhaler more than 2 times per year.

## 2024-07-29 NOTE — Assessment & Plan Note (Signed)
 followed by GI, avoiding trigger foods, daily fiber, Mag ox 400mg  daily, Linzess  72mcg daily, Amitriptyline  25mg  daily

## 2024-07-29 NOTE — Assessment & Plan Note (Signed)
 followed by asthma and allergy , on Quercetin, Luteolin, famotidine, singulair , zyrtec 

## 2024-07-29 NOTE — Progress Notes (Signed)
 Complete physical exam  Patient: Sarah Dean   DOB: Feb 19, 1966   58 y.o. Female  MRN: [id number]  Subjective:    Chief Complaint  Patient presents with   Annual Exam    CPE  Having severe RA symptoms after treatment     Sarah Dean is a 58 y.o. female who presents today for a complete physical exam. She reports consuming a meat free diet. Exercise includes walking and gardening She generally feels fairly well. She reports sleeping fairly well. She does not have additional problems to discuss today.   PMH includes RA, lymphadema, seasonal allergies, alpha gal, anxiety, asthma, cataracts, and GERD. She is followed by Dr Mai at [facility]. Has been evaluated by an allergist and gastro recently for her GI concerns related to alpha gal and multiple food intolerances.  Has also established with dermatology recently for her intertrigo and psoriasis.  RA: followed by Rheum, on Simponi and prednisone   Allergies: followed by asthma and allergy , on flonase , astelin , singulair , Xolair  Asthma: followed by asthma and allergy , on Breztri  and PRN AirSupra   Psoriasis: followed by Derm, on Opzelura   Mast cell activation syndrome: followed by asthma and allergy , on Quercetin, Luteolin, famotidine, singulair , zyrtec   Constipation: followed by GI, avoiding trigger foods, daily fiber, Linzess  72mcg daily, Amitriptyline   Dizziness: followed by Cardiology, due to orthostasis, staying hydrated and using compression stockings  Anxiety: related to health conditions, improved on Amitriptyline  25mg  daily   Most recent fall risk assessment:    07/29/2024    9:20 AM  Fall Risk   Falls in the past year? 0  Number falls in past yr: 0  Injury with Fall? 0  Risk for fall due to : No Fall Risks  Follow up Falls evaluation completed     Most recent depression screenings:    07/29/2024    9:20 AM 06/23/2024   11:11 AM  PHQ 2/9 Scores  PHQ - 2 Score 2 2  PHQ- 9 Score 7 8     Vision:Within last year and Dental: No current dental problems and Receives regular dental care  Patient Active Problem List   Diagnosis Date Noted   Physical exam, annual 07/29/2024   Mild asthma without complication 07/29/2024   Psoriasis 07/29/2024   Mast cell activation syndrome 07/29/2024   Drug-induced constipation 07/29/2024   Generalized anxiety disorder 06/23/2024   Fatty liver 09/28/2023   History of colonic polyps 03/01/2023   Cervical radiculopathy 02/26/2023   Cervical spondylosis 02/26/2023   Neuropathic pain 02/26/2023   Neck pain 02/02/2023   Rheumatoid arthritis involving both knees, unspecified whether rheumatoid factor present (HCC) 11/29/2022   Class 3 severe obesity due to excess calories without serious comorbidity with body mass index (BMI) of 50.0 to 59.9 in adult (HCC) 11/29/2022   Morbid obesity (HCC) 08/13/2017   Past Medical History:  Diagnosis Date   Abnormal Pap smear of cervix    yrs ago   Adenomatous colon polyp    Allergy     seasonal    Anemia    past hx as young woman    Anxiety 07/16/2021   Asthma 11/24   Bursitis of shoulder, right 2021   Cataract    forming    Condyloma    Depression 02/2008   GERD (gastroesophageal reflux disease)    Hemorrhoids    Morbid obesity (HCC)    Pneumonia    hx of    Rheumatoid arthritis (HCC) 2005   STD (sexually transmitted disease)    +  trich   Tubal ectopic pregnancy    times 2   Past Surgical History:  Procedure Laterality Date   ANKLE SURGERY     CHOLECYSTECTOMY  04/25/2007   COLONOSCOPY N/A 03/24/2015   MFM:ojmhz external hemorrhoids/moderate sized internal hemorrhoids/moderate diverticulosis in the sigmoid colon   COLONOSCOPY     COLONOSCOPY WITH PROPOFOL  N/A 03/01/2023   Procedure: COLONOSCOPY WITH PROPOFOL ;  Surgeon: Federico Rosario BROCKS, MD;  Location: [facility];  Service: Gastroenterology;  Laterality: N/A;   HEMORRHOID BANDING N/A 03/24/2015   Procedure: HEMORRHOID BANDING;   Surgeon: Margo LITTIE Haddock, MD;  Location: [facility];  Service: Endoscopy;  Laterality: N/A;   HERNIA REPAIR  03/2018   LAPAROSCOPIC GASTRIC SLEEVE RESECTION N/A 08/13/2017   Procedure: LAPAROSCOPIC GASTRIC SLEEVE RESECTION, UPPER ENDOSCOPY HIATAL HERNIA REPAIR;  Surgeon: Kinsinger, Herlene Righter, MD;  Location: [facility];  Service: General;  Laterality: N/A;   POLYPECTOMY     POLYPECTOMY  03/01/2023   Procedure: POLYPECTOMY;  Surgeon: Federico Rosario BROCKS, MD;  Location: [facility];  Service: Gastroenterology;;   TOTAL ABDOMINAL HYSTERECTOMY  04/25/2007   BSO   WISDOM TOOTH EXTRACTION     Social History   Tobacco Use   Smoking status: Former    Current packs/day: 0.00    Types: Cigarettes    Quit date: 10/16/1993    Years since quitting: 30.8   Smokeless tobacco: Never   Tobacco comments:    in high school  Vaping Use   Vaping status: Never Used  Substance Use Topics   Alcohol use: No    Comment: rarely   Drug use: Yes    Types: Marijuana    Comment: occ   Family History  Problem Relation Age of Onset   Cancer Father    Cirrhosis Father    Esophageal cancer Father    Alcohol abuse Father    Obesity Father    Atrial fibrillation Mother    Alcohol abuse Mother    Heart disease Mother    Obesity Mother    Anxiety disorder Sister    Colon cancer Neg Hx    Colon polyps Neg Hx    Rectal cancer Neg Hx    Stomach cancer Neg Hx    Allergies  Allergen Reactions   Alpha-Gal Anaphylaxis      Patient Care Team: Kayla Jeoffrey RAMAN, FNP as PCP - General (Family Medicine) Alvan, Dorn FALCON, MD as PCP - Cardiology (Cardiology) Shaaron Lamar HERO, MD as Consulting Physician (Gastroenterology) Cathlyn BROCKS Cary, Bobie BRAVO, MD as Consulting Physician (Obstetrics and Gynecology)   Outpatient Medications Prior to Visit  Medication Sig   Albuterol-Budesonide (AIRSUPRA ) 90-80 MCG/ACT AERO Inhale 2 puffs into the lungs every 4 (four) hours as needed.   amitriptyline  (ELAVIL ) 25 MG tablet Take 1  tablet (25 mg total) by mouth at bedtime.   azelastine  (ASTELIN ) 0.1 % nasal spray Place 1 spray into both nostrils daily. Use in each nostril as directed   budesonide-glycopyrrolate-formoterol (BREZTRI  AEROSPHERE) 160-9-4.8 MCG/ACT AERO inhaler Inhale 2 puffs into the lungs 2 (two) times daily.   Cetirizine  HCl (ZYRTEC  ALLERGY  CHILDRENS PO) Take 10 mg by mouth.   EPINEPHRINE  0.3 mg/0.3 mL IJ SOAJ injection INJECT 1 PEN INTRAMUSCULARLY AS  NEEDED FOR ALLERGIC RESPONSE AS  DIRECTED BY MD. [facility]  HELP AFTER USE   fluticasone  (FLONASE ) 50 MCG/ACT nasal spray USE 2 SPRAYS IN BOTH NOSTRILS  DAILY   hydrocortisone  (ANUSOL -HC) 2.5 % rectal cream Place 1 Application rectally 2 (two) times  daily.   LUTEIN PO Take by mouth.   Milk Thistle 1000 MG CAPS Take 1,000 mg by mouth daily.   montelukast  (SINGULAIR ) 10 MG tablet TAKE 1 TABLET BY MOUTH DAILY AT  BEDTIME   Olopatadine  HCl 0.2 % SOLN Apply 1 drop to eye daily as needed (itchy watery eyes).   OPZELURA  1.5 % CREA APPLY TOPICALLY TWICE DAILY ON YOUR HANDS   OVER THE COUNTER MEDICATION Take 100 mg by mouth 2 (two) times daily. CBD capsules   predniSONE  (STERAPRED UNI-PAK 48 TAB) 5 MG (48) TBPK tablet Take 5 mg by mouth as needed (for alpha-gal flare).   VITAMIN D  PO Take by mouth.   No facility-administered medications prior to visit.    Review of Systems  Constitutional: Negative.   HENT: Negative.    Eyes: Negative.   Respiratory: Negative.    Cardiovascular: Negative.   Gastrointestinal:  Positive for constipation.  Genitourinary: Negative.   Musculoskeletal:  Positive for joint pain.  Skin:  Positive for itching.  Neurological:  Positive for dizziness.  Endo/Heme/Allergies: Negative.   Psychiatric/Behavioral:  The patient is nervous/anxious.   All other systems reviewed and are negative.         Objective:     BP 115/84   Pulse 73   Temp 98.2 F (36.8 C)   Ht 5' 4.57 (1.64 m)   Wt (!) 323 lb (146.5 kg)   LMP  04/25/2007   SpO2 99%   BMI 54.47 kg/m  BP Readings from Last 3 Encounters:  07/29/24 115/84  07/28/24 134/70  07/08/24 (!) 142/89   Wt Readings from Last 3 Encounters:  07/29/24 (!) 323 lb (146.5 kg)  07/23/24 (!) 326 lb (147.9 kg)  07/08/24 (!) 325 lb (147.4 kg)      Physical Exam Vitals and nursing note reviewed.  Constitutional:      Appearance: Normal appearance. She is obese.  HENT:     Head: Normocephalic and atraumatic.     Right Ear: Tympanic membrane, ear canal and external ear normal.     Left Ear: Tympanic membrane, ear canal and external ear normal.     Nose: Nose normal.     Mouth/Throat:     Mouth: Mucous membranes are moist.     Pharynx: Oropharynx is clear.  Eyes:     Extraocular Movements: Extraocular movements intact.     Conjunctiva/sclera: Conjunctivae normal.     Pupils: Pupils are equal, round, and reactive to light.  Cardiovascular:     Rate and Rhythm: Normal rate and regular rhythm.     Pulses: Normal pulses.     Heart sounds: Normal heart sounds.  Pulmonary:     Effort: Pulmonary effort is normal.     Breath sounds: Normal breath sounds.  Abdominal:     General: Bowel sounds are normal.     Palpations: Abdomen is soft.  Musculoskeletal:        General: Normal range of motion.     Cervical back: Normal range of motion and neck supple.  Skin:    General: Skin is warm and dry.     Capillary Refill: Capillary refill takes less than 2 seconds.  Neurological:     General: No focal deficit present.     Mental Status: She is alert and oriented to person, place, and time. Mental status is at baseline.  Psychiatric:        Mood and Affect: Mood normal. Affect is tearful.  Behavior: Behavior normal.        Thought Content: Thought content normal.        Judgment: Judgment normal.      No results found for any visits on 07/29/24. Last CBC Lab Results  Component Value Date   WBC 5.3 11/29/2022   HGB 13.7 11/29/2022   HCT 41.1  11/29/2022   MCV 86.9 11/29/2022   MCH 29.0 11/29/2022   RDW 13.5 11/29/2022   PLT 234 11/29/2022   Last metabolic panel Lab Results  Component Value Date   GLUCOSE 85 11/29/2022   NA 141 11/29/2022   K 4.5 11/29/2022   CL 105 11/29/2022   CO2 27 11/29/2022   BUN 15 11/29/2022   CREATININE 0.59 11/29/2022   EGFR 107 11/29/2022   CALCIUM 9.0 11/29/2022   PROT 7.0 11/29/2022   ALBUMIN 3.4 (L) 08/14/2017   BILITOT 0.8 11/29/2022   ALKPHOS 46 08/14/2017   AST 19 11/29/2022   ALT 22 11/29/2022   ANIONGAP 8 08/14/2017   Last lipids Lab Results  Component Value Date   CHOL 168 11/29/2022   HDL 48 (L) 11/29/2022   LDLCALC 99 11/29/2022   TRIG 115 11/29/2022   CHOLHDL 3.5 11/29/2022   Last hemoglobin A1c Lab Results  Component Value Date   HGBA1C 5.0 04/08/2014   Last thyroid  functions Lab Results  Component Value Date   TSH 2.70 11/29/2022   Last vitamin D  Lab Results  Component Value Date   VD25OH 63 04/08/2014   Last vitamin B12 and Folate No results found for: VITAMINB12, FOLATE      Assessment & Plan:    Routine Health Maintenance and Physical Exam  Immunization History  Administered Date(s) Administered   Influenza-Unspecified 05/28/2017   Pneumococcal Polysaccharide-23 03/13/2007   Tdap 10/16/2008, 05/14/2019    Health Maintenance  Topic Date Due   COVID-19 Vaccine (1) 08/14/2024 (Originally 03/06/1971)   Zoster Vaccines- Shingrix (1 of 2) 10/29/2024 (Originally 03/07/1987)   Influenza Vaccine  01/13/2025 (Originally 05/16/2024)   Pneumococcal Vaccine: 50+ Years (2 of 2 - PCV) 07/29/2025 (Originally 03/12/2008)   Hepatitis B Vaccines 19-59 Average Risk (1 of 3 - 19+ 3-dose series) 07/29/2025 (Originally 03/07/1987)   Colonoscopy  02/28/2026   Mammogram  06/12/2026   DTaP/Tdap/Td (3 - Td or Tdap) 05/13/2029   Hepatitis C Screening  Completed   HIV Screening  Completed   HPV VACCINES  Aged Out   Meningococcal B Vaccine  Aged Out    Discussed  health benefits of physical activity, and encouraged her to engage in regular exercise appropriate for her age and condition.  Problem List Items Addressed This Visit     Morbid obesity (HCC)   Counseled on importance of weight management for overall health. Encouraged low calorie, heart healthy diet and moderate intensity exercise 150 minutes weekly. This is 3-5 times weekly for 30-50 minutes each session. Goal should be pace of 3 miles/hours, or walking 1.5 miles in 30 minutes and include strength training.      Relevant Orders   CBC with Differential/Platelet   Comprehensive metabolic panel with GFR   Lipid panel   TSH   Hemoglobin A1c   VITAMIN D  25 Hydroxy (Vit-D Deficiency, Fractures)   Rheumatoid arthritis involving both knees, unspecified whether rheumatoid factor present (HCC)   followed by Rheum, continue Simponi      Relevant Orders   VITAMIN D  25 Hydroxy (Vit-D Deficiency, Fractures)   Generalized anxiety disorder   Continue Amitriptyline  25mg  daily.  Will notify office if this is not controlling her symptoms. Working with psychiatry.     07/29/2024    9:20 AM 06/23/2024   11:11 AM 11/29/2022    8:45 AM  GAD 7 : Generalized Anxiety Score  Nervous, Anxious, on Edge 1 3 0  Control/stop worrying 1 3 0  Worry too much - different things 1 3 0  Trouble relaxing 1 3 0  Restless 1 3 0  Easily annoyed or irritable 1 3 0  Afraid - awful might happen 1 3 0  Total GAD 7 Score 7 21 0  Anxiety Difficulty Somewhat difficult Somewhat difficult Not difficult at all          Relevant Orders   VITAMIN D  25 Hydroxy (Vit-D Deficiency, Fractures)   Physical exam, annual - Primary   Today your medical history was reviewed and routine physical exam with labs was performed. Recommend 150 minutes of moderate intensity exercise weekly and consuming a well-balanced diet. Advised to stop smoking if a smoker, avoid smoking if a non-smoker, limit alcohol consumption to 1 drink per day for  women and 2 drinks per day for men, and avoid illiciCounseled on safe sex practices and offered STI testing today. Counseled on the importance of sunscreen use. Counseled in mental health awareness and when to seek medical care. Vaccine maintenance discussed. Appropriate health maintenance items reviewed. Return to office in 1 year for annual physical exam.       Relevant Orders   CBC with Differential/Platelet   Comprehensive metabolic panel with GFR   Lipid panel   TSH   Hemoglobin A1c   VITAMIN D  25 Hydroxy (Vit-D Deficiency, Fractures)   Mild asthma without complication   followed by asthma and allergy , continue Breztri  and PRN AirSupra  Should notify office if you are using your Albuterol inhaler more than 2 times weekly, wake up with asthma more than 2 times monthly, or need refills for rescue inhaler more than 2 times per year.       Psoriasis   followed by [name], continue Opzelura       Mast cell activation syndrome   followed by asthma and allergy , on Quercetin, Luteolin, famotidine, singulair , zyrtec       Drug-induced constipation   followed by GI, avoiding trigger foods, daily fiber, Mag ox 400mg  daily, Linzess  72mcg daily, Amitriptyline  25mg  daily      Other Visit Diagnoses       Encounter for vitamin deficiency screening       Relevant Orders   VITAMIN D  25 Hydroxy (Vit-D Deficiency, Fractures)     Environmental and seasonal allergies          Return in about 1 year (around 07/29/2025) for annual physical with labs 1 week prior.     Jeoffrey GORMAN Barrio, FNP

## 2024-07-29 NOTE — Assessment & Plan Note (Signed)
 Continue Amitriptyline  25mg  daily. Will notify office if this is not controlling her symptoms. Working with psychiatry.     07/29/2024    9:20 AM 06/23/2024   11:11 AM 11/29/2022    8:45 AM  GAD 7 : Generalized Anxiety Score  Nervous, Anxious, on Edge 1 3 0  Control/stop worrying 1 3 0  Worry too much - different things 1 3 0  Trouble relaxing 1 3 0  Restless 1 3 0  Easily annoyed or irritable 1 3 0  Afraid - awful might happen 1 3 0  Total GAD 7 Score 7 21 0  Anxiety Difficulty Somewhat difficult Somewhat difficult Not difficult at all

## 2024-07-29 NOTE — Assessment & Plan Note (Signed)
 Counseled on importance of weight management for overall health. Encouraged low calorie, heart healthy diet and moderate intensity exercise 150 minutes weekly. This is 3-5 times weekly for 30-50 minutes each session. Goal should be pace of 3 miles/hours, or walking 1.5 miles in 30 minutes and include strength training.

## 2024-07-29 NOTE — Assessment & Plan Note (Signed)
 Today your medical history was reviewed and routine physical exam with labs was performed. Recommend 150 minutes of moderate intensity exercise weekly and consuming a well-balanced diet. Advised to stop smoking if a smoker, avoid smoking if a non-smoker, limit alcohol consumption to 1 drink per day for women and 2 drinks per day for men, and avoid illiciCounseled on safe sex practices and offered STI testing today. Counseled on the importance of sunscreen use. Counseled in mental health awareness and when to seek medical care. Vaccine maintenance discussed. Appropriate health maintenance items reviewed. Return to office in 1 year for annual physical exam.

## 2024-07-30 ENCOUNTER — Ambulatory Visit: Payer: Self-pay | Admitting: Family Medicine

## 2024-07-30 LAB — CBC WITH DIFFERENTIAL/PLATELET
Absolute Lymphocytes: 1690 {cells}/uL (ref 850–3900)
Absolute Monocytes: 579 {cells}/uL (ref 200–950)
Basophils Absolute: 39 {cells}/uL (ref 0–200)
Basophils Relative: 0.6 %
Eosinophils Absolute: 254 {cells}/uL (ref 15–500)
Eosinophils Relative: 3.9 %
HCT: 43.2 % (ref 35.0–45.0)
Hemoglobin: 13.9 g/dL (ref 11.7–15.5)
MCH: 28.6 pg (ref 27.0–33.0)
MCHC: 32.2 g/dL (ref 32.0–36.0)
MCV: 88.9 fL (ref 80.0–100.0)
MPV: 9.6 fL (ref 7.5–12.5)
Monocytes Relative: 8.9 %
Neutro Abs: 3939 {cells}/uL (ref 1500–7800)
Neutrophils Relative %: 60.6 %
Platelets: 265 Thousand/uL (ref 140–400)
RBC: 4.86 Million/uL (ref 3.80–5.10)
RDW: 13.8 % (ref 11.0–15.0)
Total Lymphocyte: 26 %
WBC: 6.5 Thousand/uL (ref 3.8–10.8)

## 2024-07-30 LAB — LIPID PANEL
Cholesterol: 146 mg/dL (ref <200)
HDL: 48 mg/dL — ABNORMAL LOW (ref >=50)
LDL Cholesterol (Calc): 82 mg/dL
Non-HDL Cholesterol (Calc): 98 mg/dL (ref <130)
Total CHOL/HDL Ratio: 3 (calc) (ref <5.0)
Triglycerides: 84 mg/dL (ref <150)

## 2024-07-30 LAB — COMPREHENSIVE METABOLIC PANEL WITH GFR
AG Ratio: 1.5 (calc) (ref 1.0–2.5)
ALT: 26 U/L (ref 6–29)
AST: 21 U/L (ref 10–35)
Albumin: 4.3 g/dL (ref 3.6–5.1)
Alkaline phosphatase (APISO): 94 U/L (ref 37–153)
BUN: 11 mg/dL (ref 7–25)
CO2: 27 mmol/L (ref 20–32)
Calcium: 9.2 mg/dL (ref 8.6–10.4)
Chloride: 104 mmol/L (ref 98–110)
Creat: 0.68 mg/dL (ref 0.50–1.03)
Globulin: 2.8 g/dL (ref 1.9–3.7)
Glucose, Bld: 82 mg/dL (ref 65–99)
Potassium: 4.6 mmol/L (ref 3.5–5.3)
Sodium: 139 mmol/L (ref 135–146)
Total Bilirubin: 0.6 mg/dL (ref 0.2–1.2)
Total Protein: 7.1 g/dL (ref 6.1–8.1)
eGFR: 102 mL/min/1.73m2 (ref >=60)

## 2024-07-30 LAB — VITAMIN D 25 HYDROXY (VIT D DEFICIENCY, FRACTURES): Vit D, 25-Hydroxy: 44 ng/mL (ref 30–100)

## 2024-07-30 LAB — HEMOGLOBIN A1C
Hgb A1c MFr Bld: 5.1 % (ref <5.7)
Mean Plasma Glucose: 100 mg/dL
eAG (mmol/L): 5.5 mmol/L

## 2024-07-30 LAB — TSH: TSH: 2.94 m[IU]/L (ref 0.40–4.50)

## 2024-07-31 ENCOUNTER — Ambulatory Visit: Payer: Self-pay | Admitting: Allergy & Immunology

## 2024-07-31 ENCOUNTER — Ambulatory Visit: Admitting: Gastroenterology

## 2024-07-31 ENCOUNTER — Other Ambulatory Visit: Payer: Self-pay

## 2024-07-31 ENCOUNTER — Encounter: Payer: Self-pay | Admitting: Gastroenterology

## 2024-07-31 VITALS — BP 112/70 | HR 101 | Ht 64.0 in | Wt 321.2 lb

## 2024-07-31 DIAGNOSIS — K219 Gastro-esophageal reflux disease without esophagitis: Secondary | ICD-10-CM | POA: Diagnosis not present

## 2024-07-31 DIAGNOSIS — K5904 Chronic idiopathic constipation: Secondary | ICD-10-CM

## 2024-07-31 DIAGNOSIS — Z91014 Allergy to mammalian meats: Secondary | ICD-10-CM

## 2024-07-31 MED ORDER — LUBIPROSTONE 8 MCG PO CAPS: 8.0000 ug | ORAL_CAPSULE | Freq: Two times a day (BID) | ORAL | 2 refills | Status: AC

## 2024-07-31 NOTE — Patient Instructions (Addendum)
 GERD  Recommend GERD diet  Reflux gourmet over the counter    Constipation Recommend high fiber diet Will use openevidence to see if medication available   _______________________________________________________  If your blood pressure at your visit was 140/90 or greater, please contact your primary care physician to follow up on this.  _______________________________________________________  If you are age 56 or older, your body mass index should be between 23-30. Your Body mass index is 55.14 kg/m. If this is out of the aforementioned range listed, please consider follow up with your Primary Care Provider.  If you are age 56 or younger, your body mass index should be between 19-25. Your Body mass index is 55.14 kg/m. If this is out of the aformentioned range listed, please consider follow up with your Primary Care Provider.   ________________________________________________________  The [facility] providers would like to encourage you to use MYCHART to communicate with providers for non-urgent requests or questions.  Due to long hold times on the telephone, sending your provider a message by [facility] may be a faster and more efficient way to get a response.  Please allow 48 business hours for a response.  Please remember that this is for non-urgent requests.  _______________________________________________________  [facility] is using a team-based approach to care.  Your team is made up of your doctor and two to three APPS. Our APPS (Nurse Practitioners and Physician Assistants) work with your physician to ensure care continuity for you. They are fully qualified to address your health concerns and develop a treatment plan. They communicate directly with your gastroenterologist to care for you. Seeing the Advanced Practice Practitioners on your physician's team can help you by facilitating care more promptly, often allowing for earlier appointments, access to diagnostic  testing, procedures, and other specialty referrals.   Thank you for trusting me with your gastrointestinal care. Deanna May, FNP-C

## 2024-07-31 NOTE — Progress Notes (Signed)
 Agree with assessment and plan as outlined.

## 2024-07-31 NOTE — Progress Notes (Signed)
 Chief Complaint: follow-up, constipation Primary GI Doctor: Dr. Federico  HPI:58 year old female with history of rheumatoid arthritis, obesity, GERD, and anxiety presents with constipation   Patient last seen in GI office on 05/14/24 by Dr. Federico for constipation.  Interval History    Patient presents for follow-up on constipation.  Dr. Federico had prescribed patient Linzess  back in July however patient was unable to take due to it containing mammal.  She inquires today about alternative that vegan and provided me a website recommended by her rheumatoid doctor called open evidence which provides list of medications without animal protein.     Patient reports she recently has had issues with dehydration and on multiple antihistamines which seems to exacerbate the constipation.  Patient reports on Tuesday she had an episode of bad diarrhea after fasting that since then has resolved.  Patient has continued with issues with constipation with some straining. She does take OTC Magnesium complex at night which helps. She states having a bowel movement is like a olympic event she will have anxiety not going, gets sweaty, and heart rate goes up.   Concerns for mast cell activation syndrome, seeking alternative treatment options.  Patient started on amitriptyline  25 mg po daily in September which she feels Anelly Samarin help with some of the anxiety.  She also had GERD and uses famotidine prn. She notes it was worse yesterday when she was started on steroid taper for RA.   Wt Readings from Last 3 Encounters:  07/31/24 (!) 321 lb 4 oz (145.7 kg)  07/29/24 (!) 323 lb (146.5 kg)  07/23/24 (!) 326 lb (147.9 kg)    Past Medical History:  Diagnosis Date   Abnormal Pap smear of cervix    yrs ago   Adenomatous colon polyp    Allergy     seasonal    Anemia    past hx as young woman    Anxiety 07/16/2021   Asthma 11/24   Bursitis of shoulder, right 2021   Cataract    forming    Condyloma    Depression  02/2008   GERD (gastroesophageal reflux disease)    Hemorrhoids    Morbid obesity (HCC)    Pneumonia    hx of    Psoriasis 2025   Rheumatoid arthritis (HCC) 2005   STD (sexually transmitted disease)    + trich   Tubal ectopic pregnancy    times 2    Past Surgical History:  Procedure Laterality Date   ANKLE SURGERY     CHOLECYSTECTOMY  04/25/2007   COLONOSCOPY N/A 03/24/2015   MFM:ojmhz external hemorrhoids/moderate sized internal hemorrhoids/moderate diverticulosis in the sigmoid colon   COLONOSCOPY     COLONOSCOPY WITH PROPOFOL  N/A 03/01/2023   Procedure: COLONOSCOPY WITH PROPOFOL ;  Surgeon: Federico Rosario BROCKS, MD;  Location: [facility];  Service: Gastroenterology;  Laterality: N/A;   HEMORRHOID BANDING N/A 03/24/2015   Procedure: HEMORRHOID BANDING;  Surgeon: Margo LITTIE Haddock, MD;  Location: [facility];  Service: Endoscopy;  Laterality: N/A;   HERNIA REPAIR  03/2018   LAPAROSCOPIC GASTRIC SLEEVE RESECTION N/A 08/13/2017   Procedure: LAPAROSCOPIC GASTRIC SLEEVE RESECTION, UPPER ENDOSCOPY HIATAL HERNIA REPAIR;  Surgeon: Kinsinger, Herlene Righter, MD;  Location: [facility];  Service: General;  Laterality: N/A;   POLYPECTOMY     POLYPECTOMY  03/01/2023   Procedure: POLYPECTOMY;  Surgeon: Federico Rosario BROCKS, MD;  Location: [facility];  Service: Gastroenterology;;   TOTAL ABDOMINAL HYSTERECTOMY  04/25/2007   BSO   WISDOM TOOTH EXTRACTION  Current Outpatient Medications  Medication Sig Dispense Refill   Albuterol-Budesonide (AIRSUPRA ) 90-80 MCG/ACT AERO Inhale 2 puffs into the lungs every 4 (four) hours as needed. 10.7 g 5   amitriptyline  (ELAVIL ) 25 MG tablet Take 1 tablet (25 mg total) by mouth at bedtime. 90 tablet 0   azelastine  (ASTELIN ) 0.1 % nasal spray Place 1 spray into both nostrils daily. Use in each nostril as directed 30 mL 5   budesonide-glycopyrrolate-formoterol (BREZTRI  AEROSPHERE) 160-9-4.8 MCG/ACT AERO inhaler Inhale 2 puffs into the lungs 2 (two) times daily. 32.1 g 1    Cetirizine  HCl (ZYRTEC  ALLERGY  CHILDRENS PO) Take 10 mg by mouth.     EPINEPHRINE  0.3 mg/0.3 mL IJ SOAJ injection INJECT 1 PEN INTRAMUSCULARLY AS  NEEDED FOR ALLERGIC RESPONSE AS  DIRECTED BY MD. SEEK MEDICAL  HELP AFTER USE 2 each 1   fluticasone  (FLONASE ) 50 MCG/ACT nasal spray USE 2 SPRAYS IN BOTH NOSTRILS  DAILY 48 g 2   hydrocortisone  (ANUSOL -HC) 2.5 % rectal cream Place 1 Application rectally 2 (two) times daily. 30 g 1   hydroxychloroquine (PLAQUENIL) 200 MG tablet 2 tablets Orally daily; Duration: 30 days     LUTEIN PO Take by mouth.     Milk Thistle 1000 MG CAPS Take 1,000 mg by mouth daily.     montelukast  (SINGULAIR ) 10 MG tablet TAKE 1 TABLET BY MOUTH DAILY AT  BEDTIME 90 tablet 3   Olopatadine  HCl 0.2 % SOLN Apply 1 drop to eye daily as needed (itchy watery eyes). 2.5 mL 5   OPZELURA  1.5 % CREA APPLY TOPICALLY TWICE DAILY ON YOUR HANDS 60 g 4   OVER THE COUNTER MEDICATION Take 100 mg by mouth 2 (two) times daily. CBD capsules     predniSONE  (STERAPRED UNI-PAK 48 TAB) 5 MG (48) TBPK tablet Take 5 mg by mouth as needed (for alpha-gal flare).     VITAMIN D  PO Take by mouth.     No current facility-administered medications for this visit.    Allergies as of 07/31/2024 - Review Complete 07/31/2024  Allergen Reaction Noted   Alpha-gal Anaphylaxis 09/28/2023    Family History  Problem Relation Age of Onset   Cancer Father    Cirrhosis Father    Esophageal cancer Father    Alcohol abuse Father    Obesity Father    Atrial fibrillation Mother    Alcohol abuse Mother    Heart disease Mother    Obesity Mother    Anxiety disorder Sister    Colon cancer Neg Hx    Colon polyps Neg Hx    Rectal cancer Neg Hx    Stomach cancer Neg Hx     Review of Systems:    Constitutional: No weight loss, fever, chills, weakness or fatigue HEENT: Eyes: No change in vision               Ears, Nose, Throat:  No change in hearing or congestion Skin: No rash or itching Cardiovascular: No  chest pain, chest pressure or palpitations   Respiratory: No SOB or cough Gastrointestinal: See HPI and otherwise negative Genitourinary: No dysuria or change in urinary frequency Neurological: No headache, dizziness or syncope Musculoskeletal: No new muscle or joint pain Hematologic: No bleeding or bruising Psychiatric: No history of depression or anxiety    Physical Exam:  Vital signs: BP 112/70   Pulse (!) 101   Ht 5' 4 (1.626 m)   Wt (!) 321 lb 4 oz (145.7 kg)   LMP 04/25/2007  BMI 55.14 kg/m   Constitutional:   Pleasant female appears to be in NAD, Well developed, Well nourished, alert and cooperative Throat: Oral cavity and pharynx without inflammation, swelling or lesion.  Respiratory: Respirations even and unlabored. Lungs clear to auscultation bilaterally.   No wheezes, crackles, or rhonchi.  Cardiovascular: Normal S1, S2. Regular rate and rhythm. No peripheral edema, cyanosis or pallor.  Gastrointestinal:  Soft, nondistended, nontender. No rebound or guarding. Normal bowel sounds. No appreciable masses or hepatomegaly. Rectal:  Not performed.  Msk:  Symmetrical without gross deformities. Without edema, no deformity or joint abnormality.  Neurologic:  Alert and  oriented x4;  grossly normal neurologically.  Skin:   Dry and intact without significant lesions or rashes.  RELEVANT LABS AND IMAGING: CBC    Latest Ref Rng & Units 07/29/2024    9:57 AM 11/29/2022    9:31 AM 08/14/2017    5:50 AM  CBC  WBC 3.8 - 10.8 Thousand/uL 6.5  5.3  7.7   Hemoglobin 11.7 - 15.5 g/dL 86.0  86.2  88.0   Hematocrit 35.0 - 45.0 % 43.2  41.1  36.8   Platelets 140 - 400 Thousand/uL 265  234  217      CMP     Latest Ref Rng & Units 07/29/2024    9:57 AM 11/29/2022    9:31 AM 08/14/2017    5:50 AM  CMP  Glucose 65 - 99 mg/dL 82  85  99   BUN 7 - 25 mg/dL 11  15  9    Creatinine 0.50 - 1.03 mg/dL 9.31  9.40  9.36   Sodium 135 - 146 mmol/L 139  141  141   Potassium 3.5 - 5.3 mmol/L  4.6  4.5  4.5   Chloride 98 - 110 mmol/L 104  105  106   CO2 20 - 32 mmol/L 27  27  27    Calcium 8.6 - 10.4 mg/dL 9.2  9.0  8.9   Total Protein 6.1 - 8.1 g/dL 7.1  7.0  6.6   Total Bilirubin 0.2 - 1.2 mg/dL 0.6  0.8  0.9   Alkaline Phos 38 - 126 U/L   46   AST 10 - 35 U/L 21  19  45   ALT 6 - 29 U/L 26  22  68      Lab Results  Component Value Date   TSH 2.94 07/29/2024  Labs 04/2024: TTG IgA-neg, IgA-340, alpha gal panel- elevated, tryptase - normal  Labs 11/2022: CBC nml. CMP nml.    Labs 07/2023: Alpha gal positive.   Labs 08/2023: Tryptase nml.    Colonoscopy 03/01/23, recall 3 years The terminal ileum appeared normal. Four sessile polyps were found in the transverse colon, ascending colon and cecum. The polyps were 3 to 5 mm in size. These polyps were removed with a cold snare. Resection and retrieval were complete. Multiple diverticula were found in the sigmoid colon, descending colon and transverse colon. Non- bleeding internal hemorrhoids were found during retroflexion. Path: A. CECUM, ASCENDING AND TRANSVERSE COLON, POLYPECTOMY:  Tubular adenoma, 2 fragments  Serrated polyp compatible with hyperplastic polyp, 2 fragments  Negative for high-grade dysplasia and carcinoma    Assessment: Encounter Diagnoses  Name Primary?   Gastroesophageal reflux disease, unspecified whether esophagitis present Yes   Chronic idiopathic constipation    Alpha-gal syndrome   Constipation, occasional diarrhea Alpha gal syndrome GERD Patient presents for follow-up on constipation.  She reports she was unable to take the samples  of Linzess  due to containing mammal.  Did use open evidence and emailed patient long list of medications that she can consider.  Patient does have history of alpha gal syndrome and tries to avoid red meat. Patient also has history of reflux that recently has increased with starting steroid taper for her RA.  Patient uses Pepcid as needed.  Did mention she could try  Gourmet reflux as alternative.   Plan: -OTC Reflux gourmet prn -OTC Pepcid prn -Provide list of constipation meds for patient from openevidence  -Recall colonoscopy 02/2026 -follow-up as needed  Thank you for the courtesy of this consult. Please call me with any questions or concerns.   Verle Wheeling, FNP-C [facility] 07/31/2024, 12:08 PM  Cc: Kayla Jeoffrey RAMAN, FNP

## 2024-08-01 ENCOUNTER — Other Ambulatory Visit: Payer: Self-pay | Admitting: Allergy & Immunology

## 2024-08-01 MED ORDER — CROMOLYN SODIUM 100 MG/5ML PO CONC: 100.0000 mg | Freq: Three times a day (TID) | ORAL | 12 refills | Status: DC

## 2024-08-04 ENCOUNTER — Telehealth: Payer: Self-pay

## 2024-08-04 ENCOUNTER — Other Ambulatory Visit (HOSPITAL_COMMUNITY): Payer: Self-pay

## 2024-08-04 NOTE — Telephone Encounter (Signed)
 Pharmacy Patient Advocate Encounter   Received notification from CoverMyMeds that prior authorization for Lubiprostone 8MCG capsules is required/requested.   Insurance verification completed.   The patient is insured through [facility].   Per test claim: PA required; PA submitted to above mentioned insurance via Latent Key/confirmation #/EOC [id number] Status is pending

## 2024-08-05 ENCOUNTER — Other Ambulatory Visit (HOSPITAL_COMMUNITY): Payer: Self-pay

## 2024-08-05 NOTE — Telephone Encounter (Signed)
 Pharmacy Patient Advocate Encounter  Received notification from [facility] that Prior Authorization for Lubiprostone capsules has been APPROVED from 08-04-2024 to 08-04-2025. Ran test claim, Copay is $40.00 for a 30 day supply. This test claim was processed through [facility]- copay amounts may vary at other pharmacies due to pharmacy/plan contracts, or as the patient moves through the different stages of their insurance plan.   PA #/Case ID/Reference #: [id number]

## 2024-08-12 ENCOUNTER — Telehealth: Payer: Self-pay | Admitting: *Deleted

## 2024-08-12 NOTE — Telephone Encounter (Signed)
 Called patient and advised approval, copay card and submit to OPtum for Xolair. Will reach out once delivery is set to make appt to start therapy with appt and one hour wait

## 2024-08-13 ENCOUNTER — Encounter: Payer: Self-pay | Admitting: Allergy & Immunology

## 2024-08-15 NOTE — Telephone Encounter (Signed)
 Thanks, Tammy.

## 2024-08-16 ENCOUNTER — Other Ambulatory Visit: Payer: Self-pay | Admitting: Allergy & Immunology

## 2024-08-29 ENCOUNTER — Other Ambulatory Visit: Payer: Self-pay | Admitting: Family Medicine

## 2024-08-31 ENCOUNTER — Other Ambulatory Visit: Payer: Self-pay | Admitting: Allergy & Immunology

## 2024-09-05 ENCOUNTER — Ambulatory Visit

## 2024-09-05 VITALS — BP 110/72 | HR 76

## 2024-09-05 DIAGNOSIS — Z91018 Allergy to other foods: Secondary | ICD-10-CM

## 2024-09-05 MED ORDER — OMALIZUMAB 300 MG/2  ML ~~LOC~~ SOSY
300.0000 mg | PREFILLED_SYRINGE | SUBCUTANEOUS | Status: AC
  Administered 2024-09-05 – 2024-10-31 (×3): 300 mg via SUBCUTANEOUS

## 2024-09-05 NOTE — Progress Notes (Signed)
 Immunotherapy   Patient Details  Name: Sarah Dean MRN: [id number] Date of Birth: 08-17-1966  09/05/2024  Sarah Dean started injections for  Allergy  to other foods- Z91.018  Frequency: Every 28 days Epi-Pen:Epi-Pen Available  Consent signed and patient instructions given. Patient waited an hour in office after injection administration. Once her hour was up she stated she felt loopy and high ever since the injection. She stated she also felt some flutters. Vitals obtained and were stable. Dr.Joel Gallagher,MD evaluated the patient.    Sarah Dean 09/05/2024, 10:13 AM

## 2024-09-26 ENCOUNTER — Other Ambulatory Visit: Payer: Self-pay | Admitting: Allergy & Immunology

## 2024-10-03 ENCOUNTER — Ambulatory Visit

## 2024-10-03 DIAGNOSIS — Z91018 Allergy to other foods: Secondary | ICD-10-CM | POA: Diagnosis not present

## 2024-10-15 ENCOUNTER — Ambulatory Visit: Admitting: Allergy & Immunology

## 2024-10-31 ENCOUNTER — Ambulatory Visit: Admitting: Family Medicine

## 2024-10-31 ENCOUNTER — Encounter: Payer: Self-pay | Admitting: Family Medicine

## 2024-10-31 ENCOUNTER — Other Ambulatory Visit: Payer: Self-pay

## 2024-10-31 ENCOUNTER — Ambulatory Visit (INDEPENDENT_AMBULATORY_CARE_PROVIDER_SITE_OTHER)

## 2024-10-31 VITALS — BP 126/86 | HR 86 | Temp 97.9°F | Wt 334.6 lb

## 2024-10-31 DIAGNOSIS — J4540 Moderate persistent asthma, uncomplicated: Secondary | ICD-10-CM

## 2024-10-31 DIAGNOSIS — Z91018 Allergy to other foods: Secondary | ICD-10-CM

## 2024-10-31 DIAGNOSIS — J3089 Other allergic rhinitis: Secondary | ICD-10-CM | POA: Diagnosis not present

## 2024-10-31 DIAGNOSIS — L301 Dyshidrosis [pompholyx]: Secondary | ICD-10-CM

## 2024-10-31 DIAGNOSIS — J302 Other seasonal allergic rhinitis: Secondary | ICD-10-CM | POA: Diagnosis not present

## 2024-10-31 DIAGNOSIS — D8940 Mast cell activation, unspecified: Secondary | ICD-10-CM

## 2024-10-31 MED ORDER — CROMOLYN SODIUM 100 MG/5ML PO CONC: 200.0000 mg | Freq: Three times a day (TID) | ORAL | 12 refills | Status: AC

## 2024-10-31 MED ORDER — AIRSUPRA 90-80 MCG/ACT IN AERO: 2.0000 | INHALATION_SPRAY | RESPIRATORY_TRACT | 5 refills | Status: AC | PRN

## 2024-10-31 NOTE — Progress Notes (Signed)
 [address] [place] [address] Dept: [phone]  FOLLOW UP NOTE  Patient ID: Sarah Dean, female    DOB: 07-24-1966  Age: 59 y.o. MRN: [id number] Date of Office Visit: 10/31/2024  Assessment  Chief Complaint: Asthma and Follow-up  HPI LYVIA Dean is a 59 year old female who presents to the clinic for follow-up visit.  She was last seen in this clinic on 07/23/2024 by Dr. Iva for evaluation of asthma, allergic rhinitis, dyshidrotic eczema, and mast cell activation syndrome.  At today's visit, she reports her asthma has been moderately well-controlled with occasional wheeze occurring in the morning which she associates with postnasal drainage, and cough producing some thick mucus which she also associates with postnasal drainage.  She continues montelukast  10 mg once a day,Breztri  2 puffs twice a day, and uses Airsupra  infrequently.  Allergic rhinitis is reported as moderately well-controlled with symptoms including frequent episodes of postnasal drainage, and occasional rhinorrhea, nasal congestion, and sneeze.  She reports sneezing the most when in contact with dogs.  She continues azelastine  1 spray in each nostril once a day and Flonase  1 spray in each nostril once a day.  She stopped taking cetirizine  in November due to possible worsening of abdominal symptoms and diarrhea. Her last environmental allergy  skin testing on 12/25/2022 was positive to grass pollen, weed pollen, tree pollen, mold, dust mite, dog, cat, horse, feather, and mouse.  Reflux is reported as significantly improved since her last visit to this clinic with fewer episodes of diarrhea.  She continues cromolyn  5 mg 4 times a day with significant relief of symptoms.  She is interested in increasing cromolyn  dose for additional benefit.  Continues to avoid foods that aggravate her symptoms including mammalian products and xanthine gum.  Her EpiPen  set is up-to-date.  Atopic dermatitis is  reported as well-controlled with no red or open areas on her hands.  She continues a twice a day moisturizing routine and uses Opzelura  to red and itchy areas twice a day with relief of symptoms.  She continues to follow-up with Dr. Alm, dermatology specialist, as recommended.  Her current medications are listed in the chart.  Of note, heart murmur noted upon physical examination.  She denies chest pain, palpitations, or new episodes of shortness of breath.  She has visited Dr. Dorn Ross, cardiologist on 06/18/2024 for evaluation of dizziness.  Drug Allergies:  Allergies[1]  Physical Exam: BP 126/86 (BP Location: Left Arm, Patient Position: Sitting, Cuff Size: Large)   Pulse 86   Temp 97.9 F (36.6 C) (Temporal)   Wt (!) 334 lb 9.6 oz (151.8 kg)   LMP 04/25/2007   SpO2 98%   BMI 57.43 kg/m    Physical Exam Vitals reviewed.  Constitutional:      Appearance: Normal appearance.  HENT:     Head: Normocephalic and atraumatic.     Right Ear: Tympanic membrane normal.     Left Ear: Tympanic membrane normal.     Nose:     Comments: Bilateral nares normal.  Pharynx normal.  Ears normal.  Eyes normal.    Mouth/Throat:     Pharynx: Oropharynx is clear.  Eyes:     Conjunctiva/sclera: Conjunctivae normal.  Cardiovascular:     Rate and Rhythm: Normal rate and regular rhythm.     Heart sounds: No murmur heard. Pulmonary:     Effort: Pulmonary effort is normal.     Breath sounds: Normal breath sounds.     Comments: Lungs clear to auscultation  Musculoskeletal:        General: Normal range of motion.     Cervical back: Normal range of motion and neck supple.  Skin:    General: Skin is warm and dry.  Neurological:     Mental Status: She is alert and oriented to person, place, and time.  Psychiatric:        Mood and Affect: Mood normal.        Behavior: Behavior normal.        Thought Content: Thought content normal.        Judgment: Judgment normal.     Diagnostics:  FVC  2.70 which is 79% of predicted value. FEV1 2.15 which is 80% of predicted value. Spirometry indicates normal ventilatory function  Assessment and Plan: 1. Moderate persistent asthma, uncomplicated   2. Seasonal and perennial allergic rhinitis   3. Mast cell activation syndrome   4. Allergy  to other foods   5. Dyshidrotic eczema     Meds ordered this encounter  Medications   Albuterol-Budesonide (AIRSUPRA ) 90-80 MCG/ACT AERO    Sig: Inhale 2 puffs into the lungs every 4 (four) hours as needed.    Dispense:  10.7 g    Refill:  5    Co Pay card: BIN [id number] PCN [id number] GRP [id number] ID [id number]   cromolyn  (GASTROCROM ) 100 MG/5ML solution    Sig: Take 10 mLs (200 mg total) by mouth 4 (four) times daily -  before meals and at bedtime.    Dispense:  480 mL    Refill:  12    Patient Instructions  Asthma Continue Breztri  2 puffs twice a day with a spacer to prevent cough or wheeze Continue Airsupra  2 puffs as needed.  Do not use Airsupra  more than 12 puffs in a 24-hour time span  Allergic rhinitis Continue allergen avoidance measures directed toward grass pollen, weed pollen, tree pollen, mold, dust mite, dog, cat, horse, feather, and mouse as listed below Continue montelukast  10 mg once a day for allergy  symptom control Continue Flonase  2 sprays in each nostril once a day if needed for stuffy nose.  In the right nostril, point the applicator out toward the right ear. In the left nostril, point the applicator out toward the left ear Continue azelastine  2 sprays in each nostril up to twice a day if needed for nasal symptom Consider saline nasal rinses as needed for nasal symptoms. Use this before any medicated nasal sprays for best result Consider allergen immunotherapy if your symptoms are not well-controlled with the treatment plan as listed above  Dyshidrotic eczema Continue twice a day moisturizing routine Continue Opzelura  to red and itchy areas up to twice a day   Concern for mast  cell activation syndrome - with elevated leukotriene E4  - Continue with Quercetin daily.  - Continue with Luteolin daily. - Continue with famotidine 20mg  daily.  - Continue Singulair  (montelukast ) - Continue cetirizine  10mg  daily.  - Increase cromolyn  to 10 mL up to 4 times a day. Take 30 minutes before meals and before bedtime - Continue to avoid all of your triggering foods (especially black bean and hazelnut).  - Continue to avoid red meat due to elevated alpha gal,  black bean, hazelnut and all other foods that bother you - EpiPen  is up to date.   POTS Continue to follow-up with Dr. Alvan as recommended  Heart murmur We will refer you to cardiology for further evaluation of heart murmur  Call the clinic if this treatment  plan is not working well for you.  Follow up in 6 months or sooner if needed.  Return in about 6 months (around 04/30/2025), or if symptoms worsen or fail to improve.    Thank you for the opportunity to care for this patient.  Please do not hesitate to contact me with questions.  Arlean Mutter, FNP [facility]          [1]  Allergies Allergen Reactions   Alpha-Gal Anaphylaxis

## 2024-10-31 NOTE — Patient Instructions (Addendum)
 Asthma Continue Breztri  2 puffs twice a day with a spacer to prevent cough or wheeze Continue Airsupra  2 puffs as needed.  Do not use Airsupra  more than 12 puffs in a 24-hour time span  Allergic rhinitis Continue allergen avoidance measures directed toward grass pollen, weed pollen, tree pollen, mold, dust mite, dog, cat, horse, feather, and mouse as listed below Continue montelukast  10 mg once a day for allergy  symptom control Continue Flonase  2 sprays in each nostril once a day if needed for stuffy nose.  In the right nostril, point the applicator out toward the right ear. In the left nostril, point the applicator out toward the left ear Continue azelastine  2 sprays in each nostril up to twice a day if needed for nasal symptom Consider saline nasal rinses as needed for nasal symptoms. 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  - Continue with Luteolin daily. - Continue with famotidine 20mg  daily.  - Continue Singulair  (montelukast ) - Continue cetirizine  10mg  daily.  - Increase cromolyn  to 10 mL up to 4 times a day. Take 30 minutes before meals and before bedtime - Continue to avoid all of your triggering foods (especially black bean and hazelnut).  - Continue to avoid red meat due to elevated alpha gal,  black bean, hazelnut and all other foods that bother you - EpiPen  is up to date.   POTS Continue to follow-up with Dr. Alvan as recommended  Heart murmur We will refer you to cardiology for further evaluation of heart murmur  Call the clinic if this treatment plan is not working well for you.  Follow up in 6 months or sooner  if needed.  Reducing Pollen Exposure The [employer] , [employer] suggests the following steps to reduce your exposure to pollen during allergy  seasons. Do not hang sheets or clothing out to dry; pollen may collect on these items. Do not mow lawns or spend time around freshly cut grass; mowing stirs up pollen. Keep windows closed at night.  Keep car windows closed while driving. Minimize morning activities outdoors, a time when pollen counts are usually at their highest. Stay indoors as much as possible when pollen counts or humidity is high and on windy days when pollen tends to remain in the air longer. Use air conditioning when possible.  Many air conditioners have filters that trap the pollen spores. Use a HEPA room air filter to remove pollen form the indoor air you breathe.  Control of Mold Allergen Mold and fungi can grow on a variety of surfaces provided certain temperature and moisture conditions exist.  Outdoor molds grow on plants, decaying vegetation and soil.  The major outdoor mold, Alternaria and Cladosporium, are found in very high numbers during hot and dry conditions.  Generally, a late Summer - Fall peak is seen for common outdoor fungal spores.  Rain will temporarily lower outdoor mold spore count, but counts rise rapidly when the rainy period ends.  The most important indoor molds are Aspergillus and Penicillium.  Dark, humid and poorly ventilated basements are ideal sites for mold growth.  The next most common sites of mold growth are the bathroom  and the kitchen.  Outdoor [employer] Use air conditioning and keep windows closed Avoid exposure to decaying vegetation. Avoid leaf raking. Avoid grain handling. Consider wearing a face mask if working in moldy areas.  Indoor Mold Control Maintain humidity below 50%. Clean washable surfaces with 5% bleach solution. Remove sources e.g. Contaminated carpets.   Control of Dust Mite Allergen Dust  mites play a major role in allergic asthma and rhinitis. They occur in environments with high humidity wherever human skin is found. Dust mites absorb humidity from the atmosphere (ie, they do not drink) and feed on organic matter (including shed human and animal skin). Dust mites are a microscopic type of insect that you cannot see with the naked eye. High levels of dust mites have been detected from mattresses, pillows, carpets, upholstered furniture, bed covers, clothes, soft toys and any woven material. The principal allergen of the dust mite is found in its feces. A gram of dust may contain 1,000 mites and 250,000 fecal particles. Mite antigen is easily measured in the air during house cleaning activities. Dust mites do not bite and do not cause harm to humans, other than by triggering allergies/asthma.  Ways to decrease your exposure to dust mites in your home:  1. Encase mattresses, box springs and pillows with a mite-impermeable barrier or cover  2. Wash sheets, blankets and drapes weekly in hot water  (130 F) with detergent and dry them in a dryer on the hot setting.  3. Have the room cleaned frequently with a vacuum cleaner and a damp dust-mop. For carpeting or rugs, vacuuming with a vacuum cleaner equipped with a high-efficiency particulate air (HEPA) filter. The dust mite allergic individual should not be in a room which is being cleaned and should wait 1 hour after cleaning before going into the room.  4. Do not sleep on upholstered furniture (eg, couches).  5. If possible removing carpeting, upholstered furniture and drapery from the home is ideal. Horizontal blinds should be eliminated in the rooms where the person spends the most time (bedroom, study, television room). Washable vinyl, roller-type shades are optimal.  6. Remove all non-washable stuffed toys from the bedroom. Wash stuffed toys weekly like sheets and blankets above.  7. Reduce indoor humidity to less than 50%.  Inexpensive humidity monitors can be purchased at most hardware stores. Do not use a humidifier as can make the problem worse and are not recommended.  Control of Dog or Cat Allergen Avoidance is the best way to manage a dog or cat allergy . If you have a dog or cat and are allergic to dog or cats, consider removing the dog or cat from the home. If you have a dog or cat but dont want to find it a new home, or if your family wants a pet even though someone in the household is allergic, here are some strategies that may help keep symptoms at bay:  Keep the pet out of your bedroom and restrict it to only a few rooms. Be advised that keeping the dog or cat in only one room will not limit the allergens to that room. Dont pet, hug or kiss the dog or cat; if you do, wash your hands with soap and water . High-efficiency particulate air (HEPA) cleaners run continuously in a bedroom or living room can reduce allergen levels over time. Regular use of a high-efficiency vacuum cleaner or a central vacuum can reduce allergen levels. Giving your dog or cat a bath at least once a week  can reduce airborne allergen.

## 2024-10-31 NOTE — Addendum Note (Signed)
 Addended by: AZALEA, Brylyn Novakovich on: 10/31/2024 05:47 PM   Modules accepted: Orders

## 2024-11-03 ENCOUNTER — Encounter: Payer: Self-pay | Admitting: Family Medicine

## 2024-11-05 ENCOUNTER — Telehealth: Payer: Self-pay | Admitting: Family Medicine

## 2024-11-05 NOTE — Telephone Encounter (Signed)
 Sarah Dean is scheduled with Dr. Alvan for 4/21 at 9:20 am.

## 2024-12-03 ENCOUNTER — Ambulatory Visit

## 2024-12-25 ENCOUNTER — Ambulatory Visit (HOSPITAL_COMMUNITY): Admitting: Registered Nurse

## 2025-02-03 ENCOUNTER — Ambulatory Visit: Admitting: Cardiology

## 2025-02-04 ENCOUNTER — Ambulatory Visit: Admitting: Dermatology

## 2025-05-06 ENCOUNTER — Ambulatory Visit: Admitting: Allergy & Immunology

## 2025-07-22 ENCOUNTER — Encounter: Admitting: Family Medicine

## 2025-07-30 ENCOUNTER — Encounter: Admitting: Family Medicine
